# Patient Record
Sex: Female | Born: 1970
Health system: Southern US, Community
[De-identification: ages and names within clinical notes are randomized; demographics above are authoritative.]

## PROBLEM LIST (undated history)

## (undated) DIAGNOSIS — T7840XA Allergy, unspecified, initial encounter: Secondary | ICD-10-CM

## (undated) DIAGNOSIS — C50919 Malignant neoplasm of unspecified site of unspecified female breast: Secondary | ICD-10-CM

## (undated) DIAGNOSIS — N809 Endometriosis, unspecified: Secondary | ICD-10-CM

## (undated) DIAGNOSIS — Z923 Personal history of irradiation: Secondary | ICD-10-CM

## (undated) DIAGNOSIS — I10 Essential (primary) hypertension: Secondary | ICD-10-CM

## (undated) DIAGNOSIS — Z803 Family history of malignant neoplasm of breast: Secondary | ICD-10-CM

## (undated) DIAGNOSIS — N649 Disorder of breast, unspecified: Secondary | ICD-10-CM

## (undated) HISTORY — DX: Endometriosis, unspecified: N80.9

## (undated) HISTORY — DX: Malignant neoplasm of unspecified site of unspecified female breast: C50.919

## (undated) HISTORY — PX: BREAST SURGERY: SHX581

## (undated) HISTORY — DX: Essential (primary) hypertension: I10

## (undated) HISTORY — DX: Family history of malignant neoplasm of breast: Z80.3

## (undated) HISTORY — DX: Disorder of breast, unspecified: N64.9

## (undated) HISTORY — DX: Allergy, unspecified, initial encounter: T78.40XA

---

## 1997-07-20 HISTORY — PX: TUBAL LIGATION: SHX77

## 2007-09-12 ENCOUNTER — Other Ambulatory Visit: Admission: RE | Admit: 2007-09-12 | Discharge: 2007-09-12 | Payer: Self-pay | Admitting: Obstetrics and Gynecology

## 2010-11-28 ENCOUNTER — Other Ambulatory Visit (HOSPITAL_COMMUNITY)
Admission: RE | Admit: 2010-11-28 | Discharge: 2010-11-28 | Disposition: A | Discharge status: Home / Self Care | Payer: Self-pay | Source: Ambulatory Visit | Attending: Obstetrics & Gynecology | Admitting: Obstetrics & Gynecology

## 2010-11-28 ENCOUNTER — Other Ambulatory Visit: Payer: Self-pay | Admitting: Obstetrics & Gynecology

## 2010-11-28 DIAGNOSIS — Z01419 Encounter for gynecological examination (general) (routine) without abnormal findings: Secondary | ICD-10-CM | POA: Insufficient documentation

## 2011-02-16 ENCOUNTER — Encounter (INDEPENDENT_AMBULATORY_CARE_PROVIDER_SITE_OTHER): Payer: Self-pay | Admitting: *Deleted

## 2011-02-20 ENCOUNTER — Encounter (INDEPENDENT_AMBULATORY_CARE_PROVIDER_SITE_OTHER): Payer: Self-pay | Admitting: *Deleted

## 2011-03-31 ENCOUNTER — Encounter (INDEPENDENT_AMBULATORY_CARE_PROVIDER_SITE_OTHER): Payer: Self-pay | Admitting: *Deleted

## 2011-04-01 ENCOUNTER — Encounter (INDEPENDENT_AMBULATORY_CARE_PROVIDER_SITE_OTHER): Payer: Self-pay | Admitting: *Deleted

## 2011-04-02 ENCOUNTER — Encounter (INDEPENDENT_AMBULATORY_CARE_PROVIDER_SITE_OTHER): Payer: Self-pay | Admitting: *Deleted

## 2011-04-09 ENCOUNTER — Encounter (INDEPENDENT_AMBULATORY_CARE_PROVIDER_SITE_OTHER): Payer: Self-pay | Admitting: *Deleted

## 2011-04-20 ENCOUNTER — Encounter (INDEPENDENT_AMBULATORY_CARE_PROVIDER_SITE_OTHER): Payer: Self-pay | Admitting: *Deleted

## 2011-04-20 NOTE — Telephone Encounter (Signed)
This encounter was created in error - please disregard.

## 2011-04-22 ENCOUNTER — Encounter (INDEPENDENT_AMBULATORY_CARE_PROVIDER_SITE_OTHER): Payer: Self-pay | Admitting: *Deleted

## 2011-04-22 NOTE — Telephone Encounter (Signed)
This encounter was created in error - please disregard.

## 2011-04-23 ENCOUNTER — Encounter (INDEPENDENT_AMBULATORY_CARE_PROVIDER_SITE_OTHER): Payer: Self-pay | Admitting: *Deleted

## 2011-05-05 ENCOUNTER — Encounter (INDEPENDENT_AMBULATORY_CARE_PROVIDER_SITE_OTHER): Payer: Self-pay | Admitting: *Deleted

## 2011-05-05 NOTE — Progress Notes (Signed)
This encounter was created in error - please disregard.

## 2011-05-18 ENCOUNTER — Encounter (INDEPENDENT_AMBULATORY_CARE_PROVIDER_SITE_OTHER): Payer: Self-pay | Admitting: *Deleted

## 2011-05-22 ENCOUNTER — Encounter (INDEPENDENT_AMBULATORY_CARE_PROVIDER_SITE_OTHER): Payer: Self-pay | Admitting: *Deleted

## 2011-05-22 NOTE — Progress Notes (Signed)
This encounter was created in error - please disregard.

## 2011-05-27 ENCOUNTER — Encounter (INDEPENDENT_AMBULATORY_CARE_PROVIDER_SITE_OTHER): Payer: Self-pay | Admitting: *Deleted

## 2011-06-02 ENCOUNTER — Encounter (INDEPENDENT_AMBULATORY_CARE_PROVIDER_SITE_OTHER): Payer: Self-pay | Admitting: *Deleted

## 2011-06-02 NOTE — Telephone Encounter (Signed)
error    This encounter was created in error - please disregard.

## 2011-06-08 ENCOUNTER — Encounter (INDEPENDENT_AMBULATORY_CARE_PROVIDER_SITE_OTHER): Payer: Self-pay | Admitting: *Deleted

## 2011-06-15 ENCOUNTER — Encounter (INDEPENDENT_AMBULATORY_CARE_PROVIDER_SITE_OTHER): Payer: Self-pay | Admitting: *Deleted

## 2011-08-03 ENCOUNTER — Encounter (INDEPENDENT_AMBULATORY_CARE_PROVIDER_SITE_OTHER): Payer: Self-pay | Admitting: *Deleted

## 2011-08-03 NOTE — Progress Notes (Signed)
This encounter was created in error - please disregard.

## 2011-08-05 ENCOUNTER — Encounter (INDEPENDENT_AMBULATORY_CARE_PROVIDER_SITE_OTHER): Payer: Self-pay | Admitting: *Deleted

## 2011-08-05 NOTE — Telephone Encounter (Signed)
This encounter was created in error - please disregard.

## 2011-09-08 ENCOUNTER — Encounter (INDEPENDENT_AMBULATORY_CARE_PROVIDER_SITE_OTHER): Payer: Self-pay | Admitting: *Deleted

## 2011-09-14 ENCOUNTER — Encounter (INDEPENDENT_AMBULATORY_CARE_PROVIDER_SITE_OTHER): Payer: Self-pay | Admitting: *Deleted

## 2011-09-17 ENCOUNTER — Encounter (INDEPENDENT_AMBULATORY_CARE_PROVIDER_SITE_OTHER): Payer: Self-pay | Admitting: *Deleted

## 2011-11-12 ENCOUNTER — Encounter (INDEPENDENT_AMBULATORY_CARE_PROVIDER_SITE_OTHER): Payer: Self-pay | Admitting: *Deleted

## 2011-11-26 ENCOUNTER — Encounter (INDEPENDENT_AMBULATORY_CARE_PROVIDER_SITE_OTHER): Payer: Self-pay | Admitting: *Deleted

## 2011-11-27 ENCOUNTER — Encounter (INDEPENDENT_AMBULATORY_CARE_PROVIDER_SITE_OTHER): Payer: Self-pay | Admitting: *Deleted

## 2011-12-09 ENCOUNTER — Encounter (INDEPENDENT_AMBULATORY_CARE_PROVIDER_SITE_OTHER): Payer: Self-pay | Admitting: *Deleted

## 2012-01-11 ENCOUNTER — Encounter (INDEPENDENT_AMBULATORY_CARE_PROVIDER_SITE_OTHER): Payer: Self-pay | Admitting: *Deleted

## 2012-01-11 DIAGNOSIS — R109 Unspecified abdominal pain: Secondary | ICD-10-CM

## 2012-01-11 NOTE — Progress Notes (Signed)
This encounter was created in error - please disregard.

## 2012-01-13 ENCOUNTER — Encounter (INDEPENDENT_AMBULATORY_CARE_PROVIDER_SITE_OTHER): Payer: Self-pay | Admitting: Internal Medicine

## 2012-01-13 DIAGNOSIS — R140 Abdominal distension (gaseous): Secondary | ICD-10-CM

## 2012-01-13 NOTE — Progress Notes (Signed)
This encounter was created in error - please disregard.

## 2012-06-02 ENCOUNTER — Encounter (INDEPENDENT_AMBULATORY_CARE_PROVIDER_SITE_OTHER): Payer: Self-pay | Admitting: *Deleted

## 2012-06-20 ENCOUNTER — Encounter (INDEPENDENT_AMBULATORY_CARE_PROVIDER_SITE_OTHER): Payer: Self-pay | Admitting: *Deleted

## 2012-07-05 ENCOUNTER — Encounter (INDEPENDENT_AMBULATORY_CARE_PROVIDER_SITE_OTHER): Payer: Self-pay | Admitting: *Deleted

## 2012-07-06 ENCOUNTER — Encounter (INDEPENDENT_AMBULATORY_CARE_PROVIDER_SITE_OTHER): Payer: Self-pay | Admitting: *Deleted

## 2012-07-21 ENCOUNTER — Encounter (INDEPENDENT_AMBULATORY_CARE_PROVIDER_SITE_OTHER): Payer: Self-pay | Admitting: *Deleted

## 2012-07-27 ENCOUNTER — Encounter (INDEPENDENT_AMBULATORY_CARE_PROVIDER_SITE_OTHER): Payer: Self-pay | Admitting: *Deleted

## 2012-07-28 ENCOUNTER — Encounter (INDEPENDENT_AMBULATORY_CARE_PROVIDER_SITE_OTHER): Payer: Self-pay | Admitting: *Deleted

## 2012-08-04 ENCOUNTER — Encounter (INDEPENDENT_AMBULATORY_CARE_PROVIDER_SITE_OTHER): Payer: Self-pay | Admitting: *Deleted

## 2012-08-24 ENCOUNTER — Encounter (INDEPENDENT_AMBULATORY_CARE_PROVIDER_SITE_OTHER): Payer: Self-pay | Admitting: *Deleted

## 2012-08-29 ENCOUNTER — Encounter (INDEPENDENT_AMBULATORY_CARE_PROVIDER_SITE_OTHER): Payer: Self-pay | Admitting: *Deleted

## 2012-09-28 ENCOUNTER — Encounter (INDEPENDENT_AMBULATORY_CARE_PROVIDER_SITE_OTHER): Payer: Self-pay | Admitting: *Deleted

## 2012-10-04 ENCOUNTER — Encounter (INDEPENDENT_AMBULATORY_CARE_PROVIDER_SITE_OTHER): Payer: Self-pay | Admitting: *Deleted

## 2012-10-04 NOTE — Telephone Encounter (Signed)
This encounter was created in error - please disregard.

## 2012-10-31 ENCOUNTER — Encounter (INDEPENDENT_AMBULATORY_CARE_PROVIDER_SITE_OTHER): Payer: Self-pay | Admitting: *Deleted

## 2012-11-18 ENCOUNTER — Encounter (INDEPENDENT_AMBULATORY_CARE_PROVIDER_SITE_OTHER): Payer: Self-pay | Admitting: *Deleted

## 2012-11-23 ENCOUNTER — Encounter: Payer: Self-pay | Admitting: *Deleted

## 2012-11-23 DIAGNOSIS — N809 Endometriosis, unspecified: Secondary | ICD-10-CM

## 2012-11-23 DIAGNOSIS — I10 Essential (primary) hypertension: Secondary | ICD-10-CM

## 2012-11-24 ENCOUNTER — Encounter: Payer: Self-pay | Admitting: Obstetrics & Gynecology

## 2012-11-24 ENCOUNTER — Ambulatory Visit (INDEPENDENT_AMBULATORY_CARE_PROVIDER_SITE_OTHER): Payer: 59 | Admitting: Obstetrics & Gynecology

## 2012-11-24 VITALS — BP 140/80 | Ht 62.0 in | Wt 168.0 lb

## 2012-11-24 DIAGNOSIS — Z01419 Encounter for gynecological examination (general) (routine) without abnormal findings: Secondary | ICD-10-CM

## 2012-11-24 NOTE — Progress Notes (Signed)
Patient ID: Sara Freeman, female   DOB: 1970/08/04, 42 y.o.   MRN: [id number] Subjective:     Sara Freeman is a 42 y.o. female here for a routine exam.  Patient's last menstrual period was 11/09/2012. G1P1 Current complaints: none.  Personal health questionnaire reviewed: yes.   Gynecologic History Patient's last menstrual period was 11/09/2012. Contraception: tubal ligation Last Pap: 2013. Results were: normal Last mammogram: never. Results were: na  Obstetric History OB History   Grav Para Term Preterm Abortions TAB SAB Ect Mult Living   1 1             # Outc Date GA Lbr Len/2nd Wgt Sex Del Anes PTL Lv   1 PAR                The following portions of the patient's history were reviewed and updated as appropriate: allergies, current medications, past family history, past medical history, past social history, past surgical history and problem list.  Review of Systems  Review of Systems  Constitutional: Negative for fever, chills, weight loss, malaise/fatigue and diaphoresis.  HENT: Negative for hearing loss, ear pain, nosebleeds, congestion, sore throat, neck pain, tinnitus and ear discharge.   Eyes: Negative for blurred vision, double vision, photophobia, pain, discharge and redness.  Respiratory: Negative for cough, hemoptysis, sputum production, shortness of breath, wheezing and stridor.   Cardiovascular: Negative for chest pain, palpitations, orthopnea, claudication, leg swelling and PND.  Gastrointestinal: negative for abdominal pain. Negative for heartburn, nausea, vomiting, diarrhea, constipation, blood in stool and melena.  Genitourinary: Negative for dysuria, urgency, frequency, hematuria and flank pain.  Musculoskeletal: Negative for myalgias, back pain, joint pain and falls.  Skin: Negative for itching and rash.  Neurological: Negative for dizziness, tingling, tremors, sensory change, speech change, focal weakness, seizures, loss of consciousness, weakness and  headaches.  Endo/Heme/Allergies: Negative for environmental allergies and polydipsia. Does not bruise/bleed easily.  Psychiatric/Behavioral: Negative for depression, suicidal ideas, hallucinations, memory loss and substance abuse. The patient is not nervous/anxious and does not have insomnia.        Objective:    Physical Exam  Vitals reviewed. Constitutional: She is oriented to person, place, and time. She appears well-developed and well-nourished.  HENT:  Head: Normocephalic and atraumatic.        Right Ear: External ear normal.  Left Ear: External ear normal.  Nose: Nose normal.  Mouth/Throat: Oropharynx is clear and moist.  Eyes: Conjunctivae and EOM are normal. Pupils are equal, round, and reactive to light. Right eye exhibits no discharge. Left eye exhibits no discharge. No scleral icterus.  Neck: Normal range of motion. Neck supple. No tracheal deviation present. No thyromegaly present.  Cardiovascular: Normal rate, regular rhythm, normal heart sounds and intact distal pulses.  Exam reveals no gallop and no friction rub.   No murmur heard. Respiratory: Effort normal and breath sounds normal. No respiratory distress. She has no wheezes. She has no rales. She exhibits no tenderness.  GI: Soft. Bowel sounds are normal. She exhibits no distension and no mass. There is no tenderness. There is no rebound and no guarding.  Genitourinary:       Vulva is normal without lesions Vagina is pink moist without discharge Cervix normal in appearance and pap is done Uterus is normal size shape and contour Adnexa is negative with normal sized ovaries   Musculoskeletal: Normal range of motion. She exhibits no edema and no tenderness.  Neurological: She is alert and oriented to person,  place, and time. She has normal reflexes. She displays normal reflexes. No cranial nerve deficit. She exhibits normal muscle tone. Coordination normal.  Skin: Skin is warm and dry. No rash noted. No erythema. No  pallor.  Psychiatric: She has a normal mood and affect. Her behavior is normal. Judgment and thought content normal.       Assessment:    Healthy female exam.    Plan:    Mammogram ordered. Follow up in: 1 year.

## 2012-11-24 NOTE — Patient Instructions (Signed)
Mammography Mammography is an X-ray of the breasts to look for changes that are not normal. The X-ray image is called a mammogram. This procedure can screen for breast cancer, can detect cancer early, and can diagnose cancer.  LET YOUR CAREGIVER KNOW ABOUT:  Breast implants.  Previous breast disease, biopsy, or surgery.  If you are breastfeeding.  Medicines taken, including vitamins, herbs, eyedrops, over-the-counter medicines, and creams.  Use of steroids (by mouth or creams).  Possibility of pregnancy, if this applies. RISKS AND COMPLICATIONS  Exposure to radiation, but at very low levels.  The results may be misinterpreted.  The results may not be accurate.  Mammography may lead to further tests.  Mammography may not catch certain cancers. BEFORE THE PROCEDURE  Schedule your test about 7 days after your menstrual period. This is when your breasts are the least tender and have signs of hormone changes.  If you have had a mammography done at a different facility in the past, get the mammogram X-rays or have them sent to your current exam facility in order to compare them.  Wash your breasts and under your arms the day of the test.  Do not wear deodorants, perfumes, or powders anywhere on your body.  Wear clothes that you can change in and out of easily. PROCEDURE Relax as much as possible during the test. Any discomfort during the test will be very brief. The test should take less than 30 minutes. The following will happen:  You will undress from the waist up and put on a gown.  You will stand in front of the X-ray machine.  Each breast will be placed between 2 plastic or glass plates. The plates will compress your breast for a few seconds.  X-rays will be taken from different angles of the breast. AFTER THE PROCEDURE  The mammogram will be examined.  Depending on the quality of the images, you may need to repeat certain parts of the test.  Ask when your test  results will be ready. Make sure you get your test results.  You may resume normal activities. Document Released: 07/03/2000 Document Revised: 09/28/2011 Document Reviewed: 04/26/2011 [employer] Patient Information 2013 [employer]  

## 2013-05-25 ENCOUNTER — Other Ambulatory Visit: Payer: Self-pay

## 2013-06-20 ENCOUNTER — Telehealth (INDEPENDENT_AMBULATORY_CARE_PROVIDER_SITE_OTHER): Payer: Self-pay | Admitting: Internal Medicine

## 2013-06-20 DIAGNOSIS — J322 Chronic ethmoidal sinusitis: Secondary | ICD-10-CM

## 2013-06-20 MED ORDER — LEVOFLOXACIN 250 MG PO TABS: 250.0000 mg | ORAL_TABLET | Freq: Two times a day (BID) | ORAL | Status: DC

## 2013-06-20 NOTE — Telephone Encounter (Signed)
rx eprescribed  

## 2013-12-01 ENCOUNTER — Encounter: Payer: Self-pay | Admitting: Obstetrics & Gynecology

## 2013-12-01 ENCOUNTER — Ambulatory Visit (INDEPENDENT_AMBULATORY_CARE_PROVIDER_SITE_OTHER): Payer: 59 | Admitting: Obstetrics & Gynecology

## 2013-12-01 ENCOUNTER — Other Ambulatory Visit (HOSPITAL_COMMUNITY)
Admission: RE | Admit: 2013-12-01 | Discharge: 2013-12-01 | Disposition: A | Discharge status: Home / Self Care | Payer: 59 | Source: Ambulatory Visit | Attending: Obstetrics & Gynecology | Admitting: Obstetrics & Gynecology

## 2013-12-01 VITALS — BP 140/90 | Ht 62.0 in | Wt 170.0 lb

## 2013-12-01 DIAGNOSIS — Z01419 Encounter for gynecological examination (general) (routine) without abnormal findings: Secondary | ICD-10-CM | POA: Insufficient documentation

## 2013-12-01 DIAGNOSIS — Z1151 Encounter for screening for human papillomavirus (HPV): Secondary | ICD-10-CM | POA: Insufficient documentation

## 2013-12-01 NOTE — Progress Notes (Signed)
Patient ID: Sara Freeman, female   DOB: [birth year], 43 y.o.   MRN: [id number] Subjective:     Sara Freeman is a 43 y.o. female here for a routine exam.  Patient's last menstrual period was 11/08/2013. G1P1 Birth Control Method:  BTL Menstrual Calendar(currently): regular, no problems  Current complaints: none.   Current acute medical issues:  none   Recent Gynecologic History Patient's last menstrual period was 11/08/2013. Last Pap: 2014,  normal Last mammogram: never,    Past Medical History  Diagnosis Date  . Endometriosis   . Hypertension     Past Surgical History  Procedure Laterality Date  . Tubal ligation  1999    [facility]    OB History   Grav Para Term Preterm Abortions TAB SAB Ect Mult Living   1 1              History   Social History  . Marital Status: Married    Spouse Name: N/A    Number of Children: N/A  . Years of Education: N/A   Social History Main Topics  . Smoking status: Former Research scientist (life sciences)  . Smokeless tobacco: None  . Alcohol Use: None  . Drug Use: None  . Sexual Activity: Yes   Other Topics Concern  . None   Social History Narrative  . None    Family History  Problem Relation Age of Onset  . CAD Other   . Hypertension Other   . Stroke Other   . Thyroid disease Other   . Heart disease Other      Review of Systems  Review of Systems  Constitutional: Negative for fever, chills, weight loss, malaise/fatigue and diaphoresis.  HENT: Negative for hearing loss, ear pain, nosebleeds, congestion, sore throat, neck pain, tinnitus and ear discharge.   Eyes: Negative for blurred vision, double vision, photophobia, pain, discharge and redness.  Respiratory: Negative for cough, hemoptysis, sputum production, shortness of breath, wheezing and stridor.   Cardiovascular: Negative for chest pain, palpitations, orthopnea, claudication, leg swelling and PND.  Gastrointestinal: negative for abdominal pain. Negative for heartburn, nausea, vomiting,  diarrhea, constipation, blood in stool and melena.  Genitourinary: Negative for dysuria, urgency, frequency, hematuria and flank pain.  Musculoskeletal: Negative for myalgias, back pain, joint pain and falls.  Skin: Negative for itching and rash.  Neurological: Negative for dizziness, tingling, tremors, sensory change, speech change, focal weakness, seizures, loss of consciousness, weakness and headaches.  Endo/Heme/Allergies: Negative for environmental allergies and polydipsia. Does not bruise/bleed easily.  Psychiatric/Behavioral: Negative for depression, suicidal ideas, hallucinations, memory loss and substance abuse. The patient is not nervous/anxious and does not have insomnia.        Objective:    Physical Exam  Vitals reviewed. Constitutional: She is oriented to person, place, and time. She appears well-developed and well-nourished.  HENT:  Head: Normocephalic and atraumatic.        Right Ear: External ear normal.  Left Ear: External ear normal.  Nose: Nose normal.  Mouth/Throat: Oropharynx is clear and moist.  Eyes: Conjunctivae and EOM are normal. Pupils are equal, round, and reactive to light. Right eye exhibits no discharge. Left eye exhibits no discharge. No scleral icterus.  Neck: Normal range of motion. Neck supple. No tracheal deviation present. No thyromegaly present.  Cardiovascular: Normal rate, regular rhythm, normal heart sounds and intact distal pulses.  Exam reveals no gallop and no friction rub.   No murmur heard. Respiratory: Effort normal and breath sounds normal. No respiratory distress.  She has no wheezes. She has no rales. She exhibits no tenderness.  GI: Soft. Bowel sounds are normal. She exhibits no distension and no mass. There is no tenderness. There is no rebound and no guarding.  Genitourinary:  Breasts no masses skin changes or nipple changes bilaterally      Vulva is normal without lesions Vagina is pink moist without discharge Cervix normal in  appearance and pap is done Uterus is normal size shape and contour Adnexa is negative with normal sized ovaries   Musculoskeletal: Normal range of motion. She exhibits no edema and no tenderness.  Neurological: She is alert and oriented to person, place, and time. She has normal reflexes. She displays normal reflexes. No cranial nerve deficit. She exhibits normal muscle tone. Coordination normal.  Skin: Skin is warm and dry. No rash noted. No erythema. No pallor.  Psychiatric: She has a normal mood and affect. Her behavior is normal. Judgment and thought content normal.       Assessment:    Healthy female exam.    Plan:    Mammogram ordered. Follow up in: 1 year.

## 2014-05-04 ENCOUNTER — Other Ambulatory Visit: Payer: Self-pay

## 2014-05-21 ENCOUNTER — Encounter: Payer: Self-pay | Admitting: Obstetrics & Gynecology

## 2014-08-13 ENCOUNTER — Telehealth (INDEPENDENT_AMBULATORY_CARE_PROVIDER_SITE_OTHER): Payer: Self-pay | Admitting: Internal Medicine

## 2014-08-13 DIAGNOSIS — J322 Chronic ethmoidal sinusitis: Secondary | ICD-10-CM

## 2014-08-13 MED ORDER — LEVOFLOXACIN 250 MG PO TABS: 250.0000 mg | ORAL_TABLET | Freq: Two times a day (BID) | ORAL | Status: DC

## 2014-08-13 NOTE — Telephone Encounter (Signed)
Rx for Levaquin sent to pharmacy

## 2014-12-21 ENCOUNTER — Encounter: Payer: Self-pay | Admitting: Obstetrics & Gynecology

## 2014-12-21 ENCOUNTER — Ambulatory Visit (INDEPENDENT_AMBULATORY_CARE_PROVIDER_SITE_OTHER): Payer: 59 | Admitting: Obstetrics & Gynecology

## 2014-12-21 ENCOUNTER — Other Ambulatory Visit (HOSPITAL_COMMUNITY)
Admission: RE | Admit: 2014-12-21 | Discharge: 2014-12-21 | Disposition: A | Discharge status: Home / Self Care | Payer: 59 | Source: Ambulatory Visit | Attending: Obstetrics & Gynecology | Admitting: Obstetrics & Gynecology

## 2014-12-21 VITALS — BP 140/90 | HR 76 | Ht 62.0 in | Wt 172.0 lb

## 2014-12-21 DIAGNOSIS — Z01419 Encounter for gynecological examination (general) (routine) without abnormal findings: Secondary | ICD-10-CM | POA: Insufficient documentation

## 2014-12-21 NOTE — Progress Notes (Signed)
Patient ID: [name], female   DOB: [birth year], 44 y.o.   MRN: [id number] Subjective:     Sara Freeman is a 44 y.o. female here for a routine exam.  Patient's last menstrual period was 12/01/2014. G1P1 Birth Control Method:  BTL Menstrual Calendar(currently): regular  Current complaints: none.   Current acute medical issues:  none   Recent Gynecologic History Patient's last menstrual period was 12/01/2014. Last Pap: 2015,  normal Last mammogram: never,    Past Medical History  Diagnosis Date  . Endometriosis   . Hypertension     Past Surgical History  Procedure Laterality Date  . Tubal ligation  1999    [facility]    OB History    Gravida Para Term Preterm AB TAB SAB Ectopic Multiple Living   1 1              History   Social History  . Marital Status: Married    Spouse Name: N/A  . Number of Children: N/A  . Years of Education: N/A   Social History Main Topics  . Smoking status: Former Research scientist (life sciences)  . Smokeless tobacco: Not on file  . Alcohol Use: Not on file  . Drug Use: Not on file  . Sexual Activity: Yes   Other Topics Concern  . None   Social History Narrative    Family History  Problem Relation Age of Onset  . CAD Other   . Hypertension Other   . Stroke Other   . Thyroid disease Other   . Heart disease Other      Current outpatient prescriptions:  .  levofloxacin (LEVAQUIN) 250 MG tablet, Take 1 tablet (250 mg total) by mouth 2 (two) times daily before a meal. (Patient not taking: Reported on 12/21/2014), Disp: 20 tablet, Rfl: 0  Review of Systems  Review of Systems  Constitutional: Negative for fever, chills, weight loss, malaise/fatigue and diaphoresis.  HENT: Negative for hearing loss, ear pain, nosebleeds, congestion, sore throat, neck pain, tinnitus and ear discharge.   Eyes: Negative for blurred vision, double vision, photophobia, pain, discharge and redness.  Respiratory: Negative for cough, hemoptysis, sputum production, shortness of  breath, wheezing and stridor.   Cardiovascular: Negative for chest pain, palpitations, orthopnea, claudication, leg swelling and PND.  Gastrointestinal: negative for abdominal pain. Negative for heartburn, nausea, vomiting, diarrhea, constipation, blood in stool and melena.  Genitourinary: Negative for dysuria, urgency, frequency, hematuria and flank pain.  Musculoskeletal: Negative for myalgias, back pain, joint pain and falls.  Skin: Negative for itching and rash.  Neurological: Negative for dizziness, tingling, tremors, sensory change, speech change, focal weakness, seizures, loss of consciousness, weakness and headaches.  Endo/Heme/Allergies: Negative for environmental allergies and polydipsia. Does not bruise/bleed easily.  Psychiatric/Behavioral: Negative for depression, suicidal ideas, hallucinations, memory loss and substance abuse. The patient is not nervous/anxious and does not have insomnia.        Objective:  Blood pressure 140/90, pulse 76, height 5\' 2"  (1.575 m), weight 172 lb (78.019 kg), last menstrual period 12/01/2014.   Physical Exam  Vitals reviewed. Constitutional: She is oriented to person, place, and time. She appears well-developed and well-nourished.  HENT:  Head: Normocephalic and atraumatic.        Right Ear: External ear normal.  Left Ear: External ear normal.  Nose: Nose normal.  Mouth/Throat: Oropharynx is clear and moist.  Eyes: Conjunctivae and EOM are normal. Pupils are equal, round, and reactive to light. Right eye exhibits no discharge. Left eye  exhibits no discharge. No scleral icterus.  Neck: Normal range of motion. Neck supple. No tracheal deviation present. No thyromegaly present.  Cardiovascular: Normal rate, regular rhythm, normal heart sounds and intact distal pulses.  Exam reveals no gallop and no friction rub.   No murmur heard. Respiratory: Effort normal and breath sounds normal. No respiratory distress. She has no wheezes. She has no rales. She  exhibits no tenderness.  GI: Soft. Bowel sounds are normal. She exhibits no distension and no mass. There is no tenderness. There is no rebound and no guarding.  Genitourinary:  Breasts no masses skin changes or nipple changes bilaterally      Vulva is normal without lesions Vagina is pink moist without discharge Cervix normal in appearance and pap is done Uterus is normal size shape and contour Adnexa is negative with normal sized ovaries   Musculoskeletal: Normal range of motion. She exhibits no edema and no tenderness.  Neurological: She is alert and oriented to person, place, and time. She has normal reflexes. She displays normal reflexes. No cranial nerve deficit. She exhibits normal muscle tone. Coordination normal.  Skin: Skin is warm and dry. No rash noted. No erythema. No pallor.  Psychiatric: She has a normal mood and affect. Her behavior is normal. Judgment and thought content normal.       Assessment:    Healthy female exam.    Plan:    Contraception: tubal ligation. Mammogram ordered. Follow up in: 1 year.

## 2014-12-24 LAB — CYTOLOGY - PAP

## 2015-07-21 HISTORY — PX: BREAST BIOPSY: SHX20

## 2015-08-09 DIAGNOSIS — H5213 Myopia, bilateral: Secondary | ICD-10-CM | POA: Diagnosis not present

## 2015-11-20 ENCOUNTER — Encounter: Payer: Self-pay | Admitting: Obstetrics & Gynecology

## 2015-12-27 ENCOUNTER — Ambulatory Visit (INDEPENDENT_AMBULATORY_CARE_PROVIDER_SITE_OTHER): Payer: 59 | Admitting: Obstetrics & Gynecology

## 2015-12-27 ENCOUNTER — Other Ambulatory Visit (HOSPITAL_COMMUNITY)
Admission: RE | Admit: 2015-12-27 | Discharge: 2015-12-27 | Disposition: A | Discharge status: Home / Self Care | Payer: 59 | Source: Ambulatory Visit | Attending: Obstetrics & Gynecology | Admitting: Obstetrics & Gynecology

## 2015-12-27 ENCOUNTER — Other Ambulatory Visit: Payer: Self-pay | Admitting: Obstetrics & Gynecology

## 2015-12-27 ENCOUNTER — Encounter: Payer: Self-pay | Admitting: Obstetrics & Gynecology

## 2015-12-27 VITALS — BP 130/90 | HR 78 | Ht 62.0 in | Wt 166.0 lb

## 2015-12-27 DIAGNOSIS — Z01419 Encounter for gynecological examination (general) (routine) without abnormal findings: Secondary | ICD-10-CM | POA: Insufficient documentation

## 2015-12-27 DIAGNOSIS — Z1231 Encounter for screening mammogram for malignant neoplasm of breast: Secondary | ICD-10-CM

## 2015-12-27 NOTE — Progress Notes (Signed)
Patient ID: Sara Freeman, female   DOB: [birth year], 46 y.o.   MRN: [id number] Subjective:     Sara Freeman is a 45 y.o. female here for a routine exam.  Patient's last menstrual period was 12/02/2015. G1P1 Birth Control Method:  BTL Menstrual Calendar(currently): regular  Current complaints: none.   Current acute medical issues:  none   Recent Gynecologic History Patient's last menstrual period was 12/02/2015. Last Pap: 2016,  normal Last mammogram: never,    Past Medical History  Diagnosis Date  . Endometriosis   . Hypertension     Past Surgical History  Procedure Laterality Date  . Tubal ligation  1999    [facility]    OB History    Gravida Para Term Preterm AB TAB SAB Ectopic Multiple Living   1 1              Social History   Social History  . Marital Status: Married    Spouse Name: N/A  . Number of Children: N/A  . Years of Education: N/A   Social History Main Topics  . Smoking status: Former Research scientist (life sciences)  . Smokeless tobacco: None  . Alcohol Use: None  . Drug Use: None  . Sexual Activity: Yes   Other Topics Concern  . None   Social History Narrative    Family History  Problem Relation Age of Onset  . CAD Other   . Hypertension Other   . Stroke Other   . Thyroid disease Other   . Heart disease Other     No current outpatient prescriptions on file.  Review of Systems  Review of Systems  Constitutional: Negative for fever, chills, weight loss, malaise/fatigue and diaphoresis.  HENT: Negative for hearing loss, ear pain, nosebleeds, congestion, sore throat, neck pain, tinnitus and ear discharge.   Eyes: Negative for blurred vision, double vision, photophobia, pain, discharge and redness.  Respiratory: Negative for cough, hemoptysis, sputum production, shortness of breath, wheezing and stridor.   Cardiovascular: Negative for chest pain, palpitations, orthopnea, claudication, leg swelling and PND.  Gastrointestinal: negative for abdominal pain. Negative  for heartburn, nausea, vomiting, diarrhea, constipation, blood in stool and melena.  Genitourinary: Negative for dysuria, urgency, frequency, hematuria and flank pain.  Musculoskeletal: Negative for myalgias, back pain, joint pain and falls.  Skin: Negative for itching and rash.  Neurological: Negative for dizziness, tingling, tremors, sensory change, speech change, focal weakness, seizures, loss of consciousness, weakness and headaches.  Endo/Heme/Allergies: Negative for environmental allergies and polydipsia. Does not bruise/bleed easily.  Psychiatric/Behavioral: Negative for depression, suicidal ideas, hallucinations, memory loss and substance abuse. The patient is not nervous/anxious and does not have insomnia.        Objective:  Blood pressure 130/90, pulse 78, height 5\' 2"  (1.575 m), weight 166 lb (75.297 kg), last menstrual period 12/02/2015.   Physical Exam  Vitals reviewed. Constitutional: She is oriented to person, place, and time. She appears well-developed and well-nourished.  HENT:  Head: Normocephalic and atraumatic.        Right Ear: External ear normal.  Left Ear: External ear normal.  Nose: Nose normal.  Mouth/Throat: Oropharynx is clear and moist.  Eyes: Conjunctivae and EOM are normal. Pupils are equal, round, and reactive to light. Right eye exhibits no discharge. Left eye exhibits no discharge. No scleral icterus.  Neck: Normal range of motion. Neck supple. No tracheal deviation present. No thyromegaly present.  Cardiovascular: Normal rate, regular rhythm, normal heart sounds and intact distal pulses.  Exam reveals  no gallop and no friction rub.   No murmur heard. Respiratory: Effort normal and breath sounds normal. No respiratory distress. She has no wheezes. She has no rales. She exhibits no tenderness.  GI: Soft. Bowel sounds are normal. She exhibits no distension and no mass. There is no tenderness. There is no rebound and no guarding.  Genitourinary:  Breasts no  masses skin changes or nipple changes bilaterally      Vulva is normal without lesions Vagina is pink moist without discharge Cervix normal in appearance and pap is done Uterus is normal size shape and contour Adnexa is negative with normal sized ovaries   Musculoskeletal: Normal range of motion. She exhibits no edema and no tenderness.  Neurological: She is alert and oriented to person, place, and time. She has normal reflexes. She displays normal reflexes. No cranial nerve deficit. She exhibits normal muscle tone. Coordination normal.  Skin: Skin is warm and dry. No rash noted. No erythema. No pallor.  Psychiatric: She has a normal mood and affect. Her behavior is normal. Judgment and thought content normal.       Medications Ordered at today's visit: No orders of the defined types were placed in this encounter.    Other orders placed at today's visit: Orders Placed This Encounter  Procedures  . MM Digital Screening      Assessment:    Healthy female exam.    Plan:    Contraception: tubal ligation. Mammogram ordered. Follow up in: 1 years.     Return in about 1 year (around 12/26/2016) for yearly, with Dr Elonda Husky.

## 2015-12-31 LAB — CYTOLOGY - PAP

## 2016-01-02 ENCOUNTER — Telehealth (INDEPENDENT_AMBULATORY_CARE_PROVIDER_SITE_OTHER): Payer: Self-pay | Admitting: Internal Medicine

## 2016-01-02 NOTE — Telephone Encounter (Deleted)
From Subject Received Size Categories  Bensimhon, Sara Freeman: [facility] Riding 04/22/1955 Wed 11:43 AM 20 KB   She is very tenuous but can place capsule as long as no need for general anesthesia   Sent from my iPhone  On Jan 01, 2016, at 10:23 AM, Vaughan Basta, Lateefah Mallery [email or web address] wrote: Sara Freeman has hx of GI bleed.  Dr.Rehman needs for Gladys to have clearance from you before he proceeds with a Given Capsule.

## 2016-01-09 ENCOUNTER — Telehealth (INDEPENDENT_AMBULATORY_CARE_PROVIDER_SITE_OTHER): Payer: Self-pay | Admitting: Internal Medicine

## 2016-01-10 ENCOUNTER — Ambulatory Visit (HOSPITAL_COMMUNITY)
Admission: RE | Admit: 2016-01-10 | Discharge: 2016-01-10 | Disposition: A | Discharge status: Home / Self Care | Payer: 59 | Source: Ambulatory Visit | Attending: Obstetrics & Gynecology | Admitting: Obstetrics & Gynecology

## 2016-01-10 DIAGNOSIS — R921 Mammographic calcification found on diagnostic imaging of breast: Secondary | ICD-10-CM | POA: Diagnosis not present

## 2016-01-10 DIAGNOSIS — Z1231 Encounter for screening mammogram for malignant neoplasm of breast: Secondary | ICD-10-CM | POA: Diagnosis not present

## 2016-01-10 NOTE — Telephone Encounter (Signed)
error 

## 2016-01-17 ENCOUNTER — Other Ambulatory Visit: Payer: Self-pay | Admitting: Obstetrics & Gynecology

## 2016-01-17 DIAGNOSIS — R928 Other abnormal and inconclusive findings on diagnostic imaging of breast: Secondary | ICD-10-CM

## 2016-01-24 ENCOUNTER — Encounter: Payer: Self-pay | Admitting: Obstetrics & Gynecology

## 2016-01-27 ENCOUNTER — Other Ambulatory Visit (HOSPITAL_COMMUNITY): Payer: Self-pay | Admitting: Obstetrics & Gynecology

## 2016-01-27 DIAGNOSIS — R928 Other abnormal and inconclusive findings on diagnostic imaging of breast: Secondary | ICD-10-CM

## 2016-01-28 ENCOUNTER — Other Ambulatory Visit: Payer: Self-pay | Admitting: Obstetrics & Gynecology

## 2016-01-28 ENCOUNTER — Ambulatory Visit (HOSPITAL_COMMUNITY)
Admission: RE | Admit: 2016-01-28 | Discharge: 2016-01-28 | Disposition: A | Discharge status: Home / Self Care | Payer: 59 | Source: Ambulatory Visit | Attending: Obstetrics & Gynecology | Admitting: Obstetrics & Gynecology

## 2016-01-28 DIAGNOSIS — R921 Mammographic calcification found on diagnostic imaging of breast: Secondary | ICD-10-CM | POA: Diagnosis not present

## 2016-01-28 DIAGNOSIS — R928 Other abnormal and inconclusive findings on diagnostic imaging of breast: Secondary | ICD-10-CM | POA: Diagnosis present

## 2016-01-31 ENCOUNTER — Ambulatory Visit
Admission: RE | Admit: 2016-01-31 | Discharge: 2016-01-31 | Disposition: A | Discharge status: Home / Self Care | Payer: 59 | Source: Ambulatory Visit | Attending: Obstetrics & Gynecology | Admitting: Obstetrics & Gynecology

## 2016-01-31 DIAGNOSIS — R921 Mammographic calcification found on diagnostic imaging of breast: Secondary | ICD-10-CM

## 2016-01-31 DIAGNOSIS — N6012 Diffuse cystic mastopathy of left breast: Secondary | ICD-10-CM | POA: Diagnosis not present

## 2016-02-04 ENCOUNTER — Encounter (HOSPITAL_COMMUNITY): Payer: 59

## 2016-06-14 ENCOUNTER — Telehealth: Payer: 59 | Admitting: Family

## 2016-06-14 DIAGNOSIS — N3000 Acute cystitis without hematuria: Secondary | ICD-10-CM

## 2016-06-14 MED ORDER — NITROFURANTOIN MONOHYD MACRO 100 MG PO CAPS: 100.0000 mg | ORAL_CAPSULE | Freq: Two times a day (BID) | ORAL | 0 refills | Status: DC

## 2016-06-14 NOTE — Progress Notes (Signed)

## 2016-09-18 DIAGNOSIS — H5213 Myopia, bilateral: Secondary | ICD-10-CM | POA: Diagnosis not present

## 2016-12-31 ENCOUNTER — Other Ambulatory Visit: Payer: Self-pay | Admitting: Obstetrics & Gynecology

## 2016-12-31 DIAGNOSIS — Z1231 Encounter for screening mammogram for malignant neoplasm of breast: Secondary | ICD-10-CM

## 2017-01-15 ENCOUNTER — Other Ambulatory Visit: Payer: 59 | Admitting: Obstetrics & Gynecology

## 2017-02-05 ENCOUNTER — Ambulatory Visit (INDEPENDENT_AMBULATORY_CARE_PROVIDER_SITE_OTHER): Payer: 59 | Admitting: Obstetrics & Gynecology

## 2017-02-05 ENCOUNTER — Ambulatory Visit
Admission: RE | Admit: 2017-02-05 | Discharge: 2017-02-05 | Disposition: A | Discharge status: Home / Self Care | Payer: 59 | Source: Ambulatory Visit | Attending: Obstetrics & Gynecology | Admitting: Obstetrics & Gynecology

## 2017-02-05 ENCOUNTER — Encounter: Payer: Self-pay | Admitting: Obstetrics & Gynecology

## 2017-02-05 ENCOUNTER — Other Ambulatory Visit (HOSPITAL_COMMUNITY)
Admission: RE | Admit: 2017-02-05 | Discharge: 2017-02-05 | Disposition: A | Discharge status: Home / Self Care | Payer: 59 | Source: Ambulatory Visit | Attending: Obstetrics & Gynecology | Admitting: Obstetrics & Gynecology

## 2017-02-05 VITALS — BP 152/88 | HR 84 | Ht 61.5 in | Wt 167.0 lb

## 2017-02-05 DIAGNOSIS — Z01419 Encounter for gynecological examination (general) (routine) without abnormal findings: Secondary | ICD-10-CM | POA: Insufficient documentation

## 2017-02-05 DIAGNOSIS — Z1231 Encounter for screening mammogram for malignant neoplasm of breast: Secondary | ICD-10-CM

## 2017-02-05 NOTE — Progress Notes (Signed)
Subjective:     Sara Freeman is a 46 y.o. female here for a routine exam.  Patient's last menstrual period was 01/14/2017. G1P1001 Birth Control Method:  Tubal ligation Menstrual Calendar(currently): regular monthly lasts 1 week  Current complaints: none.   Current acute medical issues:  none   Recent Gynecologic History Patient's last menstrual period was 01/14/2017. Last Pap: 2017,  normal Last mammogram: today,  penign  Past Medical History:  Diagnosis Date  . Endometriosis   . Hypertension     Past Surgical History:  Procedure Laterality Date  . TUBAL LIGATION  1999   [facility]    OB History    Gravida Para Term Preterm AB Living   1 1 1     1    SAB TAB Ectopic Multiple Live Births           1      Social History   Social History  . Marital status: Married    Spouse name: N/A  . Number of children: N/A  . Years of education: N/A   Social History Main Topics  . Smoking status: Former Smoker    Types: Cigarettes  . Smokeless tobacco: Never Used  . Alcohol use No  . Drug use: No  . Sexual activity: Yes    Birth control/ protection: Surgical     Comment: tubal   Other Topics Concern  . None   Social History Narrative  . None    Family History  Problem Relation Age of Onset  . CAD Other   . Heart disease Other   . Stroke Paternal Grandfather   . Stroke Paternal Grandmother   . Aneurysm Maternal Grandfather   . Heart attack Father   . Hypertension Father   . Thyroid disease Mother   . Hypertension Mother   . Hypertension Brother   . Stroke Paternal Uncle     No current outpatient prescriptions on file.  Review of Systems  Review of Systems  Constitutional: Negative for fever, chills, weight loss, malaise/fatigue and diaphoresis.  HENT: Negative for hearing loss, ear pain, nosebleeds, congestion, sore throat, neck pain, tinnitus and ear discharge.   Eyes: Negative for blurred vision, double vision, photophobia, pain, discharge and redness.   Respiratory: Negative for cough, hemoptysis, sputum production, shortness of breath, wheezing and stridor.   Cardiovascular: Negative for chest pain, palpitations, orthopnea, claudication, leg swelling and PND.  Gastrointestinal: negative for abdominal pain. Negative for heartburn, nausea, vomiting, diarrhea, constipation, blood in stool and melena.  Genitourinary: Negative for dysuria, urgency, frequency, hematuria and flank pain.  Musculoskeletal: Negative for myalgias, back pain, joint pain and falls.  Skin: Negative for itching and rash.  Neurological: Negative for dizziness, tingling, tremors, sensory change, speech change, focal weakness, seizures, loss of consciousness, weakness and headaches.  Endo/Heme/Allergies: Negative for environmental allergies and polydipsia. Does not bruise/bleed easily.  Psychiatric/Behavioral: Negative for depression, suicidal ideas, hallucinations, memory loss and substance abuse. The patient is not nervous/anxious and does not have insomnia.        Objective:  Blood pressure (!) 162/100, pulse 84, height 5' 1.5" (1.562 m), weight 167 lb (75.8 kg), last menstrual period 01/14/2017.   Physical Exam  Vitals reviewed. Constitutional: She is oriented to person, place, and time. She appears well-developed and well-nourished.  HENT:  Head: Normocephalic and atraumatic.        Right Ear: External ear normal.  Left Ear: External ear normal.  Nose: Nose normal.  Mouth/Throat: Oropharynx is clear and moist.  Eyes: Conjunctivae and EOM are normal. Pupils are equal, round, and reactive to light. Right eye exhibits no discharge. Left eye exhibits no discharge. No scleral icterus.  Neck: Normal range of motion. Neck supple. No tracheal deviation present. No thyromegaly present.  Cardiovascular: Normal rate, regular rhythm, normal heart sounds and intact distal pulses.  Exam reveals no gallop and no friction rub.   No murmur heard. Respiratory: Effort normal and  breath sounds normal. No respiratory distress. She has no wheezes. She has no rales. She exhibits no tenderness.  GI: Soft. Bowel sounds are normal. She exhibits no distension and no mass. There is no tenderness. There is no rebound and no guarding.  Genitourinary:  Breasts no masses skin changes or nipple changes bilaterally      Vulva is normal without lesions Vagina is pink moist without discharge Cervix normal in appearance and pap is done Uterus is normal size shape and contour Adnexa is negative with normal sized ovaries   Musculoskeletal: Normal range of motion. She exhibits no edema and no tenderness.  Neurological: She is alert and oriented to person, place, and time. She has normal reflexes. She displays normal reflexes. No cranial nerve deficit. She exhibits normal muscle tone. Coordination normal.  Skin: Skin is warm and dry. No rash noted. No erythema. No pallor.  Psychiatric: She has a normal mood and affect. Her behavior is normal. Judgment and thought content normal.       Medications Ordered at today's visit: No orders of the defined types were placed in this encounter.   Other orders placed at today's visit: No orders of the defined types were placed in this encounter.     Assessment:    Healthy female exam.    Plan:    Contraception: tubal ligation.     No Follow-up on file.

## 2017-02-09 ENCOUNTER — Other Ambulatory Visit: Payer: Self-pay | Admitting: Obstetrics & Gynecology

## 2017-02-09 DIAGNOSIS — R928 Other abnormal and inconclusive findings on diagnostic imaging of breast: Secondary | ICD-10-CM

## 2017-02-10 LAB — CYTOLOGY - PAP
Diagnosis: NEGATIVE
HPV: NOT DETECTED

## 2017-02-12 ENCOUNTER — Ambulatory Visit
Admission: RE | Admit: 2017-02-12 | Discharge: 2017-02-12 | Disposition: A | Discharge status: Home / Self Care | Payer: 59 | Source: Ambulatory Visit | Attending: Obstetrics & Gynecology | Admitting: Obstetrics & Gynecology

## 2017-02-12 ENCOUNTER — Other Ambulatory Visit: Payer: Self-pay | Admitting: Obstetrics & Gynecology

## 2017-02-12 DIAGNOSIS — R922 Inconclusive mammogram: Secondary | ICD-10-CM | POA: Diagnosis not present

## 2017-02-12 DIAGNOSIS — R921 Mammographic calcification found on diagnostic imaging of breast: Secondary | ICD-10-CM

## 2017-02-12 DIAGNOSIS — R928 Other abnormal and inconclusive findings on diagnostic imaging of breast: Secondary | ICD-10-CM

## 2017-05-28 ENCOUNTER — Other Ambulatory Visit: Payer: Self-pay

## 2017-05-28 ENCOUNTER — Ambulatory Visit (INDEPENDENT_AMBULATORY_CARE_PROVIDER_SITE_OTHER): Payer: 59 | Admitting: Family Medicine

## 2017-05-28 ENCOUNTER — Encounter: Payer: Self-pay | Admitting: Family Medicine

## 2017-05-28 VITALS — BP 168/94 | HR 88 | Temp 97.8°F | Resp 16 | Ht 62.0 in | Wt 174.0 lb

## 2017-05-28 DIAGNOSIS — I10 Essential (primary) hypertension: Secondary | ICD-10-CM | POA: Diagnosis not present

## 2017-05-28 DIAGNOSIS — Z8249 Family history of ischemic heart disease and other diseases of the circulatory system: Secondary | ICD-10-CM

## 2017-05-28 HISTORY — DX: Family history of ischemic heart disease and other diseases of the circulatory system: Z82.49

## 2017-05-28 MED ORDER — CHLORTHALIDONE 25 MG PO TABS: 25.0000 mg | ORAL_TABLET | Freq: Every day | ORAL | 3 refills | Status: DC

## 2017-05-28 NOTE — Progress Notes (Signed)
Chief Complaint  Patient presents with  . Hypertension   This is the first medical visit for this patient.  She has not seen a primary care doctor for many years.  She is good about getting a yearly GYN visit.  She is up-to-date with Pap smears and mammograms.  She is up-to-date with her immunizations.  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 She is here for new onset of hypertension.  This was identified by her GYN.  Review of her chart indicates that she has had elevated blood pressure for several years.  She has hypertension in her family.  She also has a family history of heart disease at a young age.  She does not think she is ever had her cholesterol or sugar checked.  Her blood pressure has been in the 140/90 to 160/100 range the last few times checked.  She denies any headaches, visual symptoms, or sequelae from the elevated blood pressure. She has no other ongoing medical problems.  She takes no prescription medications.   Patient Active Problem List   Diagnosis Date Noted  . Family history of heart disease in female family member before age 92 05/28/2017  . Endometriosis 11/23/2012  . HTN (hypertension) 11/23/2012    Outpatient Encounter Medications as of 05/28/2017  Medication Sig  . chlorthalidone (HYGROTON) 25 MG tablet Take 1 tablet (25 mg total) daily by mouth.   No facility-administered encounter medications on file as of 05/28/2017.     Past Medical History:  Diagnosis Date  . Allergy   . Endometriosis   . Hypertension     Past Surgical History:  Procedure Laterality Date  . BREAST BIOPSY Left 2017   benign  . TUBAL LIGATION  1999   [facility]    Social History   Socioeconomic History  . Marital status: Married    Spouse name: [name]  . Number of children: 1  . Years of education: 53  . Highest education level: Associate degree: academic program  Social Needs  . Financial resource strain: Not hard at all  .  Food insecurity - worry: Never true  . Food insecurity - inability: Never true  . Transportation needs - medical: No  . Transportation needs - non-medical: No  Occupational History  . 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 Smoking status: Former Smoker    Packs/day: 1.00    Types: Cigarettes    Start date: 07/20/1986    Last attempt to quit: 07/20/2006    Years since quitting: 10.8  . Smokeless tobacco: Never Used  Substance and Sexual Activity  . Alcohol use: No  . Drug use: No  . Sexual activity: Yes    Birth control/protection: Surgical    Comment: tubal  Other Topics Concern  . Not on file  Social History Narrative   Lives with husband Kandra Nicolas is at home but spends  Time with his GF   Has 2 dachshund   Likes to read   Goes camping once a month    Family History  Problem Relation Age of Onset  . CAD Other   . Heart disease Other   . Stroke Paternal Grandfather   . Stroke Paternal Grandmother   . Aneurysm Maternal Grandfather   . Heart attack Father   . Hypertension Father   . COPD Father   . Heart disease Father  40 died  . Thyroid disease Mother   . Hypertension Mother   . Hypertension Brother   . Stroke Paternal Uncle     Review of Systems  Constitutional: Negative for chills, fever and weight loss.  HENT: Negative for congestion and hearing loss.   Eyes: Negative for blurred vision and pain.  Respiratory: Negative for cough and shortness of breath.   Cardiovascular: Negative for chest pain and leg swelling.  Gastrointestinal: Negative for abdominal pain, constipation, diarrhea and heartburn.  Genitourinary: Negative for dysuria and frequency.  Musculoskeletal: Negative for falls, joint pain and myalgias.  Neurological: Negative for dizziness, seizures and headaches.  Psychiatric/Behavioral: Negative for depression. The patient is not nervous/anxious and does not have insomnia.     BP (!) 168/94 (BP Location: Right  Arm, Patient Position: Sitting, Cuff Size: Normal)   Pulse 88   Temp 97.8 F (36.6 C) (Temporal)   Resp 16   Ht 5\' 2"  (1.575 m)   Wt 174 lb (78.9 kg)   SpO2 97%   BMI 31.83 kg/m   Physical Exam  Constitutional: She is oriented to person, place, and time. She appears well-developed and well-nourished.  HENT:  Head: Normocephalic and atraumatic.  Mouth/Throat: Oropharynx is clear and moist.  Eyes: Conjunctivae are normal. Pupils are equal, round, and reactive to light.  Fundi benign  Neck: Normal range of motion. Neck supple. No thyromegaly present.  Cardiovascular: Normal rate, regular rhythm and normal heart sounds.  Pulmonary/Chest: Effort normal and breath sounds normal. No respiratory distress.  Abdominal: Soft. Bowel sounds are normal.  Musculoskeletal: Normal range of motion. She exhibits no edema.  Lymphadenopathy:    She has no cervical adenopathy.  Neurological: She is alert and oriented to person, place, and time.  Gait normal  Skin: Skin is warm and dry.  Psychiatric: She has a normal mood and affect. Her behavior is normal. Thought content normal.  Nursing note and vitals reviewed. EKG is normal Chest x-ray is ordered Lab work is ordered We discussed the diagnosis of hypertension.  Diagram of the heart is shown to the patient and untreated hypertension diseases are discussed.  She agrees to go on medication.  She understands the first medicine will be a diuretic.  We will change his medicine or add additional medicines as indicated. ASSESSMENT/PLAN:   1. Family history of heart disease in female family member before age 73 Father had heart disease at a young age, died of an MI at 36  2. Essential hypertension New diagnosis.  Likely present since 2015 - CBC - COMPLETE METABOLIC PANEL WITH GFR - Lipid panel - VITAMIN D 25 Hydroxy (Vit-D Deficiency, Fractures) - Urinalysis, Routine w reflex microscopic - TSH - DG Chest 2 View; Future - EKG 12-Lead   Patient  Instructions  Take the chlorthalidone daily  Need lab tests Need chest x ray Check blood pressure daily Keep log of BP Bring me BP log in 2 weeks I will send you a letter with your test results.  If there is anything of concern, we will call right away.  See me yearly Call sooner for problems    Raylene Everts, MD

## 2017-05-28 NOTE — Patient Instructions (Signed)
Take the chlorthalidone daily  Need lab tests Need chest x ray Check blood pressure daily Keep log of BP Bring me BP log in 2 weeks I will send you a letter with your test results.  If there is anything of concern, we will call right away.  See me yearly Call sooner for problems

## 2017-05-29 LAB — CBC
HEMATOCRIT: 40.2 % (ref 35.0–45.0)
HEMOGLOBIN: 13.9 g/dL (ref 11.7–15.5)
MCH: 29.5 pg (ref 27.0–33.0)
MCHC: 34.6 g/dL (ref 32.0–36.0)
MCV: 85.4 fL (ref 80.0–100.0)
MPV: 11.5 fL (ref 7.5–12.5)
Platelets: 247 10*3/uL (ref 140–400)
RBC: 4.71 10*6/uL (ref 3.80–5.10)
RDW: 11.8 % (ref 11.0–15.0)
WBC: 9.8 10*3/uL (ref 3.8–10.8)

## 2017-05-29 LAB — COMPLETE METABOLIC PANEL WITH GFR
AG Ratio: 1.4 (calc) (ref 1.0–2.5)
ALBUMIN MSPROF: 4.3 g/dL (ref 3.6–5.1)
ALKALINE PHOSPHATASE (APISO): 53 U/L (ref 33–115)
ALT: 7 U/L (ref 6–29)
AST: 14 U/L (ref 10–35)
BUN: 11 mg/dL (ref 7–25)
CO2: 29 mmol/L (ref 20–32)
CREATININE: 0.87 mg/dL (ref 0.50–1.10)
Calcium: 9.6 mg/dL (ref 8.6–10.2)
Chloride: 104 mmol/L (ref 98–110)
GFR, EST NON AFRICAN AMERICAN: 80 mL/min/{1.73_m2} (ref >=60)
GFR, Est African American: 93 mL/min/{1.73_m2} (ref >=60)
GLUCOSE: 91 mg/dL (ref 65–99)
Globulin: 3 g/dL (calc) (ref 1.9–3.7)
Potassium: 4.2 mmol/L (ref 3.5–5.3)
Sodium: 142 mmol/L (ref 135–146)
Total Bilirubin: 0.6 mg/dL (ref 0.2–1.2)
Total Protein: 7.3 g/dL (ref 6.1–8.1)

## 2017-05-29 LAB — URINALYSIS, ROUTINE W REFLEX MICROSCOPIC
BILIRUBIN URINE: NEGATIVE
Glucose, UA: NEGATIVE
Hgb urine dipstick: NEGATIVE
Leukocytes, UA: NEGATIVE
Nitrite: NEGATIVE
PH: 5.5 (ref 5.0–8.0)
Protein, ur: NEGATIVE
SPECIFIC GRAVITY, URINE: 1.01 (ref 1.001–1.03)

## 2017-05-29 LAB — VITAMIN D 25 HYDROXY (VIT D DEFICIENCY, FRACTURES): Vit D, 25-Hydroxy: 30 ng/mL (ref 30–100)

## 2017-05-29 LAB — LIPID PANEL
CHOL/HDL RATIO: 2.8 (calc) (ref <5.0)
CHOLESTEROL: 183 mg/dL (ref <200)
HDL: 65 mg/dL (ref >50)
LDL CHOLESTEROL (CALC): 101 mg/dL — AB
NON-HDL CHOLESTEROL (CALC): 118 mg/dL (ref <130)
Triglycerides: 83 mg/dL (ref <150)

## 2017-05-29 LAB — TSH: TSH: 1.09 mIU/L

## 2017-05-31 MED FILL — CHLORTHALIDONE 25 MG TABS: 25 | 30 days supply | Qty: 30 | Fill #0

## 2017-06-09 ENCOUNTER — Encounter: Payer: Self-pay | Admitting: Family Medicine

## 2017-06-16 ENCOUNTER — Encounter: Payer: Self-pay | Admitting: Family Medicine

## 2017-06-17 ENCOUNTER — Telehealth: Payer: Self-pay

## 2017-06-17 MED ORDER — LISINOPRIL 5 MG PO TABS: 5.0000 mg | ORAL_TABLET | Freq: Every day | ORAL | 1 refills | Status: DC

## 2017-06-17 MED FILL — LISINOPRIL 5 MG TABLET: 5 | 30 days supply | Qty: 30 | Fill #0

## 2017-06-17 NOTE — Telephone Encounter (Signed)
New rx for lisinopril sent to pharmacy, Sara Freeman aware.

## 2017-06-23 ENCOUNTER — Encounter: Payer: Self-pay | Admitting: Family Medicine

## 2017-07-01 MED FILL — CHLORTHALIDONE 25 MG TABS: 25 | 60 days supply | Qty: 60 | Fill #1

## 2017-07-15 MED FILL — LISINOPRIL 5 MG TABLET: 5 | 60 days supply | Qty: 60 | Fill #1

## 2017-08-20 ENCOUNTER — Ambulatory Visit
Admission: RE | Admit: 2017-08-20 | Discharge: 2017-08-20 | Disposition: A | Discharge status: Home / Self Care | Payer: No Typology Code available for payment source | Source: Ambulatory Visit | Attending: Obstetrics & Gynecology | Admitting: Obstetrics & Gynecology

## 2017-08-20 ENCOUNTER — Other Ambulatory Visit: Payer: Self-pay | Admitting: Obstetrics & Gynecology

## 2017-08-20 DIAGNOSIS — R921 Mammographic calcification found on diagnostic imaging of breast: Secondary | ICD-10-CM

## 2017-08-27 ENCOUNTER — Ambulatory Visit
Admission: RE | Admit: 2017-08-27 | Discharge: 2017-08-27 | Disposition: A | Discharge status: Home / Self Care | Payer: No Typology Code available for payment source | Source: Ambulatory Visit | Attending: Obstetrics & Gynecology | Admitting: Obstetrics & Gynecology

## 2017-08-27 DIAGNOSIS — R921 Mammographic calcification found on diagnostic imaging of breast: Secondary | ICD-10-CM

## 2017-08-27 HISTORY — PX: BREAST BIOPSY: SHX20

## 2017-08-30 MED FILL — CHLORTHALIDONE 25 MG TAB: 25 | 90 days supply | Qty: 90 | Fill #2

## 2017-08-31 ENCOUNTER — Encounter: Payer: Self-pay | Admitting: Family Medicine

## 2017-09-02 ENCOUNTER — Ambulatory Visit: Payer: Self-pay | Admitting: Surgery

## 2017-09-02 DIAGNOSIS — D0512 Intraductal carcinoma in situ of left breast: Secondary | ICD-10-CM

## 2017-09-02 NOTE — H&P (View-Only) (Signed)
Sara Freeman Documented: 09/02/2017 9:28 AM Location: [facility] Patient #: [id number] DOB: 08/02/1970 Married / Language: Sara Freeman / Race: White Female  History of Present Illness [name] A. Zacharias Ridling MD; 09/02/2017 10:47 AM) Patient words: Patient sent at the request of Dr. Miquel Dunn for abnormal screening mammogram. Patient went for 6 month follow-up mammogram after a screening mammogram 6 months ago showed multiple foci of left breast microcalcifications central and lower quadrant. 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 Total area calcifications appears to be 5.7 cm. She denies any history of rest pain nipple discharge or breast mass. She did have a complete aunt with breast cancer in her 4s and her mom's side. She had a previous biopsy past back in 2017 is benign.                            CLINICAL DATA: 47 year old female presenting for first six-month follow-up of left breast calcifications. EXAM: 2D DIGITAL DIAGNOSTIC UNILATERAL LEFT MAMMOGRAM WITH CAD AND ADJUNCT TOMO COMPARISON: Previous exam(s). ACR Breast Density Category c: The breast tissue is heterogeneously dense, which may obscure small masses. FINDINGS: The additional calcifications are now demonstrated in the central lateral left breast spanning from posterior to middle depth, the previously described group appears similar to prior examination. However, the new additional groups demonstrate an amorphous morphology and the calcifications are in a linear distribution. Mammographic images were processed with CAD. IMPRESSION: Interval increase in indeterminate left breast calcifications. Recommendation is to attempt stereotactic biopsy of the most anterior and posterior groups. Please note, the posterior groups may be difficult to sample at the time of biopsy, the patient was made aware that this may be technically difficult but should be attempted. RECOMMENDATION:  Two area stereotactic biopsy of indeterminate left breast calcifications. I have discussed the findings and recommendations with the patient. Results were also provided in writing at the conclusion of the visit. If applicable, a reminder letter will be sent to the patient regarding the next appointment. BI-RADS CATEGORY 4: Suspicious. Electronically Signed By: Kristopher Oppenheim M.D. On: 08/20/2017 15:18                  Diagnosis 1. 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 Breast, left, needle core biopsy, lower outer quadrant anterior - DUCTAL CARCINOMA IN SITU WITH CALCIFICATIONS - SEE COMMENT Microscopic Comment 1. and 2. Immunohistochemistry was performed on the biopsy to assess for an invasive component (SMM, calponin, and p63). Myoepithelial cells are present supporting the diagnosis of ductal carcinoma in situ. The morphology is similar in the parts 1 and 2. The largest focus of ductal carcinoma in situ measure 0.6 cm. Given the similar morphology, prognostic markers (ER/PR) will be performed on block 2A and will be reported in an addendum. There is a fragment of detached epithelium in part 1B that suggest a papillary lesion. Dr. Lyndon Code has reviewed the case and agrees with the diagnosis. The results were called to [facility] on August 30, 2017. [name] MD Pathologist, Electronic Signature (Case signed 08/30/2017) Specimen Gross and Clinical Information Specimen Comment 1. Formalin time: 1:30 PM; calcifications 2. Formalin time: 1:50 PM Specimen(s) Obtained: 1. Breast, left, needle core biopsy, lower outer quadrant posterior 2. Breast, left, needle core biopsy, lower outer quadrant anterior 1 of 2 FINAL for  Sara Freeman, Sara Freeman [phone]) Specimen Clinical Information 1. R/O FCC vs  DCIS Gross 1. Received in formalin (time in formalin 1:30 p.m. on 08/27/17, cold ischemic time unknown), are multiple fragments and cores of soft tan yellow tissue which measure 2.0 x 2.0 x 0.5 cm in aggregate. The specimen is entirely submitted in two cassettes with tissue containing calcifications submitted in cassette A. 2. Received in formalin (time in formalin 1:50 p.m. on 08/27/17, cold ischemic time unknown), are fragments and cores of soft tan yellow tissue which measure 1.7 x 1.5 x 0.3 cm in aggregate. The specimen is submitted in toto. (GP:gt, 08/30/17) Stain(s) used in Diagnosis: The following stain(s) were used in diagnosing the case: P63, Smooth Muscle Myosin - 1 Heavy Chain, Calponin. The control(s) stained appropriately. Disclaimer Some of these immunohistochemical stains may have been developed and the performance characteristics determined by [facility]. Some may not have been cleared or approved by the U.S. Food and Drug Administration. The FDA has determined that such clearance or approval is not necessary. This test is used for clinical purposes. It should not be regarded as investigational or for research. This laboratory is certified under the [place] (CLIA-88) as qualified to perform high complexity clinical laboratory testing. Report signed out from the following location(s) Technical Component was performed at [facility]. [address],[place] [address].CVEL:[id number]., Interpretation was performed at [address], [place], [place] [address]. CLIA #: [id number], 2 of.  The patient is a 47 year old female.   Past Surgical History Sharyn Lull R. Brooks, CMA; 09/02/2017 10:15 AM) No pertinent past surgical history  Diagnostic Studies History Sharyn Lull R. [name], CMA; 09/02/2017 10:15 AM) Colonoscopy never Mammogram within last year Pap Smear  1-5 years ago  Allergies Alean Rinne, RMA; 09/02/2017 9:29 AM) No Known Drug Allergies [09/02/2017]: Allergies Reconciled  Medication History Alean Rinne, [place]; 09/02/2017 9:29 AM) Chlorthalidone (25MG  Tablet, Oral) Active. Lisinopril (5MG  Tablet, Oral) Active. Medications Reconciled  Social History Sharyn Lull R. Brooks, CMA; 09/02/2017 10:15 AM) Caffeine use Carbonated beverages, Coffee, Tea. No alcohol use No drug use Tobacco use Former smoker.  Family History Sharyn Lull R. Brooks, CMA; 09/02/2017 10:15 AM) Heart Disease Father. Hypertension Brother. Migraine Headache Mother. Respiratory Condition Father. Thyroid problems Mother.  Pregnancy / Birth History Sharyn Lull R. Brooks, CMA; 09/02/2017 10:15 AM) Age at menarche 78 years. Gravida 1 Maternal age 75-20 Para 1  Other Problems Sharyn Lull R. Brooks, CMA; 09/02/2017 10:15 AM) High blood pressure     Review of [facility] R. Brooks CMA; 09/02/2017 10:15 AM) General Not Present- Appetite Loss, Chills, Fatigue, Fever, Night Sweats, Weight Gain and Weight Loss. Skin Not Present- Change in Wart/Mole, Dryness, Hives, Jaundice, New Lesions, Non-Healing Wounds, Rash and Ulcer. HEENT Present- Wears glasses/contact lenses. Not Present- Earache, Hearing Loss, Hoarseness, Nose Bleed, Oral Ulcers, Ringing in the Ears, Seasonal Allergies, Sinus Pain, Sore Throat, Visual Disturbances and Yellow Eyes. Respiratory Not Present- Bloody sputum, Chronic Cough, Difficulty Breathing, Snoring and Wheezing. Breast Not Present- Breast Mass, Breast Pain, Nipple Discharge and Skin Changes. Cardiovascular Not Present- Chest Pain, Difficulty Breathing Lying Down, Leg Cramps, Palpitations, Rapid Heart Rate, Shortness of Breath and Swelling of Extremities. Musculoskeletal Not Present- Back Pain, Joint Pain, Joint Stiffness, Muscle Pain, Muscle Weakness and Swelling of Extremities. Neurological Not Present- Decreased Memory,  Fainting, Headaches, Numbness, Seizures, Tingling, Tremor, Trouble walking and Weakness.  Vitals Alean Rinne RMA; 09/02/2017 9:29 AM) 09/02/2017 9:28 AM Weight: 170.6 lb Height:  63in Body Surface Area: 1.81 m Body Mass Index: 30.22 kg/m  Temp.: 98.37F  Pulse: 108 (Regular)  BP: 135/78 (Sitting, Left Arm, Standard)      Physical Exam (Providencia Hottenstein A. Viggo Perko MD; 09/02/2017 10:47 AM)  General Mental Status-Alert. General Appearance-Consistent with stated age. Hydration-Well hydrated. Voice-Normal.  Head and Neck Head-normocephalic, atraumatic with no lesions or palpable masses. Trachea-midline. Thyroid Gland Characteristics - normal size and consistency.  Chest and Lung Exam Chest and lung exam reveals -quiet, even and easy respiratory effort with no use of accessory muscles and on auscultation, normal breath sounds, no adventitious sounds and normal vocal resonance. Inspection Chest Wall - Normal. Back - normal.  Breast Breast - Left-Symmetric, Non Tender, No Biopsy scars, no Dimpling, No Inflammation, No Lumpectomy scars, No Mastectomy scars, No Peau d' Orange. Breast - Right-Symmetric, Non Tender, No Biopsy scars, no Dimpling, No Inflammation, No Lumpectomy scars, No Mastectomy scars, No Peau d' Orange. Breast Lump-No Palpable Breast Mass.  Cardiovascular Cardiovascular examination reveals -normal heart sounds, regular rate and rhythm with no murmurs and normal pedal pulses bilaterally.  Neurologic Neurologic evaluation reveals -alert and oriented x 3 with no impairment of recent or remote memory. Mental Status-Normal.  Musculoskeletal Normal Exam - Left-Upper Extremity Strength Normal and Lower Extremity Strength Normal. Normal Exam - Right-Upper Extremity Strength Normal and Lower Extremity Strength Normal.  Lymphatic Head & Neck  General Head & Neck Lymphatics: Bilateral - Description - Normal. Axillary  General Axillary  Region: Bilateral - Description - Normal. Tenderness - Non Tender.    Assessment & Plan (Syona Wroblewski A. Makaelah Cranfield MD; 09/02/2017 10:48 AM)  BREAST NEOPLASM, TIS (DCIS), LEFT (D05.12) Impression: Discussed options of breast conservation versus mastectomy reconstruction. Patient's opted for left breast lumpectomy 2. She will require 2 seeds. Risks, benefits and long-term expectations discussed. Cosmetic outcome and change in breast size discussed. The need for other treatments and/or radiation therapy discussed. The patient will be referred to genetics. Risk of lumpectomy include bleeding, infection, seroma, more surgery, use of seed/wire, wound care, cosmetic deformity and the need for other treatments, death , blood clots, death. Pt agrees to proceed.  Current Plans You are being scheduled for surgery- Our schedulers will call you.  You should hear from our office's scheduling department within 5 working days about the location, date, and time of surgery. We try to make accommodations for patient's preferences in scheduling surgery, but sometimes the OR schedule or the surgeon's schedule prevents [place] from making those accommodations.  If you have not heard from our office [phone]) in 5 working days, call the office and ask for your surgeon's nurse.  If you have other questions about your diagnosis, plan, or surgery, call the office and ask for your surgeon's nurse.  Pt Education - [facility] Breast Cancer Information Given - [facility] "Breast Journey" Package We discussed the staging and pathophysiology of breast cancer. We discussed all of the different options for treatment for breast cancer including surgery, chemotherapy, radiation therapy, Herceptin, and antiestrogen therapy. We discussed a sentinel lymph node biopsy as she does not appear to having lymph node involvement right now. We discussed the performance of that with injection of radioactive tracer and blue dye. We discussed that she would  have an incision underneath her axillary hairline. We discussed that there is a bout a 10-20% chance of having a positive node with a sentinel lymph node biopsy and we will await the permanent pathology to make any other first further decisions in terms of her treatment.  One of these options might be to return to the operating room to perform an axillary lymph node dissection. We discussed about a 1-2% risk lifetime of chronic shoulder pain as well as lymphedema associated with a sentinel lymph node biopsy. We discussed the options for treatment of the breast cancer which included lumpectomy versus a mastectomy. We discussed the performance of the lumpectomy with a wire placement. We discussed a 10-20% chance of a positive margin requiring reexcision in the operating room. We also discussed that she may need radiation therapy or antiestrogen therapy or both if she undergoes lumpectomy. We discussed the mastectomy and the postoperative care for that as well. We discussed that there is no difference in her survival whether she undergoes lumpectomy with radiation therapy or antiestrogen therapy versus a mastectomy. There is a slight difference in the local recurrence rate being 3-5% with lumpectomy and about 1% with a mastectomy. We discussed the risks of operation including bleeding, infection, possible reoperation. She understands her further therapy will be based on what her stages at the time of her operation.  Pt Education - [facility] breast cancer surgery: discussed with patient and provided information. Referred to Oncology, for evaluation and follow up (Oncology). Routine. Referred to Radiation Oncology, for evaluation and follow up (Radiation Oncology). Routine.

## 2017-09-02 NOTE — H&P (Signed)
[name] Documented: 09/02/2017 9:28 AM Location: [facility] Patient #: [id number] DOB: August 22, 1970 Married / Language: Sara Freeman / Race: White Female  History of Present Illness Sara Moores A. Kashtyn Jankowski MD; 09/02/2017 10:47 AM) Patient words: Patient sent at the request of Dr. Miquel Freeman for abnormal screening mammogram. Patient went for 6 month follow-up mammogram after a screening mammogram 6 months ago showed multiple foci of left breast microcalcifications central and lower quadrant. 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 Total area calcifications appears to be 5.7 cm. She denies any history of rest pain nipple discharge or breast mass. She did have a complete aunt with breast cancer in her 62s and her mom's side. She had a previous biopsy past back in 2017 is benign.                            CLINICAL DATA: 47 year old female presenting for first six-month follow-up of left breast calcifications. EXAM: 2D DIGITAL DIAGNOSTIC UNILATERAL LEFT MAMMOGRAM WITH CAD AND ADJUNCT TOMO COMPARISON: Previous exam(s). ACR Breast Density Category c: The breast tissue is heterogeneously dense, which may obscure small masses. FINDINGS: The additional calcifications are now demonstrated in the central lateral left breast spanning from posterior to middle depth, the previously described group appears similar to prior examination. However, the new additional groups demonstrate an amorphous morphology and the calcifications are in a linear distribution. Mammographic images were processed with CAD. IMPRESSION: Interval increase in indeterminate left breast calcifications. Recommendation is to attempt stereotactic biopsy of the most anterior and posterior groups. Please note, the posterior groups may be difficult to sample at the time of biopsy, the patient was made aware that this may be technically difficult but should be attempted. RECOMMENDATION:  Two area stereotactic biopsy of indeterminate left breast calcifications. I have discussed the findings and recommendations with the patient. Results were also provided in writing at the conclusion of the visit. If applicable, a reminder letter will be sent to the patient regarding the next appointment. BI-RADS CATEGORY 4: Suspicious. Electronically Signed By: Sara Freeman M.D. On: 08/20/2017 15:18                  Diagnosis 1. 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 Breast, left, needle core biopsy, lower outer quadrant anterior - DUCTAL CARCINOMA IN SITU WITH CALCIFICATIONS - SEE COMMENT Microscopic Comment 1. and 2. Immunohistochemistry was performed on the biopsy to assess for an invasive component (SMM, calponin, and p63). Myoepithelial cells are present supporting the diagnosis of ductal carcinoma in situ. The morphology is similar in the parts 1 and 2. The largest focus of ductal carcinoma in situ measure 0.6 cm. Given the similar morphology, prognostic markers (ER/PR) will be performed on block 2A and will be reported in an addendum. There is a fragment of detached epithelium in part 1B that suggest a papillary lesion. Dr. Lyndon Freeman has reviewed the case and agrees with the diagnosis. The results were called to [facility] on August 30, 2017. Sara Sheller MD Pathologist, Electronic Signature (Case signed 08/30/2017) Specimen Gross and Clinical Information Specimen Comment 1. Formalin time: 1:30 PM; calcifications 2. Formalin time: 1:50 PM Specimen(s) Obtained: 1. Breast, left, needle core biopsy, lower outer quadrant posterior 2. Breast, left, needle core biopsy, lower outer quadrant anterior 1 of 2 FINAL for  Sara Freeman, Sara Freeman [phone]) Specimen Clinical Information 1. R/O FCC vs  DCIS Gross 1. Received in formalin (time in formalin 1:30 p.m. on 08/27/17, cold ischemic time unknown), are multiple fragments and cores of soft tan yellow tissue which measure 2.0 x 2.0 x 0.5 cm in aggregate. The specimen is entirely submitted in two cassettes with tissue containing calcifications submitted in cassette A. 2. Received in formalin (time in formalin 1:50 p.m. on 08/27/17, cold ischemic time unknown), are fragments and cores of soft tan yellow tissue which measure 1.7 x 1.5 x 0.3 cm in aggregate. The specimen is submitted in toto. (GP:gt, 08/30/17) Stain(s) used in Diagnosis: The following stain(s) were used in diagnosing the case: P63, Smooth Muscle Myosin - 1 Heavy Chain, Calponin. The control(s) stained appropriately. Disclaimer Some of these immunohistochemical stains may have been developed and the performance characteristics determined by [facility]. Some may not have been cleared or approved by the U.S. Food and Drug Administration. The FDA has determined that such clearance or approval is not necessary. This test is used for clinical purposes. It should not be regarded as investigational or for research. This laboratory is certified under the [place] (CLIA-88) as qualified to perform high complexity clinical laboratory testing. Report signed out from the following location(s) Technical Component was performed at [facility]. [address],[place] [address].[id number]., Interpretation was performed at [facility] [place], [place],  [address]. CLIA #: [id number], 2 of.  The patient is a 47 year old female.   Past Surgical History Sara Freeman; 09/02/2017 10:15 AM) No pertinent past surgical history  Diagnostic Studies History Sara Freeman; 09/02/2017 10:15 AM) Colonoscopy never Mammogram within last year Pap Smear  1-5 years ago  Allergies Sara Freeman; 09/02/2017 9:29 AM) No Known Drug Allergies [09/02/2017]: Allergies Reconciled  Medication History Sara Freeman; 09/02/2017 9:29 AM) Chlorthalidone (25MG  Tablet, Oral) Active. Lisinopril (5MG  Tablet, Oral) Active. Medications Reconciled  Social History Sara Freeman; 09/02/2017 10:15 AM) Caffeine use Carbonated beverages, Coffee, Tea. No alcohol use No drug use Tobacco use Former smoker.  Family History Sara Freeman; 09/02/2017 10:15 AM) Heart Disease Father. Hypertension Brother. Migraine Headache Mother. Respiratory Condition Father. Thyroid problems Mother.  Pregnancy / Birth History Sara Freeman; 09/02/2017 10:15 AM) Age at menarche 22 years. Gravida 1 Maternal age 44-20 Para 1  Other Problems Sara Freeman; 09/02/2017 10:15 AM) High blood pressure     Review of Systems [facility]. Freeman Sara Freeman; 09/02/2017 10:15 AM) General Not Present- Appetite Loss, Chills, Fatigue, Fever, Night Sweats, Weight Gain and Weight Loss. Skin Not Present- Change in Wart/Mole, Dryness, Hives, Jaundice, New Lesions, Non-Healing Wounds, Rash and Ulcer. HEENT Present- Wears glasses/contact lenses. Not Present- Earache, Hearing Loss, Hoarseness, Nose Bleed, Oral Ulcers, Ringing in the Ears, Seasonal Allergies, Sinus Pain, Sore Throat, Visual Disturbances and Yellow Eyes. Respiratory Not Present- Bloody sputum, Chronic Cough, Difficulty Breathing, Snoring and Wheezing. Breast Not Present- Breast Mass, Breast Pain, Nipple Discharge and Skin Changes. Cardiovascular Not Present- Chest Pain, Difficulty Breathing Lying Down, Leg Cramps, Palpitations, Rapid Heart Rate, Shortness of Breath and Swelling of Extremities. Musculoskeletal Not Present- Back Pain, Joint Pain, Joint Stiffness, Muscle Pain, Muscle Weakness and Swelling of Extremities. Neurological Not Present- Decreased Memory,  Fainting, Headaches, Numbness, Seizures, Tingling, Tremor, Trouble walking and Weakness.  Vitals Sara Freeman Sara Freeman; 09/02/2017 9:29 AM) 09/02/2017 9:28 AM Weight: 170.6 lb Height:  63in Body Surface Area: 1.81 m Body Mass Index: 30.22 kg/m  Temp.: 98.23F  Pulse: 108 (Regular)  BP: 135/78 (Sitting, Left Arm, Standard)      Physical Exam (Sara Freeman A. Aadhav Uhlig MD; 09/02/2017 10:47 AM)  General Mental Status-Alert. General Appearance-Consistent with stated age. Hydration-Well hydrated. Voice-Normal.  Head and Neck Head-normocephalic, atraumatic with no lesions or palpable masses. Trachea-midline. Thyroid Gland Characteristics - normal size and consistency.  Chest and Lung Exam Chest and lung exam reveals -quiet, even and easy respiratory effort with no use of accessory muscles and on auscultation, normal breath sounds, no adventitious sounds and normal vocal resonance. Inspection Chest Wall - Normal. Back - normal.  Breast Breast - Left-Symmetric, Non Tender, No Biopsy scars, no Dimpling, No Inflammation, No Lumpectomy scars, No Mastectomy scars, No Peau d' Orange. Breast - Right-Symmetric, Non Tender, No Biopsy scars, no Dimpling, No Inflammation, No Lumpectomy scars, No Mastectomy scars, No Peau d' Orange. Breast Lump-No Palpable Breast Mass.  Cardiovascular Cardiovascular examination reveals -normal heart sounds, regular rate and rhythm with no murmurs and normal pedal pulses bilaterally.  Neurologic Neurologic evaluation reveals -alert and oriented x 3 with no impairment of recent or remote memory. Mental Status-Normal.  Musculoskeletal Normal Exam - Left-Upper Extremity Strength Normal and Lower Extremity Strength Normal. Normal Exam - Right-Upper Extremity Strength Normal and Lower Extremity Strength Normal.  Lymphatic Head & Neck  General Head & Neck Lymphatics: Bilateral - Description - Normal. Axillary  General Axillary  Region: Bilateral - Description - Normal. Tenderness - Non Tender.    Assessment & Plan (Parris Signer A. Kynadi Dragos MD; 09/02/2017 10:48 AM)  BREAST NEOPLASM, TIS (DCIS), LEFT (D05.12) Impression: Discussed options of breast conservation versus mastectomy reconstruction. Patient's opted for left breast lumpectomy 2. She will require 2 seeds. Risks, benefits and long-term expectations discussed. Cosmetic outcome and change in breast size discussed. The need for other treatments and/or radiation therapy discussed. The patient will be referred to genetics. Risk of lumpectomy include bleeding, infection, seroma, more surgery, use of seed/wire, wound care, cosmetic deformity and the need for other treatments, death , blood clots, death. Pt agrees to proceed.  Current Plans You are being scheduled for surgery- Our schedulers will call you.  You should hear from our office's scheduling department within 5 working days about the location, date, and time of surgery. We try to make accommodations for patient's preferences in scheduling surgery, but sometimes the OR schedule or the surgeon's schedule prevents [place] from making those accommodations.  If you have not heard from our office [phone]) in 5 working days, call the office and ask for your surgeon's nurse.  If you have other questions about your diagnosis, plan, or surgery, call the office and ask for your surgeon's nurse.  Pt Education - [facility] Breast Cancer Information Given - [facility] "Breast Journey" Package We discussed the staging and pathophysiology of breast cancer. We discussed all of the different options for treatment for breast cancer including surgery, chemotherapy, radiation therapy, Herceptin, and antiestrogen therapy. We discussed a sentinel lymph node biopsy as she does not appear to having lymph node involvement right now. We discussed the performance of that with injection of radioactive tracer and blue dye. We discussed that she would  have an incision underneath her axillary hairline. We discussed that there is a bout a 10-20% chance of having a positive node with a sentinel lymph node biopsy and we will await the permanent pathology to make any other first further decisions in terms of her treatment.  One of these options might be to return to the operating room to perform an axillary lymph node dissection. We discussed about a 1-2% risk lifetime of chronic shoulder pain as well as lymphedema associated with a sentinel lymph node biopsy. We discussed the options for treatment of the breast cancer which included lumpectomy versus a mastectomy. We discussed the performance of the lumpectomy with a wire placement. We discussed a 10-20% chance of a positive margin requiring reexcision in the operating room. We also discussed that she may need radiation therapy or antiestrogen therapy or both if she undergoes lumpectomy. We discussed the mastectomy and the postoperative care for that as well. We discussed that there is no difference in her survival whether she undergoes lumpectomy with radiation therapy or antiestrogen therapy versus a mastectomy. There is a slight difference in the local recurrence rate being 3-5% with lumpectomy and about 1% with a mastectomy. We discussed the risks of operation including bleeding, infection, possible reoperation. She understands her further therapy will be based on what her stages at the time of her operation.  Pt Education - [facility] breast cancer surgery: discussed with patient and provided information. Referred to Oncology, for evaluation and follow up (Oncology). Routine. Referred to Radiation Oncology, for evaluation and follow up (Radiation Oncology). Routine.

## 2017-09-07 ENCOUNTER — Encounter: Payer: Self-pay | Admitting: Radiation Oncology

## 2017-09-08 ENCOUNTER — Telehealth: Payer: Self-pay | Admitting: Genetics

## 2017-09-08 NOTE — Telephone Encounter (Signed)
Left message for patient regarding Genetics appointment CPT Code [phone] and she can call her insurance and verify coverage for this code.

## 2017-09-09 ENCOUNTER — Encounter: Payer: Self-pay | Admitting: Family Medicine

## 2017-09-09 ENCOUNTER — Encounter: Payer: Self-pay | Admitting: Genetics

## 2017-09-09 ENCOUNTER — Other Ambulatory Visit: Payer: No Typology Code available for payment source

## 2017-09-09 ENCOUNTER — Inpatient Hospital Stay: Payer: No Typology Code available for payment source | Attending: Genetic Counselor | Admitting: Genetics

## 2017-09-09 DIAGNOSIS — Z170 Estrogen receptor positive status [ER+]: Secondary | ICD-10-CM | POA: Diagnosis not present

## 2017-09-09 DIAGNOSIS — Z315 Encounter for genetic counseling: Secondary | ICD-10-CM

## 2017-09-09 DIAGNOSIS — D0512 Intraductal carcinoma in situ of left breast: Secondary | ICD-10-CM

## 2017-09-09 DIAGNOSIS — C50919 Malignant neoplasm of unspecified site of unspecified female breast: Secondary | ICD-10-CM

## 2017-09-09 DIAGNOSIS — C50912 Malignant neoplasm of unspecified site of left female breast: Secondary | ICD-10-CM

## 2017-09-09 DIAGNOSIS — Z803 Family history of malignant neoplasm of breast: Secondary | ICD-10-CM | POA: Diagnosis not present

## 2017-09-09 HISTORY — DX: Malignant neoplasm of unspecified site of unspecified female breast: C50.919

## 2017-09-09 NOTE — Progress Notes (Signed)
REFERRING PROVIDER: Erroll Luna, MD [address] [place], [place] [address]  PRIMARY PROVIDER:  Raylene Everts, MD  PRIMARY REASON FOR VISIT:  1. Family history of breast cancer   2. Malignant neoplasm of left breast in female, estrogen receptor positive, unspecified site of breast ([place])      HISTORY OF PRESENT ILLNESS:   Sara Freeman, a 47 y.o. female, was seen for a [facility] cancer genetics consultation at the request of Dr. Brantley Stage due to a personal and family history of breast cancer.  Sara Freeman presents to clinic today to discuss the possibility of a hereditary predisposition to cancer, genetic testing, and to further clarify her future cancer risks, as well as potential cancer risks for family members.   On 08/27/2017, at the age of 30, Ms. Top was diagnosed with DUCTAL CARCINOMA IN SITU WITH CALCIFICATIONS of the left breast. She is planning to have breast conservation surgery followed by adjuvant radiation.  HORMONAL RISK FACTORS:  Menarche was at age 2/13.  First live birth at age 2.  OCP use for approximately 6 years.  Ovaries intact: yes.  Hysterectomy: no.  Menopausal status: premenopausal.  HRT use: 0 years. Colonoscopy: no; not examined. Mammogram within the last year: yes.   Past Medical History:  Diagnosis Date  . Allergy   . Breast cancer ([place])   . Breast cancer ([place]) 09/09/2017  . Endometriosis   . Family history of breast cancer   . Hypertension     Past Surgical History:  Procedure Laterality Date  . BREAST BIOPSY Left 2017   benign  . TUBAL LIGATION  1999   [facility]    Social History   Socioeconomic History  . Marital status: Married    Spouse name: [name]  . Number of children: 1  . Years of education: 44  . Highest education level: Associate degree: academic program  Social Needs  . Financial resource strain: Not hard at all  . Food insecurity - worry: Never true  . Food insecurity - inability: Never true  .  Transportation needs - medical: No  . Transportation needs - non-medical: No  Occupational History  . 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 Smoking status: Former Smoker    Packs/day: 1.00    Types: Cigarettes    Start date: 07/20/1986    Last attempt to quit: 07/20/2006    Years since quitting: 11.1  . Smokeless tobacco: Never Used  Substance and Sexual Activity  . Alcohol use: No  . Drug use: No  . Sexual activity: Yes    Birth control/protection: Surgical    Comment: tubal  Other Topics Concern  . None  Social History Narrative   Lives with husband Kandra Nicolas is at home but spends  Time with his GF   Has 2 dachshund   Likes to read   Goes camping once a month     FAMILY HISTORY:  We obtained a detailed, 4-generation family history.  Significant diagnoses are listed below: Family History  Problem Relation Age of Onset  . CAD Other   . Heart disease Other   . Stroke Paternal Grandfather   . Stroke Paternal Grandmother   . Aneurysm Maternal Grandfather   . Heart attack Father   . Hypertension Father   . COPD Father   . Heart disease Father        62 died  . Thyroid disease Mother   .  Hypertension Mother   . Hypertension Brother   . Stroke Paternal Uncle   . Breast cancer Other 60       metastatic   Sara Freeman has a 26 year-old son who has no history of cancer and no children.  Sara Freeman also has a brother who is 50 with no history of cancer.  Sara Freeman's father died at 62 due to a heart attack.  Sara Freeman has 8 paternal uncles and 4 paternal aunts with no history of cancer- several of these relatives have had strokes.  Sara Freeman is not aware of any diagnosis of cancer in her paternal cousins.  Sara Freeman's paternal grandparents died at ages >50 due to strokes.    Sara Freeman's mother is 70 with no history of cancer.  She has thyroid disease.  Sara Freeman has 2 maternal aunts and 2 maternal uncles with no history of cancer.  Sara Freeman is  not aware of any cancer diagnosis in her maternal cousins.  Sara Freeman's maternal grandfather died of an aortic annerysm and her maternal grandmother is 90 with no history of cancer.  This grandmother had a sister who died in her 60's due to metastatic breast cancer.    Sara Freeman is unaware of previous family history of genetic testing for hereditary cancer risks. Patient's maternal ancestors are of Northern European descent, and paternal ancestors are of Northern European descent. There is no reported Ashkenazi Jewish ancestry. There is no known consanguinity.  GENETIC COUNSELING ASSESSMENT: Sara Freeman is a 46 y.o. female with a personal and family history which is somewhat suggestive of a Hereditary Cancer Predisposition Syndrome. We, therefore, discussed and recommended the following at today's visit.   DISCUSSION: We reviewed the characteristics, features and inheritance patterns of hereditary cancer syndromes. We also discussed genetic testing, including the appropriate family members to test, the process of testing, insurance coverage and turn-around-time for results. We discussed the implications of a negative, positive and/or variant of uncertain significant result. We recommended Sara Freeman pursue genetic testing for the Common Hereditary Cancer gene panel.  Sara Freeman expressed she did not want to do a larger panel looking at that many cancer risk genes and therefore elected to have genetic testing for the Breast and Gynecological Cancer Panel which analyzes the following 23 genes: ATM, BARD1, BRCA1, BRCA2, BRIP1, CDH1, CHEK2, DICER1, EPCAM, MLH1, MSH2, MSH6, NBN, NF1, PALB2, PMS2, PTEN, RAD50, RAD51C, RAD51D, SMARCA4, STK11, TP53. Sara Freeman expressed that the results of her genetic testing would not impact her breast surgical decision.   We discussed that only 5-10% of cancers are associated with a Hereditary cancer predisposition syndrome.  One of the most common hereditary cancer syndromes  that increases breast cancer risk is called Hereditary Breast and Ovarian Cancer (HBOC) syndrome.  This syndrome is caused by mutations in the BRCA1 and BRCA2 genes.  This syndrome increases an individual's lifetime risk to develop breast, ovarian, pancreatic, and other types of cancer.  There are also many other cancer predisposition syndromes caused by mutations in several other genes.  We discussed that if she is found to have a mutation in one of these genes, it may impact surgical decisions, and alter future medical management recommendations such as increased cancer screenings and consideration of risk reducing surgeries.  A positive result could also have implications for the patient's family members.  A Negative result would mean we were unable to identify a hereditary component to her cancer, but does not rule out the   possibility of a hereditary basis for her cancer.  There could be mutations that are undetectable by current technology, or in genes not yet tested or identified to increase cancer risk.    We discussed the potential to find a Variant of Uncertain Significance or VUS.  These are variants that have not yet been identified as pathogenic or benign, and it is unknown if this variant is associated with increased cancer risk or if this is a normal finding.  Most VUS's are reclassified to benign or likely benign.   It should not be used to make medical management decisions. With time, we suspect the lab will determine the significance of any VUS's identified if any.   Based on Ms. Rybacki's personal and family history of cancer, she meets medical criteria for genetic testing. Despite that she meets criteria, she may still have an out of pocket cost. We discussed that if her out of pocket cost for testing is over $100, the laboratory will call and confirm whether she wants to proceed with testing.  If the out of pocket cost of testing is less than $100 she will be billed by the genetic testing  laboratory.  She requested a benefit investigation and prior-authorization be completed prior to submitting a sample for genetic testing.   PLAN: After considering the risks, benefits, and limitations, Ms. Najera  provided informed consent to pursue genetic testing. Blood will be drawn on 09/15/2017, and her sample was sent to [facility] for analysis of the [place]. Results should be available within approximately 2-3 weeks' time, at which point they will be disclosed by telephone to Ms. Herzberg, as will any additional recommendations warranted by these results. Ms. Powell will receive a summary of her genetic counseling visit and a copy of her results once available. This information will also be available in Epic. We encouraged Ms. Bulls to remain in contact with cancer genetics annually so that we can continuously update the family history and inform her of any changes in cancer genetics and testing that may be of benefit for her family. Ms. Lanum questions were answered to her satisfaction today. Our contact information was provided should additional questions or concerns arise.  Lastly, we encouraged Ms. Rayman to remain in contact with cancer genetics annually so that we can continuously update the family history and inform her of any changes in cancer genetics and testing that may be of benefit for this family.   Ms.  Dillenbeck questions were answered to her satisfaction today. Our contact information was provided should additional questions or concerns arise. Thank you for the referral and allowing [place] to share in the care of your patient.   Tana Felts, MS Genetic Counselor [email or web address] phone: [phone]  The patient was seen for a total of 35 minutes in face-to-face genetic counseling.  The patient was accompanied today by her husband.  This patient was discussed with Drs. Magrinat, Lindi Adie and/or Burr Medico who agrees with the above.

## 2017-09-10 ENCOUNTER — Encounter: Payer: Self-pay | Admitting: Genetics

## 2017-09-10 ENCOUNTER — Telehealth: Payer: Self-pay | Admitting: Genetics

## 2017-09-10 ENCOUNTER — Other Ambulatory Visit: Payer: Self-pay | Admitting: Surgery

## 2017-09-10 DIAGNOSIS — Z1379 Encounter for other screening for genetic and chromosomal anomalies: Secondary | ICD-10-CM | POA: Insufficient documentation

## 2017-09-10 DIAGNOSIS — D0512 Intraductal carcinoma in situ of left breast: Secondary | ICD-10-CM

## 2017-09-10 HISTORY — DX: Encounter for other screening for genetic and chromosomal anomalies: Z13.79

## 2017-09-10 NOTE — Telephone Encounter (Signed)
Sara Freeman called this morning and informed me that after thinking about genetic testing and discussing with her husband, she has decided not to have genetic counseling performed.  She had declined testing, I cancelled her lab blood draw appointment. She has my contact information and knows we are available to help coordinate testing at any time in the future is she is ever interested in the future.

## 2017-09-13 MED FILL — LISINOPRIL 5 MG TABLET: 5 | 90 days supply | Qty: 90 | Fill #2

## 2017-09-14 NOTE — Progress Notes (Signed)
Location of Breast Cancer: Left Breast  Histology per Pathology Report: 08/27/2017 Left Breast   Receptor Status: ER(+), PR (+), Her2-neu (), Ki-()   Did patient present with symptoms (if so, please note symptoms) or was this found on screening mammography?: Abnormal Mammogram  Past/Anticipated interventions by surgeon, if any: Scheduled Breast lumpectomy x2 with radioactive seed localization x 2 with Dr. Brantley Stage on 09/22/2017.  Anticipated surgery for Left breast lumpectomy x 2, 2 seeds per note from Erroll Luna MD 09/02/2017.  Referral to Medical Oncology  Past/Anticipated interventions by medical oncology, if any: Chemotherapy   Lymphedema issues, if any:     Pain issues, if any: none BP 117/73 (BP Location: Right Arm, Patient Position: Sitting)   Pulse 85   Temp 98.8 F (37.1 C) (Oral)   Ht '5\' 2"'$  (1.575 m)   Wt 170 lb 12.8 oz (77.5 kg)   SpO2 100%   BMI 31.24 kg/m   SAFETY ISSUES:  Prior radiation? No  Pacemaker/ICD? No  Possible current pregnancy? No, had tubes tied  Is the patient on methotrexate? No  Current Complaints / other details:  Benign brease biopsy 2017    Cori Razor, RN 09/14/2017,2:23 PM

## 2017-09-15 ENCOUNTER — Other Ambulatory Visit: Payer: No Typology Code available for payment source

## 2017-09-15 ENCOUNTER — Ambulatory Visit
Admission: RE | Admit: 2017-09-15 | Discharge: 2017-09-15 | Disposition: A | Discharge status: Home / Self Care | Payer: No Typology Code available for payment source | Source: Ambulatory Visit | Attending: Radiation Oncology | Admitting: Radiation Oncology

## 2017-09-15 ENCOUNTER — Other Ambulatory Visit: Payer: Self-pay

## 2017-09-15 ENCOUNTER — Encounter: Payer: Self-pay | Admitting: Radiation Oncology

## 2017-09-15 DIAGNOSIS — Z170 Estrogen receptor positive status [ER+]: Secondary | ICD-10-CM | POA: Diagnosis not present

## 2017-09-15 DIAGNOSIS — Z79899 Other long term (current) drug therapy: Secondary | ICD-10-CM | POA: Insufficient documentation

## 2017-09-15 DIAGNOSIS — Z803 Family history of malignant neoplasm of breast: Secondary | ICD-10-CM | POA: Insufficient documentation

## 2017-09-15 DIAGNOSIS — I10 Essential (primary) hypertension: Secondary | ICD-10-CM | POA: Diagnosis not present

## 2017-09-15 DIAGNOSIS — D0512 Intraductal carcinoma in situ of left breast: Secondary | ICD-10-CM | POA: Insufficient documentation

## 2017-09-15 DIAGNOSIS — N809 Endometriosis, unspecified: Secondary | ICD-10-CM | POA: Insufficient documentation

## 2017-09-15 DIAGNOSIS — Z87891 Personal history of nicotine dependence: Secondary | ICD-10-CM | POA: Insufficient documentation

## 2017-09-15 DIAGNOSIS — C50512 Malignant neoplasm of lower-outer quadrant of left female breast: Secondary | ICD-10-CM

## 2017-09-15 NOTE — Pre-Procedure Instructions (Addendum)
Sara Freeman  09/15/2017      [place], [place] - [address] [place] [address] Phone: [phone] Fax: [phone]    Your procedure is scheduled on September 22, 2017.  Report to [facility] Admitting at 830 AM  Call this number if you have problems the morning of surgery:  [phone]   Remember:  Do not eat food or drink liquids after midnight.  Take these medicines the morning of surgery with A SIP OF WATER acetaminophen (tylenol).  Please finish you Ensure pre-surgery drink before leaving your house the morning of surgery.  7 days prior to surgery STOP taking any Aspirin (unless otherwise instructed by your surgeon), Aleve, Naproxen, Ibuprofen, Motrin, Advil, Goody's, BC's, all herbal medications, fish oil, and all vitamins  Continue all other medications as instructed by your physician except follow the above medication instructions before surgery   Do not wear jewelry, make-up or nail polish.  Do not wear lotions, powders, or perfumes, or deodorant.  Do not shave 48 hours prior to surgery.    Do not bring valuables to the hospital.  [facility] is not responsible for any belongings or valuables.  Contacts, dentures or bridgework may not be worn into surgery.  Leave your suitcase in the car.  After surgery it may be brought to your room.  For patients admitted to the hospital, discharge time will be determined by your treatment team.  Patients discharged the day of surgery will not be allowed to drive home.   Special instructions:   [facility]- Preparing For Surgery  Before surgery, you can play an important role. Because skin is not sterile, your skin needs to be as free of germs as possible. You can reduce the number of germs on your skin by washing with CHG (chlorahexidine gluconate) Soap before surgery.  CHG is an antiseptic cleaner which kills germs and bonds with the skin to continue  killing germs even after washing.  Please do not use if you have an allergy to CHG or antibacterial soaps. If your skin becomes reddened/irritated stop using the CHG.  Do not shave (including legs and underarms) for at least 48 hours prior to first CHG shower. It is OK to shave your face.  Please follow these instructions carefully.   1. Shower the NIGHT BEFORE SURGERY and the MORNING OF SURGERY with CHG.   2. If you chose to wash your hair, wash your hair first as usual with your normal shampoo.  3. After you shampoo, rinse your hair and body thoroughly to remove the shampoo.  4. Use CHG as you would any other liquid soap. You can apply CHG directly to the skin and wash gently with a scrungie or a clean washcloth.   5. Apply the CHG Soap to your body ONLY FROM THE NECK DOWN.  Do not use on open wounds or open sores. Avoid contact with your eyes, ears, mouth and genitals (private parts). Wash Face and genitals (private parts)  with your normal soap.  6. Wash thoroughly, paying special attention to the area where your surgery will be performed.  7. Thoroughly rinse your body with warm water from the neck down.  8. DO NOT shower/wash with your normal soap after using and rinsing off the CHG Soap.  9. Pat yourself dry with a CLEAN TOWEL.  10. Wear CLEAN PAJAMAS to bed the night before surgery, wear comfortable clothes the morning  of surgery  11. Place CLEAN SHEETS on your bed the night of your first shower and DO NOT SLEEP WITH PETS.    Day of Surgery: Do not apply any deodorants/lotions. Please wear clean clothes to the hospital/surgery center.     Please read over the following fact sheets that you were given.

## 2017-09-15 NOTE — Progress Notes (Addendum)
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   HISTORY OF PRESENT ILLNESS: Sara Freeman is a 47 y.o. female seen at the request of Dr. Brantley Stage for a new diagnosis of left breast cancer. The patient presented for screening mammogram which revealed an area of calcifications about 6 months ago and proceeded with repeat diagnostic mammogram which revealed calcifications measuring up to 5.7 cm. She underwent stereo guided biopsy of the lower outer quadrant at an anterior site as well as posterior site. She was found on both biopsies to have DCIS. No grade was given, but her markers were ER/PR positive. She is scheduled to undergo bracketed lumpectomy on 09/22/17.     PREVIOUS RADIATION THERAPY: No   PAST MEDICAL HISTORY:  Past Medical History:  Diagnosis Date  . Allergy   . Breast cancer ([place])   . Breast cancer ([place]) 09/09/2017  . Endometriosis   . Family history of breast cancer   . Hypertension        PAST SURGICAL HISTORY: Past Surgical History:  Procedure Laterality Date  . BREAST BIOPSY Left 2017   benign  . TUBAL LIGATION  1999   [facility]     FAMILY HISTORY:  Family History  Problem Relation Age of Onset  . CAD Other   . Heart disease Other   . Stroke Paternal Grandfather   . Stroke Paternal Grandmother   . Aneurysm Maternal Grandfather   . Heart attack Father   . Hypertension Father   . COPD Father   . Heart disease Father        1 died  . Thyroid disease Mother   . Hypertension Mother   . Hypertension Brother   . Stroke Paternal Uncle   . Breast cancer Other 60       metastatic     SOCIAL HISTORY:  reports that she quit smoking about 11 years ago. Her  smoking use included cigarettes. She started smoking about 31 years ago. She smoked 1.00 pack per day. she has never used smokeless tobacco. She reports that she does not drink alcohol or use drugs. The patient is married and lives in [place]. She works in [place], and works in a Teacher, music.    ALLERGIES: Azithromycin and Penicillins   MEDICATIONS:  Current Outpatient Medications  Medication Sig Dispense Refill  . acetaminophen (TYLENOL) 500 MG tablet Take 500-1,000 mg by mouth every 6 (six) hours as needed (for pain.).    Marland Kitchen chlorthalidone (HYGROTON) 25 MG tablet Take 1 tablet (25 mg total) daily by mouth. 90 tablet 3  . CINNAMON PO Take 1,000 mg by mouth daily.    Marland Kitchen ibuprofen (ADVIL,MOTRIN) 200 MG tablet Take 400 mg by mouth every 8 (eight) hours as needed (for pain.).    Marland Kitchen lisinopril (PRINIVIL,ZESTRIL) 5 MG tablet Take 1 tablet (5 mg total) by mouth daily. 90 tablet 1  . naproxen sodium (ALEVE) 220 MG tablet Take 440 mg by mouth 2 (two) times daily as needed (for pain.).     No current facility-administered medications for this encounter.      REVIEW OF SYSTEMS: On review of systems, the patient reports  that she is doing well overall. She denies any chest pain, shortness of breath, cough, fevers, chills, night sweats, unintended weight changes. She denies any bowel or bladder disturbances, and denies abdominal pain, nausea or vomiting. She denies any new musculoskeletal or joint aches or pains. A complete review of systems is obtained and is otherwise negative.     PHYSICAL EXAM:  Wt Readings from Last 3 Encounters:  09/15/17 170 lb 12.8 oz (77.5 kg)  05/28/17 174 lb (78.9 kg)  02/05/17 167 lb (75.8 kg)   Temp Readings from Last 3 Encounters:  09/15/17 98.8 F (37.1 C) (Oral)  05/28/17 97.8 F (36.6 C) (Temporal)   BP Readings from Last 3 Encounters:  09/15/17 117/73  05/28/17 (!) 168/94  02/05/17 (!) 152/88   Pulse Readings from Last 3 Encounters:  09/15/17 85    05/28/17 88  02/05/17 84   Pain Assessment Pain Score: 0-No pain/10  In general this is a well appearing caucasian female in no acute distress. She is alert and oriented x4 and appropriate throughout the examination. HEENT reveals that the patient is normocephalic, atraumatic. EOMs are intact. PERRLA. Skin is intact without any evidence of gross lesions.  Cardiopulmonary assessment is negative for acute distress and she exhibits normal effort. Breast exam is deferred to her postop evaluation    ECOG = 0  0 - Asymptomatic (Fully active, able to carry on all predisease activities without restriction)  1 - Symptomatic but completely ambulatory (Restricted in physically strenuous activity but ambulatory and able to carry out work of a light or sedentary nature. For example, light housework, office work)  2 - Symptomatic, <50% in bed during the day (Ambulatory and capable of all self care but unable to carry out any work activities. Up and about more than 50% of waking hours)  3 - Symptomatic, >50% in bed, but not bedbound (Capable of only limited self-care, confined to bed or chair 50% or more of waking hours)  4 - Bedbound (Completely disabled. Cannot carry on any self-care. Totally confined to bed or chair)  5 - Death   Eustace Pen MM, Creech RH, Tormey DC, et al. [id number]). "Toxicity and response criteria of the [facility] Group". [place] Oncol. 5 (6): 649-55    LABORATORY DATA:  Lab Results  Component Value Date   WBC 9.8 05/28/2017   HGB 13.9 05/28/2017   HCT 40.2 05/28/2017   MCV 85.4 05/28/2017   PLT 247 05/28/2017   Lab Results  Component Value Date   NA 142 05/28/2017   K 4.2 05/28/2017   CL 104 05/28/2017   CO2 29 05/28/2017   Lab Results  Component Value Date   ALT 7 05/28/2017   AST 14 05/28/2017   BILITOT 0.6 05/28/2017      RADIOGRAPHY: Mm Diag Breast Tomo Uni Left  Result Date: 08/20/2017 CLINICAL DATA:  47 year old female presenting for  first six-month follow-up of left breast calcifications. EXAM: 2D DIGITAL DIAGNOSTIC UNILATERAL LEFT MAMMOGRAM WITH CAD AND ADJUNCT TOMO COMPARISON:  Previous exam(s). ACR Breast Density Category c: The breast tissue is heterogeneously dense, which may obscure small masses. FINDINGS: The additional calcifications are now demonstrated in the central lateral left breast spanning from posterior to middle depth, the previously described group appears similar to prior examination. However, the new additional groups demonstrate an amorphous morphology and the calcifications are in a linear distribution. Mammographic images were processed with CAD. IMPRESSION: Interval increase in indeterminate left breast calcifications. Recommendation is to  attempt stereotactic biopsy of the most anterior and posterior groups. Please note, the posterior groups may be difficult to sample at the time of biopsy, the patient was made aware that this may be technically difficult but should be attempted. RECOMMENDATION: Two area stereotactic biopsy of indeterminate left breast calcifications. I have discussed the findings and recommendations with the patient. Results were also provided in writing at the conclusion of the visit. If applicable, a reminder letter will be sent to the patient regarding the next appointment. BI-RADS CATEGORY  4: Suspicious. Electronically Signed   By: Kristopher Oppenheim M.D.   On: 08/20/2017 15:18   Mm Clip Placement Left  Result Date: 08/27/2017 CLINICAL DATA:  Status post stereotactic biopsies left breast calcifications. EXAM: DIAGNOSTIC LEFT MAMMOGRAM POST STEREOTACTIC BIOPSIES COMPARISON:  Previous exam(s). FINDINGS: Mammographic images were obtained following stereotactic guided biopsies of the left breast. Mammographic images show there is a coil shaped clip far posteriorly in the lower outer quadrant of the left breast in appropriate position. There is a ribbon shaped clip in the slightly lower outer quadrant  of the right breast anteriorly. The clips are located 5.7 cm apart. IMPRESSION: Status post stereotactic biopsies of the left breast with pathology pending. Final Assessment: Post Procedure Mammograms for Marker Placement Electronically Signed   By: Lillia Mountain M.D.   On: 08/27/2017 14:20   Mm Lt Breast Bx W Loc Dev 1st Lesion Image Bx Spec Stereo Guide  Addendum Date: 09/01/2017   ADDENDUM REPORT: 08/31/2017 15:01 ADDENDUM: Pathology revealed DUCTAL CARCINOMA IN SITU WITH CALCIFICATIONS, DETACHED EPITHELIAL FRAGMENTED WITH PAPILLARY FEATURES of the Left breast, lower outer quadrant posterior. DUCTAL CARCINOMA IN SITU WITH CALCIFICATIONS of the Left breast, lower outer quadrant anterior. This was found to be concordant by Dr. Lillia Mountain. Pathology results were discussed with the patient by telephone. The patient reported doing well after the biopsies with tenderness at the sites. Post biopsy instructions and care were reviewed and questions were answered. The patient was encouraged to call [facility] for any additional concerns. Surgical consultation has been arranged with Dr. Erroll Luna at [facility] on September 02, 2017. Pathology results reported by Terie Purser, RN on 08/31/2017. Electronically Signed   By: Lillia Mountain M.D.   On: 08/31/2017 15:01   Result Date: 09/01/2017 CLINICAL DATA:  Left breast calcifications. EXAM: LEFT BREAST STEREOTACTIC CORE NEEDLE BIOPSIES COMPARISON:  Previous exams. FINDINGS: The patient and I discussed the procedure of stereotactic-guided biopsy including benefits and alternatives. We discussed the high likelihood of a successful procedure. We discussed the risks of the procedure including infection, bleeding, tissue injury, clip migration, and inadequate sampling. Informed written consent was given. The usual time out protocol was performed immediately prior to the procedure. Using sterile technique and 1% lidocaine and 1%  lidocaine with epinephrine as local anesthetic, under stereotactic guidance, a 9 gauge vacuum assisted device was used to perform core needle biopsy of calcifications in the lower outer quadrant of the left breast (posterior) using a lateral to medial approach. Specimen radiograph was performed showing calcifications are present in the tissue samples. Specimens with calcifications are identified for pathology. Lesion quadrant: Lower outer quadrant (posterior). At the conclusion of the procedure, a coil shaped tissue marker clip was deployed into the biopsy cavity. Follow-up 2-view mammogram was performed and dictated separately. The patient and I discussed the procedure of stereotactic-guided biopsy including benefits and alternatives. We discussed the high likelihood of a successful procedure. We discussed the  risks of the procedure including infection, bleeding, tissue injury, clip migration, and inadequate sampling. Informed written consent was given. The usual time out protocol was performed immediately prior to the procedure. Using sterile technique and 1% lidocaine and 1% lidocaine with epinephrine as local anesthetic, under stereotactic guidance, a 9 gauge vacuum assisted device was used to perform core needle biopsy of calcifications in the lower outer quadrant of the left breast (anterior) using a lateral to medial approach. Specimen radiograph was performed showing calcifications are present in the tissue samples. Specimens with calcifications are identified for pathology. Lesion quadrant: Lower outer quadrant (anterior). At the conclusion of the procedure, a ribbon shaped tissue marker clip was deployed into the biopsy cavity. Follow-up 2-view mammogram was performed and dictated separately. IMPRESSION: Stereotactic-guided biopsies of the left breast. No apparent complications. Electronically Signed: By: Lillia Mountain M.D. On: 08/27/2017 14:16   Mm Lt Breast Bx W Loc Dev Ea Ad Lesion Img Bx Spec Stereo  Guide  Addendum Date: 09/01/2017   ADDENDUM REPORT: 08/31/2017 15:01 ADDENDUM: Pathology revealed DUCTAL CARCINOMA IN SITU WITH CALCIFICATIONS, DETACHED EPITHELIAL FRAGMENTED WITH PAPILLARY FEATURES of the Left breast, lower outer quadrant posterior. DUCTAL CARCINOMA IN SITU WITH CALCIFICATIONS of the Left breast, lower outer quadrant anterior. This was found to be concordant by Dr. Lillia Mountain. Pathology results were discussed with the patient by telephone. The patient reported doing well after the biopsies with tenderness at the sites. Post biopsy instructions and care were reviewed and questions were answered. The patient was encouraged to call [facility] for any additional concerns. Surgical consultation has been arranged with Dr. Erroll Luna at [facility] on September 02, 2017. Pathology results reported by Terie Purser, RN on 08/31/2017. Electronically Signed   By: Lillia Mountain M.D.   On: 08/31/2017 15:01   Result Date: 09/01/2017 CLINICAL DATA:  Left breast calcifications. EXAM: LEFT BREAST STEREOTACTIC CORE NEEDLE BIOPSIES COMPARISON:  Previous exams. FINDINGS: The patient and I discussed the procedure of stereotactic-guided biopsy including benefits and alternatives. We discussed the high likelihood of a successful procedure. We discussed the risks of the procedure including infection, bleeding, tissue injury, clip migration, and inadequate sampling. Informed written consent was given. The usual time out protocol was performed immediately prior to the procedure. Using sterile technique and 1% lidocaine and 1% lidocaine with epinephrine as local anesthetic, under stereotactic guidance, a 9 gauge vacuum assisted device was used to perform core needle biopsy of calcifications in the lower outer quadrant of the left breast (posterior) using a lateral to medial approach. Specimen radiograph was performed showing calcifications are present in the tissue samples.  Specimens with calcifications are identified for pathology. Lesion quadrant: Lower outer quadrant (posterior). At the conclusion of the procedure, a coil shaped tissue marker clip was deployed into the biopsy cavity. Follow-up 2-view mammogram was performed and dictated separately. The patient and I discussed the procedure of stereotactic-guided biopsy including benefits and alternatives. We discussed the high likelihood of a successful procedure. We discussed the risks of the procedure including infection, bleeding, tissue injury, clip migration, and inadequate sampling. Informed written consent was given. The usual time out protocol was performed immediately prior to the procedure. Using sterile technique and 1% lidocaine and 1% lidocaine with epinephrine as local anesthetic, under stereotactic guidance, a 9 gauge vacuum assisted device was used to perform core needle biopsy of calcifications in the lower outer quadrant of the left breast (anterior) using a lateral to medial approach. Specimen  radiograph was performed showing calcifications are present in the tissue samples. Specimens with calcifications are identified for pathology. Lesion quadrant: Lower outer quadrant (anterior). At the conclusion of the procedure, a ribbon shaped tissue marker clip was deployed into the biopsy cavity. Follow-up 2-view mammogram was performed and dictated separately. IMPRESSION: Stereotactic-guided biopsies of the left breast. No apparent complications. Electronically Signed: By: Lillia Mountain M.D. On: 08/27/2017 14:16       IMPRESSION/PLAN: 1. ER/PR positive DCIS of the left breast. Dr. Lisbeth Renshaw discusses the pathology findings and reviews the nature of non invasive breast disease. The consensus from the breast conference include breast conservation with lumpectomy which is planned for 09/22/17. Her course would then be followed by external radiotherapy to the breast followed by antiestrogen therapy. We discussed the risks,  benefits, short, and long term effects of radiotherapy, and the patient is interested in proceeding. Dr. Lisbeth Renshaw discusses the delivery and logistics of radiotherapy and anticipates a course of 6 1/2 weeks with deep inspiration breath hold technique. We will see her back about 2 weeks after surgery to move forward with the simulation and planning process and anticipate starting radiotherapy about 4 weeks after surgery.  2. Possible genetic predisposition to malignancy. The patient has been offered genetic counseling but has declined at this time.     The above documentation reflects my direct findings during this shared patient visit. Please see the separate note by Dr. Lisbeth Renshaw on this date for the remainder of the patient's plan of care.    Carola Rhine, PAC

## 2017-09-16 ENCOUNTER — Encounter (HOSPITAL_COMMUNITY)
Admission: RE | Admit: 2017-09-16 | Discharge: 2017-09-16 | Disposition: A | Discharge status: Home / Self Care | Payer: No Typology Code available for payment source | Source: Ambulatory Visit | Attending: Surgery | Admitting: Surgery

## 2017-09-16 ENCOUNTER — Other Ambulatory Visit: Payer: Self-pay

## 2017-09-16 ENCOUNTER — Encounter (HOSPITAL_COMMUNITY): Payer: Self-pay

## 2017-09-16 DIAGNOSIS — D0512 Intraductal carcinoma in situ of left breast: Secondary | ICD-10-CM | POA: Insufficient documentation

## 2017-09-16 DIAGNOSIS — Z01812 Encounter for preprocedural laboratory examination: Secondary | ICD-10-CM | POA: Insufficient documentation

## 2017-09-16 LAB — CBC
HEMATOCRIT: 39.6 % (ref 36.0–46.0)
HEMOGLOBIN: 13.5 g/dL (ref 12.0–15.0)
MCH: 29.4 pg (ref 26.0–34.0)
MCHC: 34.1 g/dL (ref 30.0–36.0)
MCV: 86.3 fL (ref 78.0–100.0)
Platelets: 264 10*3/uL (ref 150–400)
RBC: 4.59 MIL/uL (ref 3.87–5.11)
RDW: 13 % (ref 11.5–15.5)
WBC: 11.4 10*3/uL — ABNORMAL HIGH (ref 4.0–10.5)

## 2017-09-16 LAB — BASIC METABOLIC PANEL
ANION GAP: 11 (ref 5–15)
BUN: 15 mg/dL (ref 6–20)
CHLORIDE: 101 mmol/L (ref 101–111)
CO2: 28 mmol/L (ref 22–32)
Calcium: 9.4 mg/dL (ref 8.9–10.3)
Creatinine, Ser: 0.81 mg/dL (ref 0.44–1.00)
GFR calc Af Amer: >60 mL/min (ref >60)
Glucose, Bld: 102 mg/dL — ABNORMAL HIGH (ref 65–99)
POTASSIUM: 3.6 mmol/L (ref 3.5–5.1)
SODIUM: 140 mmol/L (ref 135–145)

## 2017-09-16 MED ORDER — CHLORHEXIDINE GLUCONATE CLOTH 2 % EX PADS: 6.0000 | MEDICATED_PAD | Freq: Once | CUTANEOUS | Status: DC

## 2017-09-16 NOTE — Progress Notes (Addendum)
PCP: Raylene Everts, MD  Cardiologist: pt denies  EKG: 06/07/17 in [facility]  Stress test: pt denies  ECHO: pt denies  Cardiac Cath: pt denies  Chest x-ray: pt denies past year, no recent respiratory complications/infections

## 2017-09-21 ENCOUNTER — Ambulatory Visit
Admission: RE | Admit: 2017-09-21 | Discharge: 2017-09-21 | Disposition: A | Discharge status: Home / Self Care | Payer: No Typology Code available for payment source | Source: Ambulatory Visit | Attending: Surgery | Admitting: Surgery

## 2017-09-21 DIAGNOSIS — D0512 Intraductal carcinoma in situ of left breast: Secondary | ICD-10-CM

## 2017-09-21 MED ORDER — CLINDAMYCIN PHOSPHATE 900 MG/50ML IV SOLN
900.0000 mg | INTRAVENOUS | Status: AC
  Administered 2017-09-22: 900 mg via INTRAVENOUS
  Filled 2017-09-21: qty 50

## 2017-09-21 MED ORDER — ACETAMINOPHEN 500 MG PO TABS
1000.0000 mg | ORAL_TABLET | ORAL | Status: AC
  Administered 2017-09-22: 1000 mg via ORAL
  Filled 2017-09-21: qty 2

## 2017-09-21 MED ORDER — GABAPENTIN 300 MG PO CAPS
300.0000 mg | ORAL_CAPSULE | ORAL | Status: AC
  Administered 2017-09-22: 300 mg via ORAL
  Filled 2017-09-21: qty 1

## 2017-09-21 NOTE — Anesthesia Preprocedure Evaluation (Addendum)
Anesthesia Evaluation  Patient identified by MRN, date of birth, ID band Patient awake    Reviewed: Allergy & Precautions, H&P , NPO status , Patient's Chart, lab work & pertinent test results  Airway Mallampati: II  TM Distance: >3 FB Neck ROM: Full    Dental no notable dental hx. (+) Teeth Intact, Dental Advisory Given   Pulmonary neg pulmonary ROS, former smoker,    Pulmonary exam normal breath sounds clear to auscultation       Cardiovascular Exercise Tolerance: Good hypertension, Pt. on medications  Rhythm:Regular Rate:Normal     Neuro/Psych negative neurological ROS  negative psych ROS   GI/Hepatic negative GI ROS, Neg liver ROS,   Endo/Other  negative endocrine ROS  Renal/GU negative Renal ROS  negative genitourinary   Musculoskeletal   Abdominal   Peds  Hematology negative hematology ROS (+)   Anesthesia Other Findings   Reproductive/Obstetrics negative OB ROS                            Anesthesia Physical Anesthesia Plan  ASA: II  Anesthesia Plan: General   Post-op Pain Management:    Induction: Intravenous  PONV Risk Score and Plan: 4 or greater and Ondansetron, Dexamethasone and Midazolam  Airway Management Planned: LMA  Additional Equipment:   Intra-op Plan:   Post-operative Plan: Extubation in OR  Informed Consent: I have reviewed the patients History and Physical, chart, labs and discussed the procedure including the risks, benefits and alternatives for the proposed anesthesia with the patient or authorized representative who has indicated his/her understanding and acceptance.   Dental advisory given  Plan Discussed with: CRNA  Anesthesia Plan Comments:         Anesthesia Quick Evaluation

## 2017-09-22 ENCOUNTER — Encounter (HOSPITAL_COMMUNITY): Payer: Self-pay | Admitting: Critical Care Medicine

## 2017-09-22 ENCOUNTER — Ambulatory Visit
Admission: RE | Admit: 2017-09-22 | Discharge: 2017-09-22 | Disposition: A | Discharge status: Home / Self Care | Payer: No Typology Code available for payment source | Source: Ambulatory Visit | Attending: Surgery | Admitting: Surgery

## 2017-09-22 ENCOUNTER — Ambulatory Visit (HOSPITAL_COMMUNITY): Payer: No Typology Code available for payment source | Admitting: Anesthesiology

## 2017-09-22 ENCOUNTER — Ambulatory Visit (HOSPITAL_COMMUNITY)
Admission: RE | Admit: 2017-09-22 | Discharge: 2017-09-22 | Disposition: A | Discharge status: Home / Self Care | Payer: No Typology Code available for payment source | Source: Ambulatory Visit | Attending: Surgery | Admitting: Surgery

## 2017-09-22 ENCOUNTER — Encounter (HOSPITAL_COMMUNITY)
Admission: RE | Disposition: A | Discharge status: Home / Self Care | Payer: Self-pay | Source: Ambulatory Visit | Attending: Surgery

## 2017-09-22 ENCOUNTER — Ambulatory Visit (HOSPITAL_COMMUNITY): Payer: No Typology Code available for payment source | Admitting: Emergency Medicine

## 2017-09-22 DIAGNOSIS — Z803 Family history of malignant neoplasm of breast: Secondary | ICD-10-CM | POA: Diagnosis not present

## 2017-09-22 DIAGNOSIS — Z87891 Personal history of nicotine dependence: Secondary | ICD-10-CM | POA: Diagnosis not present

## 2017-09-22 DIAGNOSIS — D0512 Intraductal carcinoma in situ of left breast: Secondary | ICD-10-CM

## 2017-09-22 DIAGNOSIS — I10 Essential (primary) hypertension: Secondary | ICD-10-CM | POA: Insufficient documentation

## 2017-09-22 HISTORY — PX: BREAST LUMPECTOMY WITH RADIOACTIVE SEED LOCALIZATION: SHX6424

## 2017-09-22 LAB — POCT PREGNANCY, URINE: Preg Test, Ur: NEGATIVE

## 2017-09-22 SURGERY — BREAST LUMPECTOMY WITH RADIOACTIVE SEED LOCALIZATION
Anesthesia: General | Site: Breast | Laterality: Left

## 2017-09-22 MED ORDER — DIPHENHYDRAMINE HCL 50 MG/ML IJ SOLN
INTRAMUSCULAR | Status: DC | PRN
  Administered 2017-09-22: 12.5 mg via INTRAVENOUS

## 2017-09-22 MED ORDER — BUPIVACAINE-EPINEPHRINE 0.25% -1:200000 IJ SOLN
INTRAMUSCULAR | Status: AC
  Filled 2017-09-22: qty 1

## 2017-09-22 MED ORDER — MIDAZOLAM HCL 2 MG/2ML IJ SOLN
INTRAMUSCULAR | Status: AC
  Filled 2017-09-22: qty 2

## 2017-09-22 MED ORDER — 0.9 % SODIUM CHLORIDE (POUR BTL) OPTIME
TOPICAL | Status: DC | PRN
  Administered 2017-09-22: 1000 mL

## 2017-09-22 MED ORDER — LACTATED RINGERS IV SOLN
INTRAVENOUS | Status: DC | PRN
  Administered 2017-09-22: 08:00:00 via INTRAVENOUS

## 2017-09-22 MED ORDER — OXYCODONE HCL 5 MG PO TABS
5.0000 mg | ORAL_TABLET | Freq: Four times a day (QID) | ORAL | 0 refills | Status: DC | PRN
Start: 2017-09-22 — End: 2017-10-01

## 2017-09-22 MED ORDER — BUPIVACAINE-EPINEPHRINE 0.25% -1:200000 IJ SOLN
INTRAMUSCULAR | Status: DC | PRN
  Administered 2017-09-22: 50 mL

## 2017-09-22 MED ORDER — EPHEDRINE SULFATE-NACL 50-0.9 MG/10ML-% IV SOSY
PREFILLED_SYRINGE | INTRAVENOUS | Status: DC | PRN
  Administered 2017-09-22 (×3): 5 mg via INTRAVENOUS

## 2017-09-22 MED ORDER — LIDOCAINE 2% (20 MG/ML) 5 ML SYRINGE
INTRAMUSCULAR | Status: DC | PRN
  Administered 2017-09-22: 60 mg via INTRAVENOUS

## 2017-09-22 MED ORDER — PROPOFOL 10 MG/ML IV BOLUS
INTRAVENOUS | Status: AC
  Filled 2017-09-22: qty 20

## 2017-09-22 MED ORDER — DEXAMETHASONE SODIUM PHOSPHATE 10 MG/ML IJ SOLN
INTRAMUSCULAR | Status: DC | PRN
  Administered 2017-09-22: 10 mg via INTRAVENOUS

## 2017-09-22 MED ORDER — FENTANYL CITRATE (PF) 250 MCG/5ML IJ SOLN
INTRAMUSCULAR | Status: AC
  Filled 2017-09-22: qty 5

## 2017-09-22 MED ORDER — ONDANSETRON HCL 4 MG/2ML IJ SOLN
INTRAMUSCULAR | Status: DC | PRN
  Administered 2017-09-22: 4 mg via INTRAVENOUS

## 2017-09-22 MED ORDER — MIDAZOLAM HCL 5 MG/5ML IJ SOLN
INTRAMUSCULAR | Status: DC | PRN
  Administered 2017-09-22 (×2): 1 mg via INTRAVENOUS

## 2017-09-22 MED ORDER — FENTANYL CITRATE (PF) 250 MCG/5ML IJ SOLN
INTRAMUSCULAR | Status: DC | PRN
  Administered 2017-09-22 (×2): 25 ug via INTRAVENOUS

## 2017-09-22 MED ORDER — HYDROMORPHONE HCL 1 MG/ML IJ SOLN: 0.2500 mg | INTRAMUSCULAR | Status: DC | PRN

## 2017-09-22 MED ORDER — PROPOFOL 10 MG/ML IV BOLUS
INTRAVENOUS | Status: DC | PRN
  Administered 2017-09-22: 50 mg via INTRAVENOUS
  Administered 2017-09-22: 150 mg via INTRAVENOUS

## 2017-09-22 MED FILL — oxyCODONE HCL 5 MG TABS: 5 | 4 days supply | Qty: 12 | Fill #0

## 2017-09-22 SURGICAL SUPPLY — 52 items
ADH SKN CLS APL DERMABOND .7 (GAUZE/BANDAGES/DRESSINGS) ×1
APPLIER CLIP 9.375 MED OPEN (MISCELLANEOUS) ×3
APR CLP MED 9.3 20 MLT OPN (MISCELLANEOUS) ×1
BINDER BREAST LRG (GAUZE/BANDAGES/DRESSINGS) ×2 IMPLANT
BINDER BREAST XLRG (GAUZE/BANDAGES/DRESSINGS) IMPLANT
BLADE SURG 15 STRL LF DISP TIS (BLADE) ×1 IMPLANT
BLADE SURG 15 STRL SS (BLADE) ×3
CANISTER SUCT 3000ML PPV (MISCELLANEOUS) IMPLANT
CHLORAPREP W/TINT 26ML (MISCELLANEOUS) ×3 IMPLANT
CLIP APPLIE 9.375 MED OPEN (MISCELLANEOUS) IMPLANT
COVER PROBE W GEL 5X96 (DRAPES) ×3 IMPLANT
COVER SURGICAL LIGHT HANDLE (MISCELLANEOUS) ×2 IMPLANT
DERMABOND ADVANCED (GAUZE/BANDAGES/DRESSINGS) ×2
DERMABOND ADVANCED .7 DNX12 (GAUZE/BANDAGES/DRESSINGS) ×1 IMPLANT
DEVICE DUBIN SPECIMEN MAMMOGRA (MISCELLANEOUS) ×3 IMPLANT
DRAPE CHEST BREAST 15X10 FENES (DRAPES) ×3 IMPLANT
DRAPE UTILITY XL STRL (DRAPES) ×3 IMPLANT
ELECT CAUTERY BLADE 6.4 (BLADE) ×3 IMPLANT
ELECT REM PT RETURN 9FT ADLT (ELECTROSURGICAL) ×3
ELECTRODE REM PT RTRN 9FT ADLT (ELECTROSURGICAL) ×1 IMPLANT
GLOVE BIO SURGEON STRL SZ8 (GLOVE) ×3 IMPLANT
GLOVE BIOGEL PI IND STRL 6.5 (GLOVE) IMPLANT
GLOVE BIOGEL PI IND STRL 8 (GLOVE) ×1 IMPLANT
GLOVE BIOGEL PI INDICATOR 6.5 (GLOVE) ×4
GLOVE BIOGEL PI INDICATOR 8 (GLOVE) ×4
GLOVE SURG SS PI 6.5 STRL IVOR (GLOVE) ×4 IMPLANT
GOWN STRL REUS W/ TWL LRG LVL3 (GOWN DISPOSABLE) ×1 IMPLANT
GOWN STRL REUS W/ TWL XL LVL3 (GOWN DISPOSABLE) ×1 IMPLANT
GOWN STRL REUS W/TWL LRG LVL3 (GOWN DISPOSABLE) ×6
GOWN STRL REUS W/TWL XL LVL3 (GOWN DISPOSABLE) ×3
KIT BASIN OR (CUSTOM PROCEDURE TRAY) ×3 IMPLANT
KIT MARKER MARGIN INK (KITS) ×3 IMPLANT
LIGHT WAVEGUIDE WIDE FLAT (MISCELLANEOUS) ×1 IMPLANT
NDL HYPO 25GX1X1/2 BEV (NEEDLE) ×1 IMPLANT
NEEDLE HYPO 25GX1X1/2 BEV (NEEDLE) ×3 IMPLANT
NS IRRIG 1000ML POUR BTL (IV SOLUTION) IMPLANT
PACK SURGICAL SETUP 50X90 (CUSTOM PROCEDURE TRAY) ×3 IMPLANT
PENCIL BUTTON HOLSTER BLD 10FT (ELECTRODE) ×3 IMPLANT
SPONGE LAP 18X18 X RAY DECT (DISPOSABLE) ×3 IMPLANT
SUT MNCRL AB 4-0 PS2 18 (SUTURE) ×3 IMPLANT
SUT SILK 2 0 SH (SUTURE) IMPLANT
SUT VIC AB 2-0 SH 27 (SUTURE) ×3
SUT VIC AB 2-0 SH 27XBRD (SUTURE) ×1 IMPLANT
SUT VIC AB 3-0 SH 27 (SUTURE) ×3
SUT VIC AB 3-0 SH 27X BRD (SUTURE) ×1 IMPLANT
SYR BULB 3OZ (MISCELLANEOUS) ×3 IMPLANT
SYR CONTROL 10ML LL (SYRINGE) ×3 IMPLANT
TOWEL OR 17X24 6PK STRL BLUE (TOWEL DISPOSABLE) ×3 IMPLANT
TOWEL OR 17X26 10 PK STRL BLUE (TOWEL DISPOSABLE) ×1 IMPLANT
TUBE CONNECTING 12'X1/4 (SUCTIONS)
TUBE CONNECTING 12X1/4 (SUCTIONS) IMPLANT
YANKAUER SUCT BULB TIP NO VENT (SUCTIONS) IMPLANT

## 2017-09-22 NOTE — Anesthesia Postprocedure Evaluation (Signed)
Anesthesia Post Note  Patient: Sara Freeman  Procedure(s) Performed: BREAST LUMPECTOMY  WITH RADIOACTIVE SEED LOCALIZATION X 2 (Left Breast)     Patient location during evaluation: PACU Anesthesia Type: General Level of consciousness: awake and alert Pain management: pain level controlled Vital Signs Assessment: post-procedure vital signs reviewed and stable Respiratory status: spontaneous breathing, nonlabored ventilation and respiratory function stable Cardiovascular status: blood pressure returned to baseline and stable Postop Assessment: no apparent nausea or vomiting Anesthetic complications: no    Last Vitals:  Vitals:   09/22/17 1005 09/22/17 1019  BP: (!) 142/92 (!) 147/92  Pulse: 93 96  Resp: 18 14  Temp:  (!) 36.4 C  SpO2: 98% 100%    Last Pain:  Vitals:   09/22/17 1019  PainSc: 2                  Raysean Graumann,W. EDMOND

## 2017-09-22 NOTE — Interval H&P Note (Signed)
History and Physical Interval Note:  09/22/2017 8:21 AM  Sara Freeman  has presented today for surgery, with the diagnosis of LEFT BREAST DCIS  The various methods of treatment have been discussed with the patient and family. After consideration of risks, benefits and other options for treatment, the patient has consented to  Procedure(s): BREAST LUMPECTOMY X 2 WITH RADIOACTIVE SEED LOCALIZATION X 2 (Left) as a surgical intervention .  The patient's history has been reviewed, patient examined, no change in status, stable for surgery.  I have reviewed the patient's chart and labs.  Questions were answered to the patient's satisfaction.     [place]

## 2017-09-22 NOTE — Discharge Instructions (Signed)
[facility] °Office Phone Number [phone] ° °BREAST BIOPSY/ PARTIAL MASTECTOMY: POST OP INSTRUCTIONS ° °Always review your discharge instruction sheet given to you by the facility where your surgery was performed. ° °IF YOU HAVE DISABILITY OR FAMILY LEAVE FORMS, YOU MUST BRING THEM TO THE OFFICE FOR PROCESSING.  DO NOT GIVE THEM TO YOUR DOCTOR. ° °1. A prescription for pain medication may be given to you upon discharge.  Take your pain medication as prescribed, if needed.  If narcotic pain medicine is not needed, then you may take acetaminophen (Tylenol) or ibuprofen (Advil) as needed. °2. Take your usually prescribed medications unless otherwise directed °3. If you need a refill on your pain medication, please contact your pharmacy.  They will contact our office to request authorization.  Prescriptions will not be filled after 5pm or on week-ends. °4. You should eat very light the first 24 hours after surgery, such as soup, crackers, pudding, etc.  Resume your normal diet the day after surgery. °5. Most patients will experience some swelling and bruising in the breast.  Ice packs and a good support bra will help.  Swelling and bruising can take several days to resolve.  °6. It is common to experience some constipation if taking pain medication after surgery.  Increasing fluid intake and taking a stool softener will usually help or prevent this problem from occurring.  A mild laxative (Milk of Magnesia or Miralax) should be taken according to package directions if there are no bowel movements after 48 hours. °7. Unless discharge instructions indicate otherwise, you may remove your bandages 24-48 hours after surgery, and you may shower at that time.  You may have steri-strips (small skin tapes) in place directly over the incision.  These strips should be left on the skin for 7-10 days.  If your surgeon used skin glue on the incision, you may shower in 24 hours.  The glue will flake off over the  next 2-3 weeks.  Any sutures or staples will be removed at the office during your follow-up visit. °8. ACTIVITIES:  You may resume regular daily activities (gradually increasing) beginning the next day.  Wearing a good support bra or sports bra minimizes pain and swelling.  You may have sexual intercourse when it is comfortable. °a. You may drive when you no longer are taking prescription pain medication, you can comfortably wear a seatbelt, and you can safely maneuver your car and apply brakes. °b. RETURN TO WORK:  ______________________________________________________________________________________ °9. You should see your doctor in the office for a follow-up appointment approximately two weeks after your surgery.  Your doctor’s nurse will typically make your follow-up appointment when she calls you with your pathology report.  Expect your pathology report 2-3 business days after your surgery.  You may call to check if you do not hear from us after three days. °10. OTHER INSTRUCTIONS: _______________________________________________________________________________________________ _____________________________________________________________________________________________________________________________________ °_____________________________________________________________________________________________________________________________________ °_____________________________________________________________________________________________________________________________________ ° °WHEN TO CALL YOUR DOCTOR: °1. Fever over 101.0 °2. Nausea and/or vomiting. °3. Extreme swelling or bruising. °4. Continued bleeding from incision. °5. Increased pain, redness, or drainage from the incision. ° °The clinic staff is available to answer your questions during regular business hours.  Please don’t hesitate to call and ask to speak to one of the nurses for clinical concerns.  If you have a medical emergency, go to the nearest  emergency room or call 911.  A surgeon from [facility] is always on call at the hospital. ° °For further questions, please visit [email or web address]  °

## 2017-09-22 NOTE — Transfer of Care (Signed)
Immediate Anesthesia Transfer of Care Note  Patient: Sara Freeman  Procedure(s) Performed: BREAST LUMPECTOMY X 2 WITH RADIOACTIVE SEED LOCALIZATION X 2 (Left Breast)  Patient Location: PACU  Anesthesia Type:General  Level of Consciousness: awake, alert  and oriented  Airway & Oxygen Therapy: Patient Spontanous Breathing and Patient connected to nasal cannula oxygen  Post-op Assessment: Report given to RN and Post -op Vital signs reviewed and stable  Post vital signs: Reviewed and stable  Last Vitals:  Vitals:   09/22/17 0645  BP: 123/83  Pulse: 80  Resp: 18  Temp: 37 C  SpO2: 99%   HR 94, RR 15, sats 100%, bP 133/73  Last Pain: There were no vitals filed for this visit.    Patients Stated Pain Goal: 0 (89/16/94 5038)  Complications: No apparent anesthesia complications

## 2017-09-22 NOTE — Anesthesia Procedure Notes (Signed)
Procedure Name: LMA Insertion Date/Time: 09/22/2017 8:31 AM Performed by: Wilburn Cornelia, CRNA Pre-anesthesia Checklist: Patient identified, Emergency Drugs available, Suction available, Patient being monitored and Timeout performed Patient Re-evaluated:Patient Re-evaluated prior to induction Oxygen Delivery Method: Circle system utilized Preoxygenation: Pre-oxygenation with 100% oxygen Induction Type: IV induction Ventilation: Mask ventilation without difficulty LMA: LMA inserted LMA Size: 4.0 Placement Confirmation: positive ETCO2 and breath sounds checked- equal and bilateral Dental Injury: Teeth and Oropharynx as per pre-operative assessment

## 2017-09-22 NOTE — Op Note (Signed)
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  Margins were grossly clear.  IV fluids: Per anesthesia record  Indications for procedure: The patient presents for breast conserving surgery for left breast DCIS diagnosed by mammography and core biopsy.  Options were discussed of mastectomy versus breast conservation.  Adjuvant therapy was discussed with the patient.  Potential complications of each option and long-term expectations and outcomes of each discussed.  She opted for breast conservation after discussion.The procedure has been discussed with the patient. Alternatives to surgery have been discussed with the patient.  Risks of surgery include bleeding,  Infection,  Seroma formation, death,  and the need for further surgery.   The patient understands and wishes to proceed.    Description of procedure: The patient was met in the holding area.  She underwent seed localization as an outpatient.  The left breast was marked as the correct side.  She was taken back to the operating room.  She was placed supine on the operating room table.  After induction of LMA anesthesia, left breast was prepped and draped in sterile fashion.  Timeout was done and she received appropriate preoperative antibiotics.  Her films were available for review.  Neoprobe was used in both areas were marked with a pen.  A curvilinear incision was made along the inferior border of the nipple areolar complex.  Dissection was carried down toward the central left lower outer quadrant of the breast.  Neoprobe was used to guide dissection.  All tissue around both seeds and clip were excised with a grossly  negative margin.  Hemostasis was achieved with cautery.  Intraoperative assessment revealed both seeds in both clips to be in the specimen and this was sent to pathology.  The wound was made hemostatic with cautery.  I mobilized the central breast tissue from below the nipple for approximately 4 cm.  I was able to pull this down to fill the defect in the lower outer left breast.  This was closed with 3-0 Vicryl after clipping the cavity.  4-0 Monocryl was used to close the skin in a subcuticular fashion.  Dermabond applied.  All final counts found to be correct.  The patient was awoke extubated taken recovery in satisfactory condition.  Breast binder placed.

## 2017-09-23 ENCOUNTER — Encounter (HOSPITAL_COMMUNITY): Payer: Self-pay | Admitting: Surgery

## 2017-09-25 ENCOUNTER — Telehealth: Payer: No Typology Code available for payment source | Admitting: Family

## 2017-09-25 DIAGNOSIS — J309 Allergic rhinitis, unspecified: Secondary | ICD-10-CM | POA: Diagnosis not present

## 2017-09-25 MED ORDER — FLUTICASONE PROPIONATE 50 MCG/ACT NA SUSP: 2.0000 | Freq: Every day | NASAL | 6 refills | Status: DC

## 2017-09-25 NOTE — Progress Notes (Signed)
Thank you for the details you included in the comment boxes. Those details are very helpful in determining the best course of treatment for you and help [place] to provide the best care.  E visit for Allergic Rhinitis We are sorry that you are not feeling well.  Her is how we plan to help!  Based on what you have shared with me it looks like you have Allergic Rhinitis.  Rhinitis is when a reaction occurs that causes nasal congestion, runny nose, sneezing, and itching.  Most types of rhinitis are caused by an inflammation and are associated with symptoms in the eyes ears or throat. There are several types of rhinitis.  The most common are acute rhinitis, which is usually caused by a viral illness, allergic or seasonal rhinitis, and nonallergic or year-round rhinitis.  Nasal allergies occur certain times of the year.  Allergic rhinitis is caused when allergens in the air trigger the release of histamine in the body.  Histamine causes itching, swelling, and fluid to build up in the fragile linings of the nasal passages, sinuses and eyelids.  An itchy nose and clear discharge are common.  I recommend the following over the counter treatments: Xyzal 5 mg take 1 tablet daily  I also would recommend a nasal spray: (I sent the prescription now) Flonase 2 sprays into each nostril once daily  You may also benefit from eye drops such as: Visine 1-2 drops each eye twice daily as needed  HOME CARE:   You can use an over-the-counter saline nasal spray as needed  Avoid areas where there is heavy dust, mites, or molds  Stay indoors on windy days during the pollen season  Keep windows closed in home, at least in bedroom; use air conditioner.  Use high-efficiency house air filter  Keep windows closed in car, turn AC on re-circulate  Avoid playing out with dog during pollen season  GET HELP RIGHT AWAY IF:   If your symptoms do not improve within 10 days  You become short of breath  You develop  yellow or green discharge from your nose for over 3 days  You have coughing fits  MAKE SURE YOU:   Understand these instructions  Will watch your condition  Will get help right away if you are not doing well or get worse  Thank you for choosing an e-visit. Your e-visit answers were reviewed by a board certified advanced clinical practitioner to complete your personal care plan. Depending upon the condition, your plan could have included both over the counter or prescription medications. Please review your pharmacy choice. Be sure that the pharmacy you have chosen is open so that you can pick up your prescription now.  If there is a problem you may message your provider in [place] to have the prescription routed to another pharmacy. Your safety is important to [place]. If you have drug allergies check your prescription carefully.  For the next 24 hours, you can use MyChart to ask questions about today's visit, request a non-urgent call back, or ask for a work or school excuse from your e-visit provider. You will get an email in the next two days asking about your experience. I hope that your e-visit has been valuable and will speed your recovery.

## 2017-10-01 ENCOUNTER — Inpatient Hospital Stay (HOSPITAL_COMMUNITY): Payer: No Typology Code available for payment source | Attending: Hematology | Admitting: Hematology

## 2017-10-01 ENCOUNTER — Encounter (HOSPITAL_COMMUNITY): Payer: Self-pay | Admitting: Hematology

## 2017-10-01 VITALS — BP 126/74 | HR 81 | Temp 98.6°F | Resp 18 | Ht 63.0 in | Wt 168.6 lb

## 2017-10-01 DIAGNOSIS — Z803 Family history of malignant neoplasm of breast: Secondary | ICD-10-CM | POA: Insufficient documentation

## 2017-10-01 DIAGNOSIS — Z87891 Personal history of nicotine dependence: Secondary | ICD-10-CM | POA: Insufficient documentation

## 2017-10-01 DIAGNOSIS — Z170 Estrogen receptor positive status [ER+]: Secondary | ICD-10-CM | POA: Insufficient documentation

## 2017-10-01 DIAGNOSIS — D0512 Intraductal carcinoma in situ of left breast: Secondary | ICD-10-CM | POA: Diagnosis not present

## 2017-10-01 NOTE — Progress Notes (Signed)
[facility] CONSULT NOTE  Patient Care Team: Caren Macadam, MD as PCP - General (Family Medicine)  CHIEF COMPLAINTS/PURPOSE OF CONSULTATION:  Newly diagnosed left breast DCIS.  HISTORY OF PRESENTING ILLNESS:  Sara Freeman 47 y.o. female is here for newly diagnosed left breast DCIS.  She was found to have left breast calcifications on mammogram 6 months ago.  Follow-up mammogram showed slight increase in calcification in the central and lower quadrant.  She underwent biopsy on 08/27/2017 which showed DCIS in 2 different areas.  This was ER and PR positive.  She underwent lumpectomy on 09/22/2017.  She is here for recommendations regarding her new diagnosis. I reviewed her records extensively and collaborated the history with the patient.  She is fully active and works at [employer] office.  In terms of breast cancer risk profile:  She menarched at early age of 84 and is premenopausal. She had one pregnancy, her first child was born at age 50. She used birth control pills for approximately for 6 years.  She never used hormone replacement therapy. Her maternal great aunt had breast cancer in her 63s.  MEDICAL HISTORY:  Past Medical History:  Diagnosis Date  . Allergy   . Breast cancer ([place])   . Breast cancer ([place]) 09/09/2017  . Endometriosis   . Family history of breast cancer   . Hypertension     SURGICAL HISTORY: Past Surgical History:  Procedure Laterality Date  . BREAST BIOPSY Left 2017   benign  . BREAST BIOPSY Left 08/27/2017   Needle core biopsy  . BREAST LUMPECTOMY WITH RADIOACTIVE SEED LOCALIZATION Left 09/22/2017   Procedure: BREAST LUMPECTOMY  WITH RADIOACTIVE SEED LOCALIZATION X 2;  Surgeon: Erroll Luna, MD;  Location: [facility];  Service: General;  Laterality: Left;  . TUBAL LIGATION  1999   [place]    SOCIAL HISTORY: Social History   Socioeconomic History  . Marital status: Married    Spouse name: [name]  . Number of children: 1  . Years of  education: 46  . Highest education level: Associate degree: academic program  Social Needs  . Financial resource strain: Not hard at all  . Food insecurity - worry: Never true  . Food insecurity - inability: Never true  . Transportation needs - medical: No  . Transportation needs - non-medical: No  Occupational History  . 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 Smoking status: Former Smoker    Packs/day: 1.00    Types: Cigarettes    Start date: 07/20/1986    Last attempt to quit: 07/20/2006    Years since quitting: 11.2  . Smokeless tobacco: Never Used  Substance and Sexual Activity  . Alcohol use: No  . Drug use: No  . Sexual activity: Yes    Birth control/protection: Surgical    Comment: tubal  Other Topics Concern  . Not on file  Social History Narrative   Lives with husband Kandra Nicolas is at home but spends  Time with his GF   Has 2 dachshund   Likes to read   Goes camping once a month    FAMILY HISTORY: Family History  Problem Relation Age of Onset  . CAD Other   . Heart disease Other   . Stroke Paternal Grandfather   . Stroke Paternal Grandmother   . Aneurysm Maternal Grandfather   . Heart attack Father   . Hypertension Father   . COPD Father   .  Heart disease Father        29 died  . Thyroid disease Mother   . Hypertension Mother   . Hypertension Brother   . Stroke Paternal Uncle   . Breast cancer Other 60       metastatic    ALLERGIES:  is allergic to penicillins and azithromycin.  MEDICATIONS:  Current Outpatient Medications  Medication Sig Dispense Refill  . acetaminophen (TYLENOL) 500 MG tablet Take 500-1,000 mg by mouth every 6 (six) hours as needed (for pain.).    Marland Kitchen chlorthalidone (HYGROTON) 25 MG tablet Take 1 tablet (25 mg total) daily by mouth. 90 tablet 3  . fluticasone (FLONASE) 50 MCG/ACT nasal spray Place 2 sprays into both nostrils daily. 16 g 6  . ibuprofen (ADVIL,MOTRIN) 200 MG tablet Take 400 mg by mouth  every 8 (eight) hours as needed (for pain.).    Marland Kitchen lisinopril (PRINIVIL,ZESTRIL) 5 MG tablet Take 1 tablet (5 mg total) by mouth daily. 90 tablet 1  . naproxen sodium (ALEVE) 220 MG tablet Take 440 mg by mouth 2 (two) times daily as needed (for pain.).    Marland Kitchen CINNAMON PO Take 1,000 mg by mouth daily.     No current facility-administered medications for this visit.     REVIEW OF SYSTEMS:   Constitutional: Denies fevers, chills or abnormal night sweats Eyes: Denies blurriness of vision, double vision or watery eyes Ears, nose, mouth, throat, and face: Denies mucositis or sore throat Respiratory: Denies cough, dyspnea or wheezes Cardiovascular: Denies palpitation, chest discomfort or lower extremity swelling Gastrointestinal:  Denies nausea, heartburn or change in bowel habits Skin: Denies abnormal skin rashes Lymphatics: Denies new lymphadenopathy or easy bruising Neurological:Denies numbness, tingling or new weaknesses Behavioral/Psych: Mood is stable, no new changes  Breast:  Denies any palpable lumps or discharge All other systems were reviewed with the patient and are negative.  PHYSICAL EXAMINATION: ECOG PERFORMANCE STATUS: 0 - Asymptomatic  Vitals:   10/01/17 1304  BP: 126/74  Pulse: 81  Resp: 18  Temp: 98.6 F (37 C)  SpO2: 99%   Filed Weights   10/01/17 1304  Weight: 168 lb 9.6 oz (76.5 kg)    GENERAL:alert, no distress and comfortable SKIN: skin color, texture, turgor are normal, no rashes or significant lesions EYES: normal, conjunctiva are pink and non-injected, sclera clear OROPHARYNX:no exudate, no erythema and lips, buccal mucosa, and tongue normal  NECK: supple, thyroid normal size, non-tender, without nodularity LYMPH:  no palpable lymphadenopathy in the cervical, axillary or inguinal LUNGS: clear to auscultation and percussion with normal breathing effort HEART: regular rate & rhythm and no murmurs and no lower extremity edema ABDOMEN:abdomen soft,  non-tender and normal bowel sounds Musculoskeletal:no cyanosis of digits and no clubbing  PSYCH: alert & oriented x 3 with fluent speech NEURO: no focal motor/sensory deficits BREAST: Left breast lumpectomy scar around the areola is well-healed.  No palpable masses in the right breast.  No palpable axillary or supraclavicular adenopathy. LABORATORY DATA:  I have reviewed the data as listed Lab Results  Component Value Date   WBC 11.4 (H) 09/16/2017   HGB 13.5 09/16/2017   HCT 39.6 09/16/2017   MCV 86.3 09/16/2017   PLT 264 09/16/2017   Lab Results  Component Value Date   NA 140 09/16/2017   K 3.6 09/16/2017   CL 101 09/16/2017   CO2 28 09/16/2017    RADIOGRAPHIC STUDIES: I have personally reviewed the radiological reports and agreed with the findings in the report.  ASSESSMENT AND PLAN:  1.Left breast DCIS: I have reviewed the biopsy and pathology report with the patient and her husband in detail.  She has a 1.2 cm high-grade DCIS, LCIS with no evidence of infiltrating ductal carcinoma.  ER/PR were positive.  She has a positive inferior border.  The rest of the margins were close.  She has an appointment to see Dr. Brantley Stage next week.  I have asked her to follow-up with him, regarding his recommendations for reexcision of positive margin.  She has already seen Dr. Lisbeth Renshaw in [place] for radiation.  Given her high grade DCIS and young age, I have recommended endocrine therapy for 5 years with tamoxifen.  Available data suggests endocrine therapy provides risk reduction in the ipsilateral breast and in the contralateral breast in ER positive tumors.  I have also told her that there was no survival advantage.  We briefly talked about the side effects of tamoxifen.  I will ask her to come back in 4 weeks for follow-up.  All questions were answered. The patient knows to call the clinic with any problems, questions or concerns.    Derek Jack, MD 10/01/17

## 2017-10-08 ENCOUNTER — Ambulatory Visit: Payer: Self-pay | Admitting: Surgery

## 2017-10-08 NOTE — H&P (Signed)
Sara Freeman Documented: 10/08/2017 8:33 AM Location: [facility] Patient #: [id number] DOB: 1970-08-27 Married / Language: [name] / Race: White Female  History of Present Illness [name] A. 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 The area shows close margins one focally involved margin. There is no evidence of invasive disease. She complains of sensitivity around the nipple. There is no drainage or redness of the incision she states.                     Diagnosis Breast, lumpectomy, Left w/ seeds x2 - DUCTAL CARCINOMA IN SITU (DCIS), HIGH-GRADE, WITH COMEDO NECROSIS AND MICROCALCIFICATIONS - LOBULAR CARCINOMA IN-SITU (LCIS) - NEGATIVE FOR INVASIVE CARCINOMA - THE INFERIOR MARGIN IS FOCALLY INVOLVED BY DCIS AND IN ADDITION, THE INFERIOR, ANTERIOR, POSTERIOR AND LATERAL MARGINS ARE BROADLY LESS THAN 0.1 CM FROM DCIS - BIOPSY SITE CHANGES - SEE ONCOLOGY TABLE Microscopic Comment BREAST, IN SITU CARCINOMA Specimen, including laterality: Left Procedure (include lymph node sampling sentinel-non-sentinel): Lumpectomy without lymph node sampling Grade of carcinoma: High-grade Necrosis: Present, comedo type Estimated tumor size: 1.2 cm (largest contiguous focus - measured on glass slide) Distance to closest margin: Inferior margin is positive; Anterior, Posterior and Lateral margins are broadly less than 0.1 cm If margin positive, focally or broadly: Inferior margin is focally involved but broadly less than 0.1 cm from the DCIS Breast prognostic profile: Performed on the previous specimen from this patient ([id number]) Lymph nodes: Examined: 0 Sentinel 0 Non-sentinel 0 Total 1 of 2 FINAL for Embleton, Joelys H ([id number]).  The patient is a 47 year old female.   Allergies (Tanisha A. Owens Shark, [place]; 10/08/2017 8:33 AM) No Known Drug Allergies [09/02/2017]: Allergies  Reconciled  Medication History (Tanisha A. Owens Shark, RMA; 10/08/2017 8:34 AM) Chlorthalidone (25MG  Tablet, Oral) Active. Lisinopril (5MG  Tablet, Oral) Active. Medications Reconciled    Vitals (Tanisha A. Brown RMA; 10/08/2017 8:33 AM) 10/08/2017 8:33 AM Weight: 169.8 lb Height: 63in Body Surface Area: 1.8 m Body Mass Index: 30.08 kg/m  Temp.: 98.8F  Pulse: 84 (Regular)  BP: 126/88 (Sitting, Left Arm, Standard)      Physical Exam (Lawrence Mitch A. Mukesh Kornegay MD; 10/08/2017 10:18 AM)  Breast Note: Right breast incision on the inferior border of the nipple complex is clean dry and intact. No evidence of redness or stroma.    Assessment & Plan (Yogi Arther A. Rashaad Hallstrom MD; 10/08/2017 10:19 AM)  POST-OPERATIVE STATE [phone]) Impression: Rec recommended reexcision lumpectomy of all of the cavity given close in focally positive margin. No evidence of invasive disease therefore mapping of lymph node not necessary. Risks and benefits of this discussed. Rationale for doing this discussed with the patient. She agrees to proceed with reexcision right breast lumpectomy.  Current Plans You are being scheduled for surgery- Our schedulers will call you.  You should hear from our office's scheduling department within 5 working days about the location, date, and time of surgery. We try to make accommodations for patient's preferences in scheduling surgery, but sometimes the OR schedule or the surgeon's schedule prevents [place] from making those accommodations.  If you have not heard from our office [phone]) in 5 working days, call the office and ask for your surgeon's nurse.  If you have other questions about your diagnosis, plan, or surgery, call the office and ask for your surgeon's nurse.  Pt Education - [facility] Breast Biopsy HCI: discussed with patient and provided information. We discussed the staging and pathophysiology of  breast cancer. We discussed all of the different options for  treatment for breast cancer including surgery, chemotherapy, radiation therapy, Herceptin, and antiestrogen therapy. We discussed a sentinel lymph node biopsy as she does not appear to having lymph node involvement right now. We discussed the performance of that with injection of radioactive tracer and blue dye. We discussed that she would have an incision underneath her axillary hairline. We discussed that there is a bout a 10-20% chance of having a positive node with a sentinel lymph node biopsy and we will await the permanent pathology to make any other first further decisions in terms of her treatment. One of these options might be to return to the operating room to perform an axillary lymph node dissection. We discussed about a 1-2% risk lifetime of chronic shoulder pain as well as lymphedema associated with a sentinel lymph node biopsy. We discussed the options for treatment of the breast cancer which included lumpectomy versus a mastectomy. We discussed the performance of the lumpectomy with a wire placement. We discussed a 10-20% chance of a positive margin requiring reexcision in the operating room. We also discussed that she may need radiation therapy or antiestrogen therapy or both if she undergoes lumpectomy. We discussed the mastectomy and the postoperative care for that as well. We discussed that there is no difference in her survival whether she undergoes lumpectomy with radiation therapy or antiestrogen therapy versus a mastectomy. There is a slight difference in the local recurrence rate being 3-5% with lumpectomy and about 1% with a mastectomy. We discussed the risks of operation including bleeding, infection, possible reoperation. She understands her further therapy will be based on what her stages at the time of her operation.

## 2017-10-08 NOTE — H&P (View-Only) (Signed)
[name] Documented: 10/08/2017 8:33 AM Location: [facility] Patient #: [id number] DOB: March 28, 1971 Married / Language: Sara Freeman / Race: White Female  History of Present Illness Sara Moores A. 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 The area shows close margins one focally involved margin. There is no evidence of invasive disease. She complains of sensitivity around the nipple. There is no drainage or redness of the incision she states.                     Diagnosis Breast, lumpectomy, Left w/ seeds x2 - DUCTAL CARCINOMA IN SITU (DCIS), HIGH-GRADE, WITH COMEDO NECROSIS AND MICROCALCIFICATIONS - LOBULAR CARCINOMA IN-SITU (LCIS) - NEGATIVE FOR INVASIVE CARCINOMA - THE INFERIOR MARGIN IS FOCALLY INVOLVED BY DCIS AND IN ADDITION, THE INFERIOR, ANTERIOR, POSTERIOR AND LATERAL MARGINS ARE BROADLY LESS THAN 0.1 CM FROM DCIS - BIOPSY SITE CHANGES - SEE ONCOLOGY TABLE Microscopic Comment BREAST, IN SITU CARCINOMA Specimen, including laterality: Left Procedure (include lymph node sampling sentinel-non-sentinel): Lumpectomy without lymph node sampling Grade of carcinoma: High-grade Necrosis: Present, comedo type Estimated tumor size: 1.2 cm (largest contiguous focus - measured on glass slide) Distance to closest margin: Inferior margin is positive; Anterior, Posterior and Lateral margins are broadly less than 0.1 cm If margin positive, focally or broadly: Inferior margin is focally involved but broadly less than 0.1 cm from the DCIS Breast prognostic profile: Performed on the previous specimen from this patient ([id number]) Lymph nodes: Examined: 0 Sentinel 0 Non-sentinel 0 Total 1 of 2 FINAL for Sara Freeman ([id number]).  The patient is a 47 year old female.   Allergies (Sara Freeman, Sara Freeman; 10/08/2017 8:33 AM) No Known Drug Allergies [09/02/2017]: Allergies  Reconciled  Medication History (Sara Freeman, RMA; 10/08/2017 8:34 AM) Chlorthalidone (25MG  Tablet, Oral) Active. Lisinopril (5MG  Tablet, Oral) Active. Medications Reconciled    Vitals (Sara A. Brown RMA; 10/08/2017 8:33 AM) 10/08/2017 8:33 AM Weight: 169.8 lb Height: 63in Body Surface Area: 1.8 m Body Mass Index: 30.08 kg/m  Temp.: 98.39F  Pulse: 84 (Regular)  BP: 126/88 (Sitting, Left Arm, Standard)      Physical Exam (Sara Trostle A. Aniyah Nobis MD; 10/08/2017 10:18 AM)  Breast Note: Right breast incision on the inferior border of the nipple complex is clean dry and intact. No evidence of redness or stroma.    Assessment & Plan (Sara Sara A. Leman Martinek MD; 10/08/2017 10:19 AM)  POST-OPERATIVE STATE [phone]) Impression: Rec recommended reexcision lumpectomy of all of the cavity given close in focally positive margin. No evidence of invasive disease therefore mapping of lymph node not necessary. Risks and benefits of this discussed. Rationale for doing this discussed with the patient. She agrees to proceed with reexcision right breast lumpectomy.  Current Plans You are being scheduled for surgery- Our schedulers will call you.  You should hear from our office's scheduling department within 5 working days about the location, date, and time of surgery. We try to make accommodations for patient's preferences in scheduling surgery, but sometimes the OR schedule or the surgeon's schedule prevents [place] from making those accommodations.  If you have not heard from our office [phone]) in 5 working days, call the office and ask for your surgeon's nurse.  If you have other questions about your diagnosis, plan, or surgery, call the office and ask for your surgeon's nurse.  Pt Education - [facility] Breast Biopsy HCI: discussed with patient and provided information. We discussed the staging and pathophysiology of  breast cancer. We discussed all of the different options for  treatment for breast cancer including surgery, chemotherapy, radiation therapy, Herceptin, and antiestrogen therapy. We discussed a sentinel lymph node biopsy as she does not appear to having lymph node involvement right now. We discussed the performance of that with injection of radioactive tracer and blue dye. We discussed that she would have an incision underneath her axillary hairline. We discussed that there is a bout a 10-20% chance of having a positive node with a sentinel lymph node biopsy and we will await the permanent pathology to make any other first further decisions in terms of her treatment. One of these options might be to return to the operating room to perform an axillary lymph node dissection. We discussed about a 1-2% risk lifetime of chronic shoulder pain as well as lymphedema associated with a sentinel lymph node biopsy. We discussed the options for treatment of the breast cancer which included lumpectomy versus a mastectomy. We discussed the performance of the lumpectomy with a wire placement. We discussed a 10-20% chance of a positive margin requiring reexcision in the operating room. We also discussed that she may need radiation therapy or antiestrogen therapy or both if she undergoes lumpectomy. We discussed the mastectomy and the postoperative care for that as well. We discussed that there is no difference in her survival whether she undergoes lumpectomy with radiation therapy or antiestrogen therapy versus a mastectomy. There is a slight difference in the local recurrence rate being 3-5% with lumpectomy and about 1% with a mastectomy. We discussed the risks of operation including bleeding, infection, possible reoperation. She understands her further therapy will be based on what her stages at the time of her operation.

## 2017-10-12 ENCOUNTER — Other Ambulatory Visit: Payer: Self-pay

## 2017-10-12 ENCOUNTER — Encounter (HOSPITAL_BASED_OUTPATIENT_CLINIC_OR_DEPARTMENT_OTHER): Payer: Self-pay | Admitting: *Deleted

## 2017-10-15 ENCOUNTER — Encounter (HOSPITAL_BASED_OUTPATIENT_CLINIC_OR_DEPARTMENT_OTHER)
Admission: RE | Admit: 2017-10-15 | Discharge: 2017-10-15 | Disposition: A | Discharge status: Home / Self Care | Payer: No Typology Code available for payment source | Source: Ambulatory Visit | Attending: Surgery | Admitting: Surgery

## 2017-10-15 DIAGNOSIS — Z01818 Encounter for other preprocedural examination: Secondary | ICD-10-CM | POA: Diagnosis present

## 2017-10-15 LAB — BASIC METABOLIC PANEL
Anion gap: 9 (ref 5–15)
BUN: 10 mg/dL (ref 6–20)
CHLORIDE: 99 mmol/L — AB (ref 101–111)
CO2: 29 mmol/L (ref 22–32)
Calcium: 9.5 mg/dL (ref 8.9–10.3)
Creatinine, Ser: 0.74 mg/dL (ref 0.44–1.00)
GFR calc Af Amer: >60 mL/min (ref >60)
GFR calc non Af Amer: >60 mL/min (ref >60)
GLUCOSE: 86 mg/dL (ref 65–99)
POTASSIUM: 4.1 mmol/L (ref 3.5–5.1)
Sodium: 137 mmol/L (ref 135–145)

## 2017-10-15 NOTE — Progress Notes (Signed)
Ensure pre surgery drink given with instructions to complete by 0400 dos,surgical soap given with instructions,  pt verbalized understanding. 

## 2017-10-19 ENCOUNTER — Encounter (HOSPITAL_BASED_OUTPATIENT_CLINIC_OR_DEPARTMENT_OTHER)
Admission: RE | Disposition: A | Discharge status: Home / Self Care | Payer: Self-pay | Source: Ambulatory Visit | Attending: Surgery

## 2017-10-19 ENCOUNTER — Ambulatory Visit (HOSPITAL_BASED_OUTPATIENT_CLINIC_OR_DEPARTMENT_OTHER): Payer: No Typology Code available for payment source | Admitting: Certified Registered"

## 2017-10-19 ENCOUNTER — Other Ambulatory Visit: Payer: Self-pay

## 2017-10-19 ENCOUNTER — Encounter (HOSPITAL_BASED_OUTPATIENT_CLINIC_OR_DEPARTMENT_OTHER): Payer: Self-pay | Admitting: *Deleted

## 2017-10-19 ENCOUNTER — Ambulatory Visit (HOSPITAL_BASED_OUTPATIENT_CLINIC_OR_DEPARTMENT_OTHER)
Admission: RE | Admit: 2017-10-19 | Discharge: 2017-10-19 | Disposition: A | Discharge status: Home / Self Care | Payer: No Typology Code available for payment source | Source: Ambulatory Visit | Attending: Surgery | Admitting: Surgery

## 2017-10-19 DIAGNOSIS — D0592 Unspecified type of carcinoma in situ of left breast: Secondary | ICD-10-CM | POA: Diagnosis present

## 2017-10-19 DIAGNOSIS — D0512 Intraductal carcinoma in situ of left breast: Secondary | ICD-10-CM | POA: Diagnosis not present

## 2017-10-19 DIAGNOSIS — Z79899 Other long term (current) drug therapy: Secondary | ICD-10-CM | POA: Insufficient documentation

## 2017-10-19 DIAGNOSIS — Z87891 Personal history of nicotine dependence: Secondary | ICD-10-CM | POA: Insufficient documentation

## 2017-10-19 DIAGNOSIS — I10 Essential (primary) hypertension: Secondary | ICD-10-CM | POA: Diagnosis not present

## 2017-10-19 HISTORY — PX: BREAST LUMPECTOMY: SHX2

## 2017-10-19 SURGERY — BREAST LUMPECTOMY
Anesthesia: General | Site: Breast | Laterality: Left

## 2017-10-19 MED ORDER — CLINDAMYCIN PHOSPHATE 900 MG/50ML IV SOLN
INTRAVENOUS | Status: AC
  Filled 2017-10-19: qty 50

## 2017-10-19 MED ORDER — CLINDAMYCIN PHOSPHATE 900 MG/50ML IV SOLN
900.0000 mg | INTRAVENOUS | Status: AC
  Administered 2017-10-19: 900 mg via INTRAVENOUS

## 2017-10-19 MED ORDER — ONDANSETRON HCL 4 MG/2ML IJ SOLN
INTRAMUSCULAR | Status: DC | PRN
  Administered 2017-10-19: 4 mg via INTRAVENOUS

## 2017-10-19 MED ORDER — LIDOCAINE HCL (CARDIAC) 20 MG/ML IV SOLN
INTRAVENOUS | Status: DC | PRN
  Administered 2017-10-19: 80 mg via INTRAVENOUS

## 2017-10-19 MED ORDER — BUPIVACAINE-EPINEPHRINE (PF) 0.25% -1:200000 IJ SOLN
INTRAMUSCULAR | Status: AC
  Filled 2017-10-19: qty 30

## 2017-10-19 MED ORDER — DEXAMETHASONE SODIUM PHOSPHATE 10 MG/ML IJ SOLN
INTRAMUSCULAR | Status: DC | PRN
  Administered 2017-10-19: 10 mg via INTRAVENOUS

## 2017-10-19 MED ORDER — FENTANYL CITRATE (PF) 100 MCG/2ML IJ SOLN
50.0000 ug | INTRAMUSCULAR | Status: DC | PRN
  Administered 2017-10-19: 50 ug via INTRAVENOUS

## 2017-10-19 MED ORDER — ONDANSETRON HCL 4 MG/2ML IJ SOLN
INTRAMUSCULAR | Status: AC
  Filled 2017-10-19: qty 2

## 2017-10-19 MED ORDER — SCOPOLAMINE 1 MG/3DAYS TD PT72: 1.0000 | MEDICATED_PATCH | Freq: Once | TRANSDERMAL | Status: DC | PRN

## 2017-10-19 MED ORDER — OXYCODONE HCL 5 MG PO TABS
ORAL_TABLET | ORAL | Status: AC
  Filled 2017-10-19: qty 1

## 2017-10-19 MED ORDER — PROPOFOL 10 MG/ML IV BOLUS
INTRAVENOUS | Status: AC
  Filled 2017-10-19: qty 40

## 2017-10-19 MED ORDER — IBUPROFEN 800 MG PO TABS: 800.0000 mg | ORAL_TABLET | Freq: Three times a day (TID) | ORAL | 0 refills | Status: DC | PRN

## 2017-10-19 MED ORDER — CHLORHEXIDINE GLUCONATE CLOTH 2 % EX PADS: 6.0000 | MEDICATED_PAD | Freq: Once | CUTANEOUS | Status: DC

## 2017-10-19 MED ORDER — EPHEDRINE SULFATE 50 MG/ML IJ SOLN
INTRAMUSCULAR | Status: DC | PRN
  Administered 2017-10-19 (×2): 10 mg via INTRAVENOUS

## 2017-10-19 MED ORDER — METOCLOPRAMIDE HCL 5 MG/ML IJ SOLN: 10.0000 mg | Freq: Once | INTRAMUSCULAR | Status: DC | PRN

## 2017-10-19 MED ORDER — ACETAMINOPHEN 500 MG PO TABS
ORAL_TABLET | ORAL | Status: AC
  Filled 2017-10-19: qty 2

## 2017-10-19 MED ORDER — FENTANYL CITRATE (PF) 100 MCG/2ML IJ SOLN: 25.0000 ug | INTRAMUSCULAR | Status: DC | PRN

## 2017-10-19 MED ORDER — MEPERIDINE HCL 25 MG/ML IJ SOLN: 6.2500 mg | INTRAMUSCULAR | Status: DC | PRN

## 2017-10-19 MED ORDER — OXYCODONE HCL 5 MG PO TABS
5.0000 mg | ORAL_TABLET | Freq: Once | ORAL | Status: AC | PRN
  Administered 2017-10-19: 5 mg via ORAL

## 2017-10-19 MED ORDER — MIDAZOLAM HCL 2 MG/2ML IJ SOLN
1.0000 mg | INTRAMUSCULAR | Status: DC | PRN
  Administered 2017-10-19: 2 mg via INTRAVENOUS

## 2017-10-19 MED ORDER — DEXAMETHASONE SODIUM PHOSPHATE 10 MG/ML IJ SOLN
INTRAMUSCULAR | Status: AC
  Filled 2017-10-19: qty 1

## 2017-10-19 MED ORDER — LACTATED RINGERS IV SOLN
INTRAVENOUS | Status: DC
  Administered 2017-10-19: 08:00:00 via INTRAVENOUS

## 2017-10-19 MED ORDER — BUPIVACAINE-EPINEPHRINE 0.25% -1:200000 IJ SOLN
INTRAMUSCULAR | Status: DC | PRN
  Administered 2017-10-19: 10 mL

## 2017-10-19 MED ORDER — FENTANYL CITRATE (PF) 100 MCG/2ML IJ SOLN
INTRAMUSCULAR | Status: AC
  Filled 2017-10-19: qty 2

## 2017-10-19 MED ORDER — ACETAMINOPHEN 500 MG PO TABS
1000.0000 mg | ORAL_TABLET | ORAL | Status: AC
  Administered 2017-10-19: 1000 mg via ORAL

## 2017-10-19 MED ORDER — LACTATED RINGERS IV SOLN: INTRAVENOUS | Status: DC

## 2017-10-19 MED ORDER — PROPOFOL 10 MG/ML IV BOLUS
INTRAVENOUS | Status: DC | PRN
  Administered 2017-10-19: 200 mg via INTRAVENOUS

## 2017-10-19 MED ORDER — LIDOCAINE HCL (CARDIAC) 20 MG/ML IV SOLN
INTRAVENOUS | Status: AC
  Filled 2017-10-19: qty 5

## 2017-10-19 MED ORDER — MIDAZOLAM HCL 2 MG/2ML IJ SOLN
INTRAMUSCULAR | Status: AC
Start: 2017-10-19 — End: ?
  Filled 2017-10-19: qty 2

## 2017-10-19 MED FILL — IBUPROFEN 800 MG TAB: 800 | 10 days supply | Qty: 30 | Fill #0

## 2017-10-19 SURGICAL SUPPLY — 41 items
ADH SKN CLS APL DERMABOND .7 (GAUZE/BANDAGES/DRESSINGS) ×1
APPLIER CLIP 9.375 MED OPEN (MISCELLANEOUS) ×3
APR CLP MED 9.3 20 MLT OPN (MISCELLANEOUS) ×1
BLADE SURG 15 STRL LF DISP TIS (BLADE) ×1 IMPLANT
BLADE SURG 15 STRL SS (BLADE) ×3
CANISTER SUCT 1200ML W/VALVE (MISCELLANEOUS) ×3 IMPLANT
CHLORAPREP W/TINT 26ML (MISCELLANEOUS) ×3 IMPLANT
CLIP APPLIE 9.375 MED OPEN (MISCELLANEOUS) IMPLANT
COVER BACK TABLE 60X90IN (DRAPES) ×3 IMPLANT
COVER MAYO STAND STRL (DRAPES) ×3 IMPLANT
DECANTER SPIKE VIAL GLASS SM (MISCELLANEOUS) ×3 IMPLANT
DERMABOND ADVANCED (GAUZE/BANDAGES/DRESSINGS) ×2
DERMABOND ADVANCED .7 DNX12 (GAUZE/BANDAGES/DRESSINGS) ×1 IMPLANT
DRAPE LAPAROTOMY 100X72 PEDS (DRAPES) ×3 IMPLANT
DRAPE UTILITY XL STRL (DRAPES) ×3 IMPLANT
ELECT COATED BLADE 2.86 ST (ELECTRODE) ×3 IMPLANT
ELECT REM PT RETURN 9FT ADLT (ELECTROSURGICAL) ×3
ELECTRODE REM PT RTRN 9FT ADLT (ELECTROSURGICAL) ×1 IMPLANT
GLOVE BIO SURGEON STRL SZ 6.5 (GLOVE) ×1 IMPLANT
GLOVE BIO SURGEONS STRL SZ 6.5 (GLOVE) ×1
GLOVE BIOGEL PI IND STRL 8 (GLOVE) ×1 IMPLANT
GLOVE BIOGEL PI INDICATOR 8 (GLOVE) ×2
GLOVE ECLIPSE 8.0 STRL XLNG CF (GLOVE) ×3 IMPLANT
GOWN STRL REUS W/ TWL LRG LVL3 (GOWN DISPOSABLE) ×2 IMPLANT
GOWN STRL REUS W/TWL LRG LVL3 (GOWN DISPOSABLE) ×6
HEMOSTAT ARISTA ABSORB 3G PWDR (MISCELLANEOUS) IMPLANT
KIT MARKER MARGIN INK (KITS) ×2 IMPLANT
NDL HYPO 25X1 1.5 SAFETY (NEEDLE) ×1 IMPLANT
NEEDLE HYPO 25X1 1.5 SAFETY (NEEDLE) ×3 IMPLANT
NS IRRIG 1000ML POUR BTL (IV SOLUTION) ×3 IMPLANT
PACK BASIN DAY SURGERY FS (CUSTOM PROCEDURE TRAY) ×3 IMPLANT
PENCIL BUTTON HOLSTER BLD 10FT (ELECTRODE) ×3 IMPLANT
SLEEVE SCD COMPRESS KNEE MED (MISCELLANEOUS) ×3 IMPLANT
SPONGE LAP 4X18 X RAY DECT (DISPOSABLE) ×3 IMPLANT
SUT MON AB 4-0 PC3 18 (SUTURE) ×3 IMPLANT
SUT VICRYL 3-0 CR8 SH (SUTURE) ×3 IMPLANT
SYR CONTROL 10ML LL (SYRINGE) ×3 IMPLANT
TOWEL OR 17X24 6PK STRL BLUE (TOWEL DISPOSABLE) ×6 IMPLANT
TUBE CONNECTING 20'X1/4 (TUBING) ×1
TUBE CONNECTING 20X1/4 (TUBING) ×2 IMPLANT
YANKAUER SUCT BULB TIP NO VENT (SUCTIONS) ×3 IMPLANT

## 2017-10-19 NOTE — Anesthesia Preprocedure Evaluation (Signed)
Anesthesia Evaluation  Patient identified by MRN, date of birth, ID band Patient awake    Reviewed: Allergy & Precautions, H&P , NPO status , Patient's Chart, lab work & pertinent test results  Airway Mallampati: II  TM Distance: >3 FB Neck ROM: Full    Dental no notable dental hx. (+) Teeth Intact, Dental Advisory Given   Pulmonary neg pulmonary ROS, former smoker,    Pulmonary exam normal breath sounds clear to auscultation       Cardiovascular Exercise Tolerance: Good hypertension, Pt. on medications  Rhythm:Regular Rate:Normal     Neuro/Psych negative neurological ROS  negative psych ROS   GI/Hepatic negative GI ROS, Neg liver ROS,   Endo/Other  negative endocrine ROS  Renal/GU negative Renal ROS  negative genitourinary   Musculoskeletal   Abdominal   Peds  Hematology negative hematology ROS (+)   Anesthesia Other Findings   Reproductive/Obstetrics negative OB ROS                             Anesthesia Physical  Anesthesia Plan  ASA: II  Anesthesia Plan: General   Post-op Pain Management:    Induction: Intravenous  PONV Risk Score and Plan: 4 or greater and Ondansetron, Dexamethasone and Midazolam  Airway Management Planned: LMA  Additional Equipment:   Intra-op Plan:   Post-operative Plan:   Informed Consent: I have reviewed the patients History and Physical, chart, labs and discussed the procedure including the risks, benefits and alternatives for the proposed anesthesia with the patient or authorized representative who has indicated his/her understanding and acceptance.   Dental advisory given  Plan Discussed with: CRNA  Anesthesia Plan Comments:         Anesthesia Quick Evaluation

## 2017-10-19 NOTE — Anesthesia Procedure Notes (Signed)
Procedure Name: LMA Insertion Performed by: Arran Fessel M, CRNA Pre-anesthesia Checklist: Patient identified, Emergency Drugs available, Suction available, Patient being monitored and Timeout performed Patient Re-evaluated:Patient Re-evaluated prior to induction Oxygen Delivery Method: Circle system utilized Preoxygenation: Pre-oxygenation with 100% oxygen Induction Type: IV induction LMA: LMA inserted LMA Size: 4.0 Tube type: Oral Placement Confirmation: positive ETCO2,  CO2 detector and breath sounds checked- equal and bilateral Tube secured with: Tape Dental Injury: Teeth and Oropharynx as per pre-operative assessment        

## 2017-10-19 NOTE — Discharge Instructions (Signed)
Oxycodone given at 0905.  Sara Freeman Office Phone Number [phone]  BREAST BIOPSY/ LUMPECTOMY : POST OP INSTRUCTIONS  Always review your discharge instruction sheet given to you by the facility where your surgery was performed.  IF YOU HAVE DISABILITY OR FAMILY LEAVE FORMS, YOU MUST BRING THEM TO THE OFFICE FOR PROCESSING.  DO NOT GIVE THEM TO YOUR DOCTOR.  1. A prescription for pain medication may be given to you upon discharge.  Take your pain medication as prescribed, if needed.  If narcotic pain medicine is not needed, then you may take acetaminophen (Tylenol) or ibuprofen (Advil) as needed. **1,000mg  of tylenol given at 6:51am** 2. Take your usually prescribed medications unless otherwise directed 3. If you need a refill on your pain medication, please contact your pharmacy.  They will contact our office to request authorization.  Prescriptions will not be filled after 5pm or on week-ends. 4. You should eat very light the first 24 hours after surgery, such as soup, crackers, pudding, etc.  Resume your normal diet the day after surgery. 5. Most patients will experience some swelling and bruising in the breast.  Ice packs and a good support bra will help.  Swelling and bruising can take several days to resolve.  6. It is common to experience some constipation if taking pain medication after surgery.  Increasing fluid intake and taking a stool softener will usually help or prevent this problem from occurring.  A mild laxative (Milk of Magnesia or Miralax) should be taken according to package directions if there are no bowel movements after 48 hours. 7. Unless discharge instructions indicate otherwise, you may remove your bandages 24-48 hours after surgery, and you may shower at that time.  You may have steri-strips (small skin tapes) in place directly over the incision.  These strips should be left on the skin for 7-10 days.  If your surgeon used skin glue on the incision, you  may shower in 24 hours.  The glue will flake off over the next 2-3 weeks.  Any sutures or staples will be removed at the office during your follow-up visit. 8. ACTIVITIES:  You may resume regular daily activities (gradually increasing) beginning the next day.  Wearing a good support bra or sports bra minimizes pain and swelling.  You may have sexual intercourse when it is comfortable. a. You may drive when you no longer are taking prescription pain medication, you can comfortably wear a seatbelt, and you can safely maneuver your car and apply brakes. b. RETURN TO WORK:  ______________________________________________________________________________________ 9. You should see your doctor in the office for a follow-up appointment approximately two weeks after your surgery.  Your doctors nurse will typically make your follow-up appointment when she calls you with your pathology report.  Expect your pathology report 2-3 business days after your surgery.  You may call to check if you do not hear from [place] after three days. 10. OTHER INSTRUCTIONS: _______________________________________________________________________________________________ _____________________________________________________________________________________________________________________________________ _____________________________________________________________________________________________________________________________________ _____________________________________________________________________________________________________________________________________  WHEN TO CALL YOUR DOCTOR: 1. Fever over 101.0 2. Nausea and/or vomiting. 3. Extreme swelling or bruising. 4. Continued bleeding from incision. 5. Increased pain, redness, or drainage from the incision.  The clinic staff is available to answer your questions during regular business hours.  Please dont hesitate to call and ask to speak to one of the nurses for clinical  concerns.  If you have a medical emergency, go to the nearest emergency room or call 911.  A surgeon from [facility] is always on call  at the hospital.  For further questions, please visit [email or web address]    Post Anesthesia Home Care Instructions  Activity: Get plenty of rest for the remainder of the day. A responsible individual must stay with you for 24 hours following the procedure.  For the next 24 hours, DO NOT: -Drive a car -Paediatric nurse -Drink alcoholic beverages -Take any medication unless instructed by your physician -Make any legal decisions or sign important papers.  Meals: Start with liquid foods such as gelatin or soup. Progress to regular foods as tolerated. Avoid greasy, spicy, heavy foods. If nausea and/or vomiting occur, drink only clear liquids until the nausea and/or vomiting subsides. Call your physician if vomiting continues.  Special Instructions/Symptoms: Your throat may feel dry or sore from the anesthesia or the breathing tube placed in your throat during surgery. If this causes discomfort, gargle with warm salt water. The discomfort should disappear within 24 hours.  If you had a scopolamine patch placed behind your ear for the management of post- operative nausea and/or vomiting:  1. The medication in the patch is effective for 72 hours, after which it should be removed.  Wrap patch in a tissue and discard in the trash. Wash hands thoroughly with soap and water. 2. You may remove the patch earlier than 72 hours if you experience unpleasant side effects which may include dry mouth, dizziness or visual disturbances. 3. Avoid touching the patch. Wash your hands with soap and water after contact with the patch.

## 2017-10-19 NOTE — Interval H&P Note (Signed)
History and Physical Interval Note:  10/19/2017 7:12 AM  Sara Freeman  has presented today for surgery, with the diagnosis of left breast cancer  The various methods of treatment have been discussed with the patient and family. After consideration of risks, benefits and other options for treatment, the patient has consented to  Procedure(s): LEFT BREAST RE EXCISION LUMPECTOMY (Left) as a surgical intervention .  The patient's history has been reviewed, patient examined, no change in status, stable for surgery.  I have reviewed the patient's chart and labs.  Questions were answered to the patient's satisfaction.     [name]

## 2017-10-19 NOTE — Anesthesia Postprocedure Evaluation (Signed)
Anesthesia Post Note  Patient: Sara Freeman  Procedure(s) Performed: LEFT BREAST RE EXCISION LUMPECTOMY (Left Breast)     Patient location during evaluation: PACU Anesthesia Type: General Level of consciousness: awake and alert Pain management: pain level controlled Vital Signs Assessment: post-procedure vital signs reviewed and stable Respiratory status: spontaneous breathing, nonlabored ventilation, respiratory function stable and patient connected to nasal cannula oxygen Cardiovascular status: blood pressure returned to baseline and stable Postop Assessment: no apparent nausea or vomiting Anesthetic complications: no    Last Vitals:  Vitals:   10/19/17 0845 10/19/17 0919  BP: 116/66 114/62  Pulse: 99 99  Resp: 14 16  Temp:  36.6 C  SpO2: 99% 100%    Last Pain:  Vitals:   10/19/17 0919  TempSrc:   PainSc: 2                  Montez Hageman

## 2017-10-19 NOTE — Transfer of Care (Signed)
Immediate Anesthesia Transfer of Care Note  Patient: Sara Freeman  Procedure(s) Performed: LEFT BREAST RE EXCISION LUMPECTOMY (Left Breast)  Patient Location: PACU  Anesthesia Type:General  Level of Consciousness: awake, alert  and oriented  Airway & Oxygen Therapy: Patient Spontanous Breathing and Patient connected to face mask oxygen  Post-op Assessment: Report given to RN and Post -op Vital signs reviewed and stable  Post vital signs: Reviewed and stable  Last Vitals:  Vitals Value Taken Time  BP 112/68 10/19/2017  8:15 AM  Temp 36.5 C 10/19/2017  8:15 AM  Pulse 86 10/19/2017  8:19 AM  Resp 10 10/19/2017  8:19 AM  SpO2 100 % 10/19/2017  8:19 AM  Vitals shown include unvalidated device data.  Last Pain:  Vitals:   10/19/17 0641  TempSrc: Oral  PainSc: 0-No pain      Patients Stated Pain Goal: 0 (61/53/79 4327)  Complications: No apparent anesthesia complications

## 2017-10-19 NOTE — Op Note (Signed)
Left Breast Re-excison Lumpectomy Procedure Note  Indications:  This patient returns following an initial lumpectomy for DCIS.  Analysis of the pathology specimen revealed microscopic involvement of the medial, lateral, posterior, anterior, inferior and superior margins.  The patient now returns for re-excision. The procedure has been discussed with the patient. Alternatives to surgery have been discussed with the patient.  Risks of surgery include bleeding,  Infection,  Seroma formation, death,  and the need for further surgery.   The patient understands and wishes to proceed. Pre-operative Diagnosis: left DCIS   Post-operative Diagnosis: left DCIS  Surgeon: Joyice Faster Jinnie Onley   Assistants: none   Anesthesia: General LMA anesthesia and Local anesthesia 0.25.% bupivacaine, with epinephrine  ASA Class: 2  Procedure Details  The patient was seen in the Holding Room. The risks, benefits, complications, treatment options, and expected outcomes were discussed with the patient. The possibilities of reaction to medication, pulmonary aspiration, bleeding, infection, the need for additional procedures, failure to diagnose a condition, and creating a complication requiring transfusion or operation were discussed with the patient. The patient concurred with the proposed plan, giving informed consent.  The site of surgery properly noted/marked. The patient was taken to Operating Room # 8, identified as Sara Freeman and the procedure verified as Breast Re-excision Lumpectomy. A Time Out was held and the above information confirmed.  the patient was placed supine.  The breast was prepped and draped in standard fashion. Marcaine 0.25% with epinephrine was used to anesthetize the skin around the previous lumpectomy incision.  The incision was opened.  A  seroma was evacuated.  Additional local anesthesia was delivered medial, lateral, posterior, anterior, inferior and superiorly within the lumpectomy cavity.  A  full thickness re-excision was performed.   The new margins were inked and the specimen was submitted to pathology.  Hemostasis was achieved with cautery.  Closure was performed in 2 layers with a 3-0 Vicryl  And 4 O monocryl subcuticular closure.    Dermabond was applied.  At the end of the operation, all sponge, instrument and needle counts were correct. The patient was awoke extubated taken to recovery in satisfactory condition.   Findings: grossly clear surgical margins  Estimated Blood Loss:  less than 50 mL         Drains: none         Total IV Fluids: per record          Specimens: see above          Implants: none         Complications:  None; patient tolerated the procedure well.         Disposition: PACU - hemodynamically stable.         Condition: stable  Attending Attestation: I performed the procedure.

## 2017-10-20 ENCOUNTER — Encounter (HOSPITAL_BASED_OUTPATIENT_CLINIC_OR_DEPARTMENT_OTHER): Payer: Self-pay | Admitting: Surgery

## 2017-10-22 ENCOUNTER — Other Ambulatory Visit: Payer: Self-pay

## 2017-10-22 ENCOUNTER — Encounter: Payer: Self-pay | Admitting: Family Medicine

## 2017-10-22 ENCOUNTER — Ambulatory Visit: Payer: Self-pay | Admitting: Family Medicine

## 2017-10-22 ENCOUNTER — Ambulatory Visit (INDEPENDENT_AMBULATORY_CARE_PROVIDER_SITE_OTHER): Payer: No Typology Code available for payment source | Admitting: Family Medicine

## 2017-10-22 ENCOUNTER — Ambulatory Visit: Payer: No Typology Code available for payment source | Admitting: Family Medicine

## 2017-10-22 VITALS — BP 106/68 | HR 81 | Temp 98.7°F | Ht 63.0 in | Wt 173.1 lb

## 2017-10-22 DIAGNOSIS — D0512 Intraductal carcinoma in situ of left breast: Secondary | ICD-10-CM | POA: Diagnosis not present

## 2017-10-22 DIAGNOSIS — I10 Essential (primary) hypertension: Secondary | ICD-10-CM | POA: Diagnosis not present

## 2017-10-22 MED ORDER — LISINOPRIL 5 MG PO TABS: 5.0000 mg | ORAL_TABLET | Freq: Every day | ORAL | 3 refills | Status: DC

## 2017-10-22 MED ORDER — CHLORTHALIDONE 25 MG PO TABS: 25.0000 mg | ORAL_TABLET | Freq: Every day | ORAL | 3 refills | Status: DC

## 2017-10-22 NOTE — Progress Notes (Signed)
Chief Complaint  Patient presents with  . Hypertension    follow up   Feels great BP well controlled Recent Dx of Breast Ca with surgery and planned radiation, also estrogen treatment. Is confident and positive in her outlook towards recovery. Health maintenance up to date   Patient Active Problem List   Diagnosis Date Noted  . 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 Genetic testing 09/10/2017  . Breast cancer ([facility]) 09/09/2017  . Family history of breast cancer   . Family history of heart disease in female family member before age 47 05/28/2017  . Endometriosis 11/23/2012  . HTN (hypertension) 11/23/2012    Outpatient Encounter Medications as of 10/22/2017  Medication Sig  . acetaminophen (TYLENOL) 500 MG tablet Take 500-1,000 mg by mouth every 6 (six) hours as needed (for pain.).  Marland Kitchen chlorthalidone (HYGROTON) 25 MG tablet Take 1 tablet (25 mg total) by mouth daily.  . fluticasone (FLONASE) 50 MCG/ACT nasal spray Place 2 sprays into both nostrils daily.  Marland Kitchen ibuprofen (ADVIL,MOTRIN) 800 MG tablet Take 1 tablet (800 mg total) by mouth every 8 (eight) hours as needed.  Marland Kitchen lisinopril (PRINIVIL,ZESTRIL) 5 MG tablet Take 1 tablet (5 mg total) by mouth daily.  . ondansetron (ZOFRAN) 8 MG tablet Take 8 mg by mouth as directed.   No facility-administered encounter medications on file as of 10/22/2017.     Allergies  Allergen Reactions  . Penicillins Other (See Comments)    UNSPECIFIED REACTION OF CHILDHOOD Has patient had a PCN reaction causing immediate rash, facial/tongue/throat swelling, SOB or lightheadedness with hypotension: Unknown Has patient had a PCN reaction causing severe rash involving mucus membranes or skin necrosis: Unknown Has patient had a PCN reaction that required hospitalization: Unknown Has patient had a PCN reaction occurring within the last 10 years: Unknown If all of the above answers are "NO", then may proceed with Cephalosporin use.   .  Azithromycin Itching    Itching of skin without rash   Review of Systems  Constitutional: Negative for activity change, appetite change and unexpected weight change.  HENT: Negative for congestion, dental problem, postnasal drip and rhinorrhea.   Eyes: Negative for redness and visual disturbance.  Respiratory: Negative for cough and shortness of breath.   Cardiovascular: Negative for chest pain, palpitations and leg swelling.  Gastrointestinal: Negative for abdominal pain, constipation and diarrhea.  Genitourinary: Negative for difficulty urinating, frequency and menstrual problem.  Musculoskeletal: Negative for arthralgias and back pain.  Neurological: Negative for dizziness and headaches.  Psychiatric/Behavioral: Negative for dysphoric mood and sleep disturbance. The patient is not nervous/anxious.     BP 106/68   Pulse 81   Temp 98.7 F (37.1 C) (Oral)   Ht 5\' 3"  (1.6 m)   Wt 173 lb 1.3 oz (78.5 kg)   LMP 10/12/2017   SpO2 98%   BMI 30.66 kg/m   Physical Exam  Constitutional: She is oriented to person, place, and time. She appears well-developed and well-nourished.  HENT:  Head: Normocephalic and atraumatic.  Mouth/Throat: Oropharynx is clear and moist.  Eyes: Pupils are equal, round, and reactive to light. Conjunctivae are normal.  Neck: Normal range of motion. Neck supple. No thyromegaly present.  Cardiovascular: Normal rate, regular rhythm and normal heart sounds.  Pulmonary/Chest: Effort normal and breath sounds normal. No respiratory distress.  Musculoskeletal: Normal range of motion. She exhibits no edema.  Lymphadenopathy:    She has no cervical adenopathy.  Neurological: She is alert  and oriented to person, place, and time.  Gait normal  Skin: Skin is warm and dry.  Psychiatric: She has a normal mood and affect. Her behavior is normal. Thought content normal.  Nursing note and vitals reviewed.   ASSESSMENT/PLAN:  1. Ductal carcinoma in situ (DCIS) of left  breast Under care oncology  2. Hypertension, unspecified type controlled   Patient Instructions  You are doing well  No change in medicine or therapy  See Dr Mannie Stabile in follow up  Need visit in 6 months   Raylene Everts, MD

## 2017-10-22 NOTE — Patient Instructions (Signed)
You are doing well  No change in medicine or therapy  See Dr Mannie Stabile in follow up  Need visit in 6 months

## 2017-10-27 ENCOUNTER — Encounter: Payer: Self-pay | Admitting: Obstetrics & Gynecology

## 2017-10-29 ENCOUNTER — Encounter (HOSPITAL_COMMUNITY): Payer: Self-pay | Admitting: Hematology

## 2017-10-29 ENCOUNTER — Inpatient Hospital Stay (HOSPITAL_COMMUNITY): Payer: No Typology Code available for payment source | Attending: Hematology | Admitting: Hematology

## 2017-10-29 VITALS — BP 135/71 | HR 88 | Temp 98.1°F | Resp 18 | Wt 172.2 lb

## 2017-10-29 DIAGNOSIS — Z170 Estrogen receptor positive status [ER+]: Secondary | ICD-10-CM | POA: Diagnosis not present

## 2017-10-29 DIAGNOSIS — Z79899 Other long term (current) drug therapy: Secondary | ICD-10-CM | POA: Insufficient documentation

## 2017-10-29 DIAGNOSIS — Z79811 Long term (current) use of aromatase inhibitors: Secondary | ICD-10-CM | POA: Insufficient documentation

## 2017-10-29 DIAGNOSIS — D0512 Intraductal carcinoma in situ of left breast: Secondary | ICD-10-CM | POA: Diagnosis present

## 2017-10-29 MED ORDER — TAMOXIFEN CITRATE 20 MG PO TABS: 20.0000 mg | ORAL_TABLET | Freq: Every day | ORAL | 3 refills | Status: DC

## 2017-10-29 MED FILL — TAMOXIFEN CITRATE 20 MG TAB: 20 | 90 days supply | Qty: 90 | Fill #0

## 2017-10-29 NOTE — Assessment & Plan Note (Signed)
1.  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  We discussed the side effects of tamoxifen in detail.  Tamoxifen primarily provides risk reduction in ipsilateral breast as well as contralateral breast, although there was no survival advantage.  She will come back in 46-month intervals.  We will schedule her next mammogram after July 27.  Questions were encouraged and answered to her satisfaction.  She was also told to take calcium and vitamin D supplements twice daily.

## 2017-10-29 NOTE — Progress Notes (Signed)
Patient Care Team: Caren Macadam, MD as PCP - General (Family Medicine)  DIAGNOSIS:  Encounter Diagnosis  Name Primary?  . Ductal carcinoma in situ (DCIS) of left breast Yes     CHIEF COMPLIANT: Left breast DCIS.  INTERVAL HISTORY: Sara Freeman is a 47 year old premenopausal female seen for follow-up after reexcision of lumpectomy margins.  She had a done on 10/19/2017 and is recovering well from surgery.  She denies any pain in the breast area.  She is accompanied by her husband today.  Denies any history of blood clots in the past.  REVIEW OF SYSTEMS:   Constitutional: Denies fevers, chills or abnormal weight loss Eyes: Denies blurriness of vision Ears, nose, mouth, throat, and face: Denies mucositis or sore throat Respiratory: Denies cough, dyspnea or wheezes Cardiovascular: Denies palpitation, chest discomfort Gastrointestinal:  Denies nausea, heartburn or change in bowel habits Skin: Denies abnormal skin rashes Lymphatics: Denies new lymphadenopathy or easy bruising Neurological:Denies numbness, tingling or new weaknesses Behavioral/Psych: Mood is stable, no new changes  Extremities: No lower extremity edema Breast: denies any pain or lumps or nodules in either breasts All other systems were reviewed with the patient and are negative.  I have reviewed the past medical history, past surgical history, social history and family history with the patient and they are unchanged from previous note.  ALLERGIES:  is allergic to penicillins and azithromycin.  MEDICATIONS:  Current Outpatient Medications  Medication Sig Dispense Refill  . acetaminophen (TYLENOL) 500 MG tablet Take 500-1,000 mg by mouth every 6 (six) hours as needed (for pain.).    Marland Kitchen chlorthalidone (HYGROTON) 25 MG tablet Take 1 tablet (25 mg total) by mouth daily. 90 tablet 3  . fluticasone (FLONASE) 50 MCG/ACT nasal spray Place 2 sprays into both nostrils daily. 16 g 6  . ibuprofen (ADVIL,MOTRIN) 800 MG tablet  Take 1 tablet (800 mg total) by mouth every 8 (eight) hours as needed. 30 tablet 0  . lisinopril (PRINIVIL,ZESTRIL) 5 MG tablet Take 1 tablet (5 mg total) by mouth daily. 90 tablet 3  . tamoxifen (NOLVADEX) 20 MG tablet Take 1 tablet (20 mg total) by mouth daily. 90 tablet 3   No current facility-administered medications for this visit.     PHYSICAL EXAMINATION: ECOG PERFORMANCE STATUS: 0 - Asymptomatic  Vitals:   10/29/17 1456  BP: 135/71  Pulse: 88  Resp: 18  Temp: 98.1 F (36.7 C)  SpO2: 100%   Filed Weights   10/29/17 1456  Weight: 172 lb 3.2 oz (78.1 kg)    GENERAL:alert, no distress and comfortable   LABORATORY DATA:  I have reviewed the data as listed CMP Latest Ref Rng & Units 10/15/2017 09/16/2017 05/28/2017  Glucose 65 - 99 mg/dL 86 102(H) 91  BUN 6 - 20 mg/dL 10 15 11   Creatinine 0.44 - 1.00 mg/dL 0.74 0.81 0.87  Sodium 135 - 145 mmol/L 137 140 142  Potassium 3.5 - 5.1 mmol/L 4.1 3.6 4.2  Chloride 101 - 111 mmol/L 99(L) 101 104  CO2 22 - 32 mmol/L 29 28 29   Calcium 8.9 - 10.3 mg/dL 9.5 9.4 9.6  Total Protein 6.1 - 8.1 g/dL - - 7.3  Total Bilirubin 0.2 - 1.2 mg/dL - - 0.6  AST 10 - 35 U/L - - 14  ALT 6 - 29 U/L - - 7   No results found for: NAT557   Lab Results  Component Value Date   WBC 11.4 (H) 09/16/2017   HGB 13.5 09/16/2017  HCT 39.6 09/16/2017   MCV 86.3 09/16/2017   PLT 264 09/16/2017    ASSESSMENT & PLAN:  Ductal carcinoma in situ (DCIS) of left breast 1.  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  We discussed the side effects of tamoxifen in detail.   Tamoxifen primarily provides risk reduction in ipsilateral breast as well as contralateral breast, although there was no survival advantage.  She will come back in 89-month intervals.  We will schedule her next mammogram after July 27.  Questions were encouraged and answered to her satisfaction.  She was also told to take calcium and vitamin D supplements twice daily.   Breast Cancer therapy associated bone loss: I have recommended calcium, Vitamin D and weight bearing exercises.    Orders Placed This Encounter  Procedures  . MM DIAG BREAST TOMO BILATERAL    Standing Status:   Future    Standing Expiration Date:   10/30/2018    Order Specific Question:   Reason for Exam (SYMPTOM  OR DIAGNOSIS REQUIRED)    Answer:   screening    Order Specific Question:   Is the patient pregnant?    Answer:   No    Order Specific Question:   Preferred imaging location?    Answer:   [facility]  . CBC with Differential    Standing Status:   Future    Standing Expiration Date:   10/29/2018  . Comprehensive metabolic panel    Standing Status:   Future    Standing Expiration Date:   10/29/2018   The patient has a good understanding of the overall plan. she agrees with it. she will call with any problems that may develop before the next visit here.   Derek Jack, MD 10/29/17

## 2017-10-29 NOTE — Patient Instructions (Signed)
[facility]  Discharge Instructions:  You were seen by Dr. Katragadda today.   _______________________________________________________________  Thank you for choosing [facility] to provide your oncology and hematology care.  To afford each patient quality time with our providers, please arrive at least 15 minutes before your scheduled appointment.  You need to re-schedule your appointment if you arrive 10 or more minutes late.  We strive to give you quality time with our providers, and arriving late affects you and other patients whose appointments are after yours.  Also, if you no show three or more times for appointments you may be dismissed from the clinic.  Again, thank you for choosing [facility]. Our hope is that these requests will allow you access to exceptional care and in a timely manner. _______________________________________________________________  If you have questions after your visit, please contact our office at [phone] between the hours of 8:30 a.m. and 5:00 p.m. Voicemails left after 4:30 p.m. will not be returned until the following business day. _______________________________________________________________  For prescription refill requests, have your pharmacy contact our office. _______________________________________________________________  Recommendations made by the consultant and any test results will be sent to your referring physician. _______________________________________________________________ 

## 2017-11-29 MED FILL — CHLORTHALIDONE 25 MG TAB: 25 | 90 days supply | Qty: 90 | Fill #3

## 2017-11-29 NOTE — Progress Notes (Signed)
Location of Breast Cancer: Left Breast  Histology per Pathology Report:  08/27/17 Diagnosis 1. 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 Breast, left, needle core biopsy, lower outer quadrant anterior - DUCTAL CARCINOMA IN SITU WITH CALCIFICATIONS  Receptor Status: ER(70%), PR (60%)  09/22/17 Diagnosis Breast, lumpectomy, Left w/ seeds x2 - DUCTAL CARCINOMA IN SITU (DCIS), HIGH-GRADE, WITH COMEDO NECROSIS AND MICROCALCIFICATIONS - LOBULAR CARCINOMA IN-SITU (LCIS) - NEGATIVE FOR INVASIVE CARCINOMA - THE INFERIOR MARGIN IS FOCALLY INVOLVED BY DCIS AND IN ADDITION, THE INFERIOR, ANTERIOR, POSTERIOR AND LATERAL MARGINS ARE BROADLY LESS THAN 0.1 CM FROM DCIS - BIOPSY SITE CHANGES - SEE ONCOLOGY TABLE  10/19/17 Diagnosis 1. Breast, excision, Left superior margin - BENIGN BREAST PARENCHYMA. - THERE IS NO EVIDENCE OF MALIGNANCY. - SEE COMMENT. 2. Breast, excision, Left posterior margin - BENIGN BREAST PARENCHYMA - HEALING BIOPSY SITE. - THERE IS NO EVIDENCE OF MALIGNANCY. - SEE COMMENT. 3. Breast, excision, Left inferior margin - BENIGN BREAST PARENCHYMA - HEALING BIOPSY SITE. - THERE IS NO EVIDENCE OF MALIGNANCY. - SEE COMMENT. 4. Breast, excision, Left anterior margin - BENIGN BREAST PARENCHYMA - HEALING BIOPSY SITE. - THERE IS NO EVIDENCE OF MALIGNANCY. - SEE COMMENT. 5. Breast, excision, Left medial margin - BENIGN BREAST PARENCHYMA - HEALING BIOPSY SITE. - THERE IS NO EVIDENCE OF MALIGNANCY. - SEE COMMENT. 6. Breast, excision, Left lateral margin - BENIGN BREAST PARENCHYMA - HEALING BIOPSY SITE. - THERE IS NO EVIDENCE OF MALIGNANCY.  Did patient present with symptoms or was this found on screening mammography?: It was found on a screening mammogram.   Past/Anticipated interventions by surgeon, if any: 09/22/17 Procedure: Left  breast seed localized partial mastectomy with 2 localizing seeds Surgeon: Erroll Luna MD  10/19/17 Left Breast Re-excison Lumpectomy Procedure Note   Past/Anticipated interventions by medical oncology, if any:  10/29/17 Dr. Delton Coombes ASSESSMENT & PLAN:  Ductal carcinoma in situ (DCIS) of left breast 1.  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  We discussed the side effects of tamoxifen in detail.  Tamoxifen primarily provides risk reduction in ipsilateral breast as well as contralateral breast, although there was no survival advantage.  She will come back in 35-month intervals.  We will schedule her next mammogram after July 27.  Questions were encouraged and answered to her satisfaction.  She was also told to take calcium and vitamin D supplements twice daily.   Lymphedema issues, if any: She denies. She has good arm mobility  Pain issues, if any:  She denies  SAFETY ISSUES:  Prior radiation? No  Pacemaker/ICD? No  Possible current pregnancy? No, tubal ligation  Is the patient on methotrexate? No  Current Complaints / other details:    BP 115/72   Pulse 76   Temp 98.6 F (37 C)   Resp 20   Ht 5\' 2"  (1.575 m)   Wt 171 lb 12.8 oz (77.9 kg)   SpO2 100% Comment: room air  BMI 31.42 kg/m    Wt Readings from Last 3 Encounters:  12/02/17 171 lb 12.8 oz (77.9  kg)  12/02/17 171 lb 12.8 oz (77.9 kg)  10/29/17 172 lb 3.2 oz (78.1 kg)      Nawaal Alling, Stephani Police, RN 11/29/2017,9:17 AM

## 2017-12-02 ENCOUNTER — Other Ambulatory Visit: Payer: Self-pay

## 2017-12-02 ENCOUNTER — Ambulatory Visit
Admission: RE | Admit: 2017-12-02 | Discharge: 2017-12-02 | Disposition: A | Discharge status: Home / Self Care | Payer: No Typology Code available for payment source | Source: Ambulatory Visit | Attending: Radiation Oncology | Admitting: Radiation Oncology

## 2017-12-02 ENCOUNTER — Encounter: Payer: Self-pay | Admitting: Radiation Oncology

## 2017-12-02 VITALS — BP 115/72 | HR 76 | Temp 98.6°F | Resp 20 | Ht 62.0 in | Wt 171.8 lb

## 2017-12-02 DIAGNOSIS — Z87891 Personal history of nicotine dependence: Secondary | ICD-10-CM | POA: Insufficient documentation

## 2017-12-02 DIAGNOSIS — C50512 Malignant neoplasm of lower-outer quadrant of left female breast: Secondary | ICD-10-CM

## 2017-12-02 DIAGNOSIS — Z803 Family history of malignant neoplasm of breast: Secondary | ICD-10-CM | POA: Insufficient documentation

## 2017-12-02 DIAGNOSIS — D0512 Intraductal carcinoma in situ of left breast: Secondary | ICD-10-CM | POA: Insufficient documentation

## 2017-12-02 DIAGNOSIS — Z79810 Long term (current) use of selective estrogen receptor modulators (SERMs): Secondary | ICD-10-CM | POA: Diagnosis not present

## 2017-12-02 DIAGNOSIS — Z170 Estrogen receptor positive status [ER+]: Secondary | ICD-10-CM | POA: Diagnosis not present

## 2017-12-02 DIAGNOSIS — Z510 Encounter for antineoplastic radiation therapy: Secondary | ICD-10-CM | POA: Insufficient documentation

## 2017-12-02 NOTE — Progress Notes (Signed)
Radiation Oncology         [phone] ________________________________  Name: Sara Freeman        MRN: [id number]  Date of Service: 12/02/2017 DOB: 12-Jun-1971  JJ:HERDEY, Apolonio Schneiders, MD  Erroll Luna, MD     REFERRING PHYSICIAN: Erroll Luna, MD   DIAGNOSIS: The encounter diagnosis was Ductal carcinoma in situ (DCIS) of left breast.   HISTORY OF PRESENT ILLNESS: Sara Freeman is a 47 y.o. female who was originally seen at the request of Dr. Brantley Stage for a new diagnosis of left breast cancer. The patient presented for screening mammogram which revealed an area of calcifications about 6 months ago and proceeded with repeat diagnostic mammogram which revealed calcifications measuring up to 5.7 cm. She underwent stereo guided biopsy of the lower outer quadrant at an anterior site as well as posterior site. She was found on both biopsies to have DCIS. No grade was given, but her markers were ER/PR positive.  She proceeded with surgical resection on 09/22/2017 where she underwent lumpectomy of the left breast.  Final pathology revealed high-grade DCIS with microcalcifications and features of LCIS.  Inferior margin was positive, and her inferior, anterior, posterior and lateral margins were also less than 1 mm away from the inked edge.  She underwent reresection on 10/19/2017 and all of the additional margins were negative for disease.  She comes today for further discussion of adjuvant therapy.   PREVIOUS RADIATION THERAPY: No   PAST MEDICAL HISTORY:  Past Medical History:  Diagnosis Date  . Allergy   . Breast cancer ([place])   . Breast cancer ([place]) 09/09/2017  . Endometriosis   . Family history of breast cancer   . Hypertension        PAST SURGICAL HISTORY: Past Surgical History:  Procedure Laterality Date  . BREAST BIOPSY Left 2017   benign  . BREAST BIOPSY Left 08/27/2017   Needle core biopsy  . BREAST LUMPECTOMY Left 10/19/2017   Procedure: LEFT BREAST RE EXCISION LUMPECTOMY;   Surgeon: Erroll Luna, MD;  Location: [facility];  Service: General;  Laterality: Left;  . BREAST LUMPECTOMY WITH RADIOACTIVE SEED LOCALIZATION Left 09/22/2017   Procedure: BREAST LUMPECTOMY  WITH RADIOACTIVE SEED LOCALIZATION X 2;  Surgeon: Erroll Luna, MD;  Location: [facility];  Service: General;  Laterality: Left;  . TUBAL LIGATION  1999   [facility]     FAMILY HISTORY:  Family History  Problem Relation Age of Onset  . CAD Other   . Heart disease Other   . Stroke Paternal Grandfather   . Stroke Paternal Grandmother   . Aneurysm Maternal Grandfather   . Heart attack Father   . Hypertension Father   . COPD Father   . Heart disease Father        8 died  . Thyroid disease Mother   . Hypertension Mother   . Hypertension Brother   . Stroke Paternal Uncle   . Breast cancer Other 60       metastatic     SOCIAL HISTORY:  reports that she quit smoking about 11 years ago. Her smoking use included cigarettes. She started smoking about 31 years ago. She smoked 1.00 pack per day. She has never used smokeless tobacco. She reports that she does not drink alcohol or use drugs. The patient is married and lives in [place]. She works in [employer], and works in a Teacher, music.    ALLERGIES: Penicillins and Azithromycin   MEDICATIONS:  Current Outpatient Medications  Medication Sig Dispense Refill  . acetaminophen (TYLENOL) 500 MG tablet Take 500-1,000 mg by mouth every 6 (six) hours as needed (for pain.).    Marland Kitchen calcium-vitamin D 250-100 MG-UNIT tablet Take 1 tablet by mouth 2 (two) times daily. Taking calcium 600 mg plus vitamin D twice daily    . chlorthalidone (HYGROTON) 25 MG tablet Take 1 tablet (25 mg total) by mouth daily. 90 tablet 3  . fluticasone (FLONASE) 50 MCG/ACT nasal spray Place 2 sprays into both nostrils daily. 16 g 6  . ibuprofen (ADVIL,MOTRIN) 800 MG tablet Take 1 tablet (800 mg total) by mouth every 8 (eight) hours as needed. 30 tablet 0  . lisinopril  (PRINIVIL,ZESTRIL) 5 MG tablet Take 1 tablet (5 mg total) by mouth daily. 90 tablet 3  . tamoxifen (NOLVADEX) 20 MG tablet Take 1 tablet (20 mg total) by mouth daily. 90 tablet 3   No current facility-administered medications for this encounter.      REVIEW OF SYSTEMS: On review of systems, the patient reports that she is doing well overall. She denies any chest pain, shortness of breath, cough, fevers, chills, night sweats, unintended weight changes. She denies any bowel or bladder disturbances, and denies abdominal pain, nausea or vomiting. She denies any new musculoskeletal or joint aches or pains. A complete review of systems is obtained and is otherwise negative.     PHYSICAL EXAM:  Wt Readings from Last 3 Encounters:  12/02/17 171 lb 12.8 oz (77.9 kg)  12/02/17 171 lb 12.8 oz (77.9 kg)  10/29/17 172 lb 3.2 oz (78.1 kg)   Temp Readings from Last 3 Encounters:  12/02/17 98.6 F (37 C)  12/02/17 98.6 F (37 C) (Oral)  10/29/17 98.1 F (36.7 C) (Oral)   BP Readings from Last 3 Encounters:  12/02/17 115/72  12/02/17 115/72  10/29/17 135/71   Pulse Readings from Last 3 Encounters:  12/02/17 76  12/02/17 76  10/29/17 88     In general this is a well appearing caucasian female in no acute distress. She is alert and oriented x4 and appropriate throughout the examination. HEENT reveals that the patient is normocephalic, atraumatic. EOMs are intact. PERRLA. Skin is intact without any evidence of gross lesions.  Cardiopulmonary assessment is negative for acute distress and she exhibits normal effort. Breast exam reveals a  well healed lumpectomy site of the right breast without erythema or edema of the chest wall or LUE.    ECOG = 0  0 - Asymptomatic (Fully active, able to carry on all predisease activities without restriction)  1 - Symptomatic but completely ambulatory (Restricted in physically strenuous activity but ambulatory and able to carry out work of a light or  sedentary nature. For example, light housework, office work)  2 - Symptomatic, <50% in bed during the day (Ambulatory and capable of all self care but unable to carry out any work activities. Up and about more than 50% of waking hours)  3 - Symptomatic, >50% in bed, but not bedbound (Capable of only limited self-care, confined to bed or chair 50% or more of waking hours)  4 - Bedbound (Completely disabled. Cannot carry on any self-care. Totally confined to bed or chair)  5 - Death   Eustace Pen MM, Creech RH, Tormey DC, et al. [phone]). "Toxicity and response criteria of the [facility]". [place] Oncol. 5 (6): 649-55    LABORATORY DATA:  Lab Results  Component Value Date   WBC 11.4 (H) 09/16/2017  HGB 13.5 09/16/2017   HCT 39.6 09/16/2017   MCV 86.3 09/16/2017   PLT 264 09/16/2017   Lab Results  Component Value Date   NA 137 10/15/2017   K 4.1 10/15/2017   CL 99 (L) 10/15/2017   CO2 29 10/15/2017   Lab Results  Component Value Date   ALT 7 05/28/2017   AST 14 05/28/2017   BILITOT 0.6 05/28/2017      RADIOGRAPHY: No results found.     IMPRESSION/PLAN: 1. ER/PR positive DCIS of the left breast. Dr. Lisbeth Renshaw discusses the final pathology results and discuss is the rationale for adjuvant radiotherapy to reduce risk of local recurrence.  She is healing well, and is ready to proceed with simulation which is scheduled for today.  She will continue to follow-up with medical oncology as her treatment will be followed by antiestrogen therapy. We discussed the risks, benefits, short, and long term effects of radiotherapy, and the patient is interested in proceeding. Dr. Lisbeth Renshaw discusses the delivery and logistics of radiotherapy and anticipates a course of 6 1/2 weeks with deep inspiration breath hold technique. Written consent is obtained and placed in the chart, a copy was provided to the patient..  2. Possible genetic predisposition to malignancy. The patient has  been offered genetic counseling but has declined previously. She is not interested when discussing this today.  In a visit lasting 25 minutes, greater than 50% of the time was spent face to face discussing her case, and coordinating the patient's care.      The above documentation reflects my direct findings during this shared patient visit. Please see the separate note by Dr. Lisbeth Renshaw on this date for the remainder of the patient's plan of care.    Carola Rhine, PAC

## 2017-12-02 NOTE — Progress Notes (Signed)
  Radiation Oncology         [phone] ________________________________  Name: Sara Freeman MRN: [id number]  Date: 12/02/2017  DOB: 07/03/1971  DIAGNOSIS:     ICD-10-CM   1. Ductal carcinoma in situ (DCIS) of left breast D05.12      SIMULATION AND TREATMENT PLANNING NOTE  The patient presented for simulation prior to beginning her course of radiation treatment for her diagnosis of left-sided breast cancer. The patient was placed in a supine position on a breast board. A customized vac-lock bag was constructed and this complex treatment device will be used on a daily basis during her treatment. In this fashion, a CT scan was obtained through the chest area and an isocenter was placed near the chest wall within the breast.  The patient will be planned to receive a course of radiation initially to a dose of 50.4 Gy. This will consist of a whole breast radiotherapy technique. To accomplish this, 2 customized blocks have been designed which will correspond to medial and lateral whole breast tangent fields. This treatment will be accomplished at 1.8 Gy per fraction. A forward planning technique will also be evaluated to determine if this approach improves the plan. It is anticipated that the patient will then receive a 10 Gy boost to the seroma cavity which has been contoured. This will be accomplished at 2 Gy per fraction.   This initial treatment will consist of a 3-D conformal technique. The seroma has been contoured as the primary target structure. Additionally, dose volume histograms of both this target as well as the lungs and heart will also be evaluated. Such an approach is necessary to ensure that the target area is adequately covered while the nearby critical  normal structures are adequately spared.  Plan:  The final anticipated total dose therefore will correspond to 60.4 Gy.   Special treatment procedure was performed today due to the extra time and effort required by myself to plan  and prepare this patient for deep inspiration breath hold technique.  I have determined cardiac sparing to be of benefit to this patient to prevent long term cardiac damage due to radiation of the heart.  Bellows were placed on the patient's abdomen. To facilitate cardiac sparing, the patient was coached by the radiation therapists on breath hold techniques and breathing practice was performed. Practice waveforms were obtained. The patient was then scanned while maintaining breath hold in the treatment position.  This image was then transferred over to the imaging specialist. The imaging specialist then created a fusion of the free breathing and breath hold scans using the chest wall as the stable structure. I personally reviewed the fusion in axial, coronal and sagittal image planes.  Excellent cardiac sparing was obtained.  I felt the patient is an appropriate candidate for breath hold and the patient will be treated as such.  The image fusion was then reviewed with the patient to reinforce the necessity of reproducible breath hold.     _______________________________   Jodelle Gross, MD, PhD

## 2017-12-02 NOTE — Progress Notes (Signed)
  Radiation Oncology         [phone] ________________________________  Name: Sara Freeman MRN: [id number]  Date: 12/02/2017  DOB: 10/06/1970  Optical Surface Tracking Plan:  Since intensity modulated radiotherapy (IMRT) and 3D conformal radiation treatment methods are predicated on accurate and precise positioning for treatment, intrafraction motion monitoring is medically necessary to ensure accurate and safe treatment delivery.  The ability to quantify intrafraction motion without excessive ionizing radiation dose can only be performed with optical surface tracking. Accordingly, surface imaging offers the opportunity to obtain 3D measurements of patient position throughout IMRT and 3D treatments without excessive radiation exposure.  I am ordering optical surface tracking for this patient's upcoming course of radiotherapy. ________________________________  Kyung Rudd, MD 12/02/2017 3:11 PM    Reference:   Ursula Alert, J, et al. Surface imaging-based analysis of intrafraction motion for breast radiotherapy patients.Journal of [place], n. 6, nov. 2014. ISSN [id number].   Available at: <http://www.jacmp.org/index.php/jacmp/article/view/4957>.

## 2017-12-03 DIAGNOSIS — Z510 Encounter for antineoplastic radiation therapy: Secondary | ICD-10-CM | POA: Diagnosis not present

## 2017-12-08 ENCOUNTER — Ambulatory Visit: Payer: Self-pay | Admitting: Radiation Oncology

## 2017-12-08 NOTE — Progress Notes (Signed)
FMLA successfully faxed to [employer] at [phone]. Mailed copy to patient address on file. 

## 2017-12-09 ENCOUNTER — Ambulatory Visit: Payer: No Typology Code available for payment source | Admitting: Radiation Oncology

## 2017-12-10 ENCOUNTER — Ambulatory Visit
Admission: RE | Admit: 2017-12-10 | Discharge: 2017-12-10 | Disposition: A | Discharge status: Home / Self Care | Payer: No Typology Code available for payment source | Source: Ambulatory Visit | Attending: Radiation Oncology | Admitting: Radiation Oncology

## 2017-12-10 DIAGNOSIS — Z510 Encounter for antineoplastic radiation therapy: Secondary | ICD-10-CM | POA: Diagnosis not present

## 2017-12-14 ENCOUNTER — Ambulatory Visit
Admission: RE | Admit: 2017-12-14 | Discharge: 2017-12-14 | Disposition: A | Discharge status: Home / Self Care | Payer: No Typology Code available for payment source | Source: Ambulatory Visit | Attending: Radiation Oncology | Admitting: Radiation Oncology

## 2017-12-14 DIAGNOSIS — Z510 Encounter for antineoplastic radiation therapy: Secondary | ICD-10-CM | POA: Diagnosis not present

## 2017-12-14 MED FILL — LISINOPRIL 5 MG TABLET: 5 | 90 days supply | Qty: 90 | Fill #0

## 2017-12-15 ENCOUNTER — Ambulatory Visit
Admission: RE | Admit: 2017-12-15 | Discharge: 2017-12-15 | Disposition: A | Discharge status: Home / Self Care | Payer: No Typology Code available for payment source | Source: Ambulatory Visit | Attending: Radiation Oncology | Admitting: Radiation Oncology

## 2017-12-15 DIAGNOSIS — Z510 Encounter for antineoplastic radiation therapy: Secondary | ICD-10-CM | POA: Diagnosis not present

## 2017-12-16 ENCOUNTER — Ambulatory Visit
Admission: RE | Admit: 2017-12-16 | Discharge: 2017-12-16 | Disposition: A | Discharge status: Home / Self Care | Payer: No Typology Code available for payment source | Source: Ambulatory Visit | Attending: Radiation Oncology | Admitting: Radiation Oncology

## 2017-12-16 DIAGNOSIS — Z510 Encounter for antineoplastic radiation therapy: Secondary | ICD-10-CM | POA: Diagnosis not present

## 2017-12-17 ENCOUNTER — Ambulatory Visit
Admission: RE | Admit: 2017-12-17 | Discharge: 2017-12-17 | Disposition: A | Discharge status: Home / Self Care | Payer: No Typology Code available for payment source | Source: Ambulatory Visit | Attending: Radiation Oncology | Admitting: Radiation Oncology

## 2017-12-17 DIAGNOSIS — Z510 Encounter for antineoplastic radiation therapy: Secondary | ICD-10-CM | POA: Diagnosis not present

## 2017-12-17 DIAGNOSIS — C50912 Malignant neoplasm of unspecified site of left female breast: Secondary | ICD-10-CM

## 2017-12-17 DIAGNOSIS — Z170 Estrogen receptor positive status [ER+]: Principal | ICD-10-CM

## 2017-12-17 MED ORDER — RADIAPLEXRX EX GEL
Freq: Two times a day (BID) | CUTANEOUS | Status: DC
  Administered 2017-12-17: 17:00:00 via TOPICAL

## 2017-12-17 MED ORDER — ALRA NON-METALLIC DEODORANT (RAD-ONC)
1.0000 "application " | Freq: Once | TOPICAL | Status: AC
  Administered 2017-12-17: 1 via TOPICAL

## 2017-12-20 ENCOUNTER — Ambulatory Visit
Admission: RE | Admit: 2017-12-20 | Discharge: 2017-12-20 | Disposition: A | Discharge status: Home / Self Care | Payer: No Typology Code available for payment source | Source: Ambulatory Visit | Attending: Radiation Oncology | Admitting: Radiation Oncology

## 2017-12-20 DIAGNOSIS — Z510 Encounter for antineoplastic radiation therapy: Secondary | ICD-10-CM | POA: Diagnosis present

## 2017-12-20 DIAGNOSIS — D0512 Intraductal carcinoma in situ of left breast: Secondary | ICD-10-CM | POA: Insufficient documentation

## 2017-12-21 ENCOUNTER — Ambulatory Visit
Admission: RE | Admit: 2017-12-21 | Discharge: 2017-12-21 | Disposition: A | Discharge status: Home / Self Care | Payer: No Typology Code available for payment source | Source: Ambulatory Visit | Attending: Radiation Oncology | Admitting: Radiation Oncology

## 2017-12-21 DIAGNOSIS — Z510 Encounter for antineoplastic radiation therapy: Secondary | ICD-10-CM | POA: Diagnosis not present

## 2017-12-22 ENCOUNTER — Ambulatory Visit
Admission: RE | Admit: 2017-12-22 | Discharge: 2017-12-22 | Disposition: A | Discharge status: Home / Self Care | Payer: No Typology Code available for payment source | Source: Ambulatory Visit | Attending: Radiation Oncology | Admitting: Radiation Oncology

## 2017-12-22 DIAGNOSIS — Z510 Encounter for antineoplastic radiation therapy: Secondary | ICD-10-CM | POA: Diagnosis not present

## 2017-12-23 ENCOUNTER — Ambulatory Visit
Admission: RE | Admit: 2017-12-23 | Discharge: 2017-12-23 | Disposition: A | Discharge status: Home / Self Care | Payer: No Typology Code available for payment source | Source: Ambulatory Visit | Attending: Radiation Oncology | Admitting: Radiation Oncology

## 2017-12-23 DIAGNOSIS — Z510 Encounter for antineoplastic radiation therapy: Secondary | ICD-10-CM | POA: Diagnosis not present

## 2017-12-24 ENCOUNTER — Other Ambulatory Visit: Payer: Self-pay | Admitting: Radiation Oncology

## 2017-12-24 ENCOUNTER — Ambulatory Visit
Admission: RE | Admit: 2017-12-24 | Discharge: 2017-12-24 | Disposition: A | Discharge status: Home / Self Care | Payer: No Typology Code available for payment source | Source: Ambulatory Visit | Attending: Radiation Oncology | Admitting: Radiation Oncology

## 2017-12-24 ENCOUNTER — Encounter: Payer: Self-pay | Admitting: Family Medicine

## 2017-12-24 ENCOUNTER — Telehealth (HOSPITAL_COMMUNITY): Payer: Self-pay

## 2017-12-24 DIAGNOSIS — Z510 Encounter for antineoplastic radiation therapy: Secondary | ICD-10-CM | POA: Diagnosis not present

## 2017-12-24 MED ORDER — ONDANSETRON HCL 8 MG PO TABS: 8.0000 mg | ORAL_TABLET | Freq: Three times a day (TID) | ORAL | 0 refills | Status: DC | PRN

## 2017-12-24 MED FILL — ONDANSETRON HCL 8 MG TABLET: 8 | 10 days supply | Qty: 30 | Fill #0

## 2017-12-24 NOTE — Telephone Encounter (Signed)
Patient called stating her calcium pills were constipating her and wanted to know if she could just take 1 a day instead of 2. Reviewed with Dr. Delton Coombes. He said it is okay for patient to just take 1 calcium per day. Patient notified and verbalized understanding.

## 2017-12-27 ENCOUNTER — Ambulatory Visit
Admission: RE | Admit: 2017-12-27 | Discharge: 2017-12-27 | Disposition: A | Discharge status: Home / Self Care | Payer: No Typology Code available for payment source | Source: Ambulatory Visit | Attending: Radiation Oncology | Admitting: Radiation Oncology

## 2017-12-27 DIAGNOSIS — Z510 Encounter for antineoplastic radiation therapy: Secondary | ICD-10-CM | POA: Diagnosis not present

## 2017-12-28 ENCOUNTER — Ambulatory Visit
Admission: RE | Admit: 2017-12-28 | Discharge: 2017-12-28 | Disposition: A | Discharge status: Home / Self Care | Payer: No Typology Code available for payment source | Source: Ambulatory Visit | Attending: Radiation Oncology | Admitting: Radiation Oncology

## 2017-12-28 DIAGNOSIS — Z510 Encounter for antineoplastic radiation therapy: Secondary | ICD-10-CM | POA: Diagnosis not present

## 2017-12-29 ENCOUNTER — Ambulatory Visit
Admission: RE | Admit: 2017-12-29 | Discharge: 2017-12-29 | Disposition: A | Discharge status: Home / Self Care | Payer: No Typology Code available for payment source | Source: Ambulatory Visit | Attending: Radiation Oncology | Admitting: Radiation Oncology

## 2017-12-29 DIAGNOSIS — Z510 Encounter for antineoplastic radiation therapy: Secondary | ICD-10-CM | POA: Diagnosis not present

## 2017-12-30 ENCOUNTER — Ambulatory Visit
Admission: RE | Admit: 2017-12-30 | Discharge: 2017-12-30 | Disposition: A | Discharge status: Home / Self Care | Payer: No Typology Code available for payment source | Source: Ambulatory Visit | Attending: Radiation Oncology | Admitting: Radiation Oncology

## 2017-12-30 DIAGNOSIS — Z510 Encounter for antineoplastic radiation therapy: Secondary | ICD-10-CM | POA: Diagnosis not present

## 2017-12-31 ENCOUNTER — Ambulatory Visit: Payer: No Typology Code available for payment source

## 2018-01-03 ENCOUNTER — Ambulatory Visit
Admission: RE | Admit: 2018-01-03 | Discharge: 2018-01-03 | Disposition: A | Discharge status: Home / Self Care | Payer: No Typology Code available for payment source | Source: Ambulatory Visit | Attending: Radiation Oncology | Admitting: Radiation Oncology

## 2018-01-03 DIAGNOSIS — Z510 Encounter for antineoplastic radiation therapy: Secondary | ICD-10-CM | POA: Diagnosis not present

## 2018-01-04 ENCOUNTER — Ambulatory Visit
Admission: RE | Admit: 2018-01-04 | Discharge: 2018-01-04 | Disposition: A | Discharge status: Home / Self Care | Payer: No Typology Code available for payment source | Source: Ambulatory Visit | Attending: Radiation Oncology | Admitting: Radiation Oncology

## 2018-01-04 DIAGNOSIS — Z510 Encounter for antineoplastic radiation therapy: Secondary | ICD-10-CM | POA: Diagnosis not present

## 2018-01-05 ENCOUNTER — Ambulatory Visit
Admission: RE | Admit: 2018-01-05 | Discharge: 2018-01-05 | Disposition: A | Discharge status: Home / Self Care | Payer: No Typology Code available for payment source | Source: Ambulatory Visit | Attending: Radiation Oncology | Admitting: Radiation Oncology

## 2018-01-05 DIAGNOSIS — Z510 Encounter for antineoplastic radiation therapy: Secondary | ICD-10-CM | POA: Diagnosis not present

## 2018-01-06 ENCOUNTER — Ambulatory Visit
Admission: RE | Admit: 2018-01-06 | Discharge: 2018-01-06 | Disposition: A | Discharge status: Home / Self Care | Payer: No Typology Code available for payment source | Source: Ambulatory Visit | Attending: Radiation Oncology | Admitting: Radiation Oncology

## 2018-01-06 DIAGNOSIS — Z510 Encounter for antineoplastic radiation therapy: Secondary | ICD-10-CM | POA: Diagnosis not present

## 2018-01-07 ENCOUNTER — Ambulatory Visit
Admission: RE | Admit: 2018-01-07 | Discharge: 2018-01-07 | Disposition: A | Discharge status: Home / Self Care | Payer: No Typology Code available for payment source | Source: Ambulatory Visit | Attending: Radiation Oncology | Admitting: Radiation Oncology

## 2018-01-07 DIAGNOSIS — Z510 Encounter for antineoplastic radiation therapy: Secondary | ICD-10-CM | POA: Diagnosis not present

## 2018-01-10 ENCOUNTER — Ambulatory Visit
Admission: RE | Admit: 2018-01-10 | Discharge: 2018-01-10 | Disposition: A | Discharge status: Home / Self Care | Payer: No Typology Code available for payment source | Source: Ambulatory Visit | Attending: Radiation Oncology | Admitting: Radiation Oncology

## 2018-01-10 DIAGNOSIS — Z510 Encounter for antineoplastic radiation therapy: Secondary | ICD-10-CM | POA: Diagnosis not present

## 2018-01-11 ENCOUNTER — Ambulatory Visit
Admission: RE | Admit: 2018-01-11 | Discharge: 2018-01-11 | Disposition: A | Discharge status: Home / Self Care | Payer: No Typology Code available for payment source | Source: Ambulatory Visit | Attending: Radiation Oncology | Admitting: Radiation Oncology

## 2018-01-11 DIAGNOSIS — Z510 Encounter for antineoplastic radiation therapy: Secondary | ICD-10-CM | POA: Diagnosis not present

## 2018-01-12 ENCOUNTER — Ambulatory Visit
Admission: RE | Admit: 2018-01-12 | Discharge: 2018-01-12 | Disposition: A | Discharge status: Home / Self Care | Payer: No Typology Code available for payment source | Source: Ambulatory Visit | Attending: Radiation Oncology | Admitting: Radiation Oncology

## 2018-01-12 DIAGNOSIS — Z510 Encounter for antineoplastic radiation therapy: Secondary | ICD-10-CM | POA: Diagnosis not present

## 2018-01-13 ENCOUNTER — Ambulatory Visit
Admission: RE | Admit: 2018-01-13 | Discharge: 2018-01-13 | Disposition: A | Discharge status: Home / Self Care | Payer: No Typology Code available for payment source | Source: Ambulatory Visit | Attending: Radiation Oncology | Admitting: Radiation Oncology

## 2018-01-13 DIAGNOSIS — Z510 Encounter for antineoplastic radiation therapy: Secondary | ICD-10-CM | POA: Diagnosis not present

## 2018-01-14 ENCOUNTER — Ambulatory Visit
Admission: RE | Admit: 2018-01-14 | Discharge: 2018-01-14 | Disposition: A | Discharge status: Home / Self Care | Payer: No Typology Code available for payment source | Source: Ambulatory Visit | Attending: Radiation Oncology | Admitting: Radiation Oncology

## 2018-01-14 DIAGNOSIS — Z510 Encounter for antineoplastic radiation therapy: Secondary | ICD-10-CM | POA: Diagnosis not present

## 2018-01-17 ENCOUNTER — Ambulatory Visit
Admission: RE | Admit: 2018-01-17 | Discharge: 2018-01-17 | Disposition: A | Discharge status: Home / Self Care | Payer: No Typology Code available for payment source | Source: Ambulatory Visit | Attending: Radiation Oncology | Admitting: Radiation Oncology

## 2018-01-17 DIAGNOSIS — Z510 Encounter for antineoplastic radiation therapy: Secondary | ICD-10-CM | POA: Insufficient documentation

## 2018-01-17 DIAGNOSIS — D0512 Intraductal carcinoma in situ of left breast: Secondary | ICD-10-CM | POA: Insufficient documentation

## 2018-01-18 ENCOUNTER — Ambulatory Visit
Admission: RE | Admit: 2018-01-18 | Discharge: 2018-01-18 | Disposition: A | Discharge status: Home / Self Care | Payer: No Typology Code available for payment source | Source: Ambulatory Visit | Attending: Radiation Oncology | Admitting: Radiation Oncology

## 2018-01-18 DIAGNOSIS — Z510 Encounter for antineoplastic radiation therapy: Secondary | ICD-10-CM | POA: Diagnosis not present

## 2018-01-19 ENCOUNTER — Ambulatory Visit
Admission: RE | Admit: 2018-01-19 | Discharge: 2018-01-19 | Disposition: A | Discharge status: Home / Self Care | Payer: No Typology Code available for payment source | Source: Ambulatory Visit | Attending: Radiation Oncology | Admitting: Radiation Oncology

## 2018-01-19 DIAGNOSIS — Z510 Encounter for antineoplastic radiation therapy: Secondary | ICD-10-CM | POA: Diagnosis not present

## 2018-01-21 ENCOUNTER — Ambulatory Visit
Admission: RE | Admit: 2018-01-21 | Discharge: 2018-01-21 | Disposition: A | Discharge status: Home / Self Care | Payer: No Typology Code available for payment source | Source: Ambulatory Visit | Attending: Radiation Oncology | Admitting: Radiation Oncology

## 2018-01-21 DIAGNOSIS — Z510 Encounter for antineoplastic radiation therapy: Secondary | ICD-10-CM | POA: Diagnosis not present

## 2018-01-24 ENCOUNTER — Ambulatory Visit: Payer: No Typology Code available for payment source

## 2018-01-24 ENCOUNTER — Ambulatory Visit
Admission: RE | Admit: 2018-01-24 | Discharge: 2018-01-24 | Disposition: A | Discharge status: Home / Self Care | Payer: No Typology Code available for payment source | Source: Ambulatory Visit | Attending: Radiation Oncology | Admitting: Radiation Oncology

## 2018-01-24 DIAGNOSIS — Z510 Encounter for antineoplastic radiation therapy: Secondary | ICD-10-CM | POA: Diagnosis not present

## 2018-01-24 MED FILL — TAMOXIFEN CITRATE 20 MG TAB: 20 | 90 days supply | Qty: 90 | Fill #1

## 2018-01-25 ENCOUNTER — Ambulatory Visit: Payer: No Typology Code available for payment source

## 2018-01-25 ENCOUNTER — Ambulatory Visit
Admission: RE | Admit: 2018-01-25 | Discharge: 2018-01-25 | Disposition: A | Discharge status: Home / Self Care | Payer: No Typology Code available for payment source | Source: Ambulatory Visit | Attending: Radiation Oncology | Admitting: Radiation Oncology

## 2018-01-25 DIAGNOSIS — Z510 Encounter for antineoplastic radiation therapy: Secondary | ICD-10-CM | POA: Diagnosis not present

## 2018-01-26 ENCOUNTER — Ambulatory Visit
Admission: RE | Admit: 2018-01-26 | Discharge: 2018-01-26 | Disposition: A | Discharge status: Home / Self Care | Payer: No Typology Code available for payment source | Source: Ambulatory Visit | Attending: Radiation Oncology | Admitting: Radiation Oncology

## 2018-01-26 DIAGNOSIS — Z510 Encounter for antineoplastic radiation therapy: Secondary | ICD-10-CM | POA: Diagnosis not present

## 2018-01-27 ENCOUNTER — Ambulatory Visit
Admission: RE | Admit: 2018-01-27 | Discharge: 2018-01-27 | Disposition: A | Discharge status: Home / Self Care | Payer: No Typology Code available for payment source | Source: Ambulatory Visit | Attending: Radiation Oncology | Admitting: Radiation Oncology

## 2018-01-27 DIAGNOSIS — Z510 Encounter for antineoplastic radiation therapy: Secondary | ICD-10-CM | POA: Diagnosis not present

## 2018-01-28 ENCOUNTER — Ambulatory Visit
Admission: RE | Admit: 2018-01-28 | Discharge: 2018-01-28 | Disposition: A | Discharge status: Home / Self Care | Payer: No Typology Code available for payment source | Source: Ambulatory Visit | Attending: Radiation Oncology | Admitting: Radiation Oncology

## 2018-01-28 ENCOUNTER — Ambulatory Visit: Payer: No Typology Code available for payment source

## 2018-01-28 DIAGNOSIS — Z510 Encounter for antineoplastic radiation therapy: Secondary | ICD-10-CM | POA: Diagnosis not present

## 2018-01-28 DIAGNOSIS — D0512 Intraductal carcinoma in situ of left breast: Secondary | ICD-10-CM

## 2018-01-28 MED ORDER — RADIAPLEXRX EX GEL
Freq: Once | CUTANEOUS | Status: AC
  Administered 2018-01-28: 16:00:00 via TOPICAL

## 2018-01-31 ENCOUNTER — Ambulatory Visit
Admission: RE | Admit: 2018-01-31 | Discharge: 2018-01-31 | Disposition: A | Discharge status: Home / Self Care | Payer: No Typology Code available for payment source | Source: Ambulatory Visit | Attending: Radiation Oncology | Admitting: Radiation Oncology

## 2018-01-31 ENCOUNTER — Encounter: Payer: Self-pay | Admitting: Radiation Oncology

## 2018-01-31 DIAGNOSIS — Z510 Encounter for antineoplastic radiation therapy: Secondary | ICD-10-CM | POA: Diagnosis not present

## 2018-02-01 ENCOUNTER — Encounter: Payer: Self-pay | Admitting: Radiation Oncology

## 2018-02-03 ENCOUNTER — Encounter: Payer: Self-pay | Admitting: Radiation Oncology

## 2018-02-09 NOTE — Progress Notes (Signed)
  Radiation Oncology         [phone] ________________________________  Name: Sara Freeman MRN: [id number]  Date: 01/31/2018  DOB: February 04, 1971  End of Treatment Note  Diagnosis:   47 y.o. female with Stage [id number], ER/PR positive DCIS of the left breast  Indication for treatment:  Curative       Radiation treatment dates:   12/14/2017 - 01/31/2018  Site/dose:   The patient initially received a dose of 50.4 Gy in 28 fractions to the left breast using whole-breast tangent fields. This was delivered using a 3-D conformal technique. The patient then received a boost to the seroma. This delivered an additional 10 Gy in 5 fractions using a 3-D technique. The total dose was 60.4 Gy.  Narrative: The patient tolerated radiation treatment relatively well.  The patient had some expected skin irritation as she progressed during treatment. Moist desquamation was not present at the end of treatment.  Plan: The patient has completed radiation treatment. The patient will return to radiation oncology clinic for routine followup in one month. I advised the patient to call or return sooner if they have any questions or concerns related to their recovery or treatment. ________________________________  Jodelle Gross, MD, PhD  This document serves as a record of services personally performed by Kyung Rudd, MD. It was created on his behalf by Rae Lips, a trained medical scribe. The creation of this record is based on the scribe's personal observations and the provider's statements to them. This document has been checked and approved by the attending provider.

## 2018-02-11 ENCOUNTER — Other Ambulatory Visit: Payer: Self-pay | Admitting: Obstetrics & Gynecology

## 2018-02-18 ENCOUNTER — Encounter: Payer: Self-pay | Admitting: Obstetrics & Gynecology

## 2018-02-18 ENCOUNTER — Ambulatory Visit: Payer: Self-pay | Admitting: Physician Assistant

## 2018-02-18 ENCOUNTER — Other Ambulatory Visit (HOSPITAL_COMMUNITY)
Admission: RE | Admit: 2018-02-18 | Discharge: 2018-02-18 | Disposition: A | Discharge status: Home / Self Care | Payer: No Typology Code available for payment source | Source: Ambulatory Visit | Attending: Obstetrics & Gynecology | Admitting: Obstetrics & Gynecology

## 2018-02-18 ENCOUNTER — Other Ambulatory Visit: Payer: Self-pay

## 2018-02-18 ENCOUNTER — Ambulatory Visit (INDEPENDENT_AMBULATORY_CARE_PROVIDER_SITE_OTHER): Payer: No Typology Code available for payment source | Admitting: Obstetrics & Gynecology

## 2018-02-18 ENCOUNTER — Telehealth: Payer: Self-pay | Admitting: Family Medicine

## 2018-02-18 VITALS — BP 122/80 | HR 99 | Ht 63.0 in | Wt 173.0 lb

## 2018-02-18 DIAGNOSIS — D0512 Intraductal carcinoma in situ of left breast: Secondary | ICD-10-CM | POA: Diagnosis not present

## 2018-02-18 DIAGNOSIS — Z01419 Encounter for gynecological examination (general) (routine) without abnormal findings: Secondary | ICD-10-CM

## 2018-02-18 DIAGNOSIS — Z1151 Encounter for screening for human papillomavirus (HPV): Secondary | ICD-10-CM | POA: Diagnosis not present

## 2018-02-18 NOTE — Progress Notes (Signed)
Subjective:     Sara Freeman is a 47 y.o. female here for a routine exam.  Patient's last menstrual period was 02/10/2018. G1P1001 Birth Control Method:  BTL Menstrual Calendar(currently): regular  Current complaints: none.   Current acute medical issues:  Recently finished treatment for her breast cancer   Recent Gynecologic History Patient's last menstrual period was 02/10/2018. Last Pap: 2018,  normal Last mammogram: see report,    Past Medical History:  Diagnosis Date  . Allergy   . Breast cancer ([place])   . Breast cancer ([place]) 09/09/2017  . Endometriosis   . Family history of breast cancer   . Hypertension     Past Surgical History:  Procedure Laterality Date  . BREAST BIOPSY Left 2017   benign  . BREAST BIOPSY Left 08/27/2017   Needle core biopsy  . BREAST LUMPECTOMY Left 10/19/2017   Procedure: LEFT BREAST RE EXCISION LUMPECTOMY;  Surgeon: Erroll Luna, MD;  Location: [facility];  Service: General;  Laterality: Left;  . BREAST LUMPECTOMY WITH RADIOACTIVE SEED LOCALIZATION Left 09/22/2017   Procedure: BREAST LUMPECTOMY  WITH RADIOACTIVE SEED LOCALIZATION X 2;  Surgeon: Erroll Luna, MD;  Location: [facility];  Service: General;  Laterality: Left;  . TUBAL LIGATION  1999   [facility]    OB History    Gravida  1   Para  1   Term  1   Preterm      AB      Living  1     SAB      TAB      Ectopic      Multiple      Live Births  1           Social History   Socioeconomic History  . Marital status: Married    Spouse name: [name]  . Number of children: 1  . Years of education: 74  . Highest education level: Associate degree: academic program  Occupational History  . 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 Financial resource strain: Not hard at all  . Food insecurity:    Worry: Never true    Inability: Never true  . Transportation needs:    Medical: No    Non-medical: No  Tobacco Use  . Smoking  status: Former Smoker    Packs/day: 1.00    Types: Cigarettes    Start date: 07/20/1986    Last attempt to quit: 07/20/2006    Years since quitting: 11.5  . Smokeless tobacco: Never Used  Substance and Sexual Activity  . Alcohol use: No  . Drug use: No  . Sexual activity: Yes    Birth control/protection: Surgical    Comment: tubal  Lifestyle  . Physical activity:    Days per week: 0 days    Minutes per session: 0 min  . Stress: To some extent  Relationships  . Social connections:    Talks on phone: More than three times a week    Gets together: More than three times a week    Attends religious service: More than 4 times per year    Active member of club or organization: No    Attends meetings of clubs or organizations: Never    Relationship status: Married  Other Topics Concern  . Not on file  Social History Narrative   Lives with husband Kandra Nicolas is at home but spends  Time with his GF  Has 2 dachshund   Likes to read   Goes camping once a month    Family History  Problem Relation Age of Onset  . CAD Other   . Heart disease Other   . Stroke Paternal Grandfather   . Stroke Paternal Grandmother   . Aneurysm Maternal Grandfather   . Heart attack Father   . Hypertension Father   . COPD Father   . Heart disease Father        39 died  . Thyroid disease Mother   . Hypertension Mother   . Hypertension Brother   . Stroke Paternal Uncle   . Breast cancer Other 60       metastatic     Current Outpatient Medications:  .  acetaminophen (TYLENOL) 500 MG tablet, Take 500-1,000 mg by mouth every 6 (six) hours as needed (for pain.)., Disp: , Rfl:  .  calcium-vitamin D 250-100 MG-UNIT tablet, Take 1 tablet by mouth 2 (two) times daily. Taking calcium 600 mg plus vitamin D twice daily, Disp: , Rfl:  .  chlorthalidone (HYGROTON) 25 MG tablet, Take 1 tablet (25 mg total) by mouth daily., Disp: 90 tablet, Rfl: 3 .  ibuprofen (ADVIL,MOTRIN) 800 MG tablet, Take 1 tablet (800 mg  total) by mouth every 8 (eight) hours as needed., Disp: 30 tablet, Rfl: 0 .  levocetirizine (XYZAL ALLERGY 24HR) 5 MG tablet, , Disp: , Rfl:  .  lisinopril (PRINIVIL,ZESTRIL) 5 MG tablet, Take 1 tablet (5 mg total) by mouth daily., Disp: 90 tablet, Rfl: 3 .  tamoxifen (NOLVADEX) 20 MG tablet, Take 1 tablet (20 mg total) by mouth daily., Disp: 90 tablet, Rfl: 3  Review of Systems  Review of Systems  Constitutional: Negative for fever, chills, weight loss, malaise/fatigue and diaphoresis.  HENT: Negative for hearing loss, ear pain, nosebleeds, congestion, sore throat, neck pain, tinnitus and ear discharge.   Eyes: Negative for blurred vision, double vision, photophobia, pain, discharge and redness.  Respiratory: Negative for cough, hemoptysis, sputum production, shortness of breath, wheezing and stridor.   Cardiovascular: Negative for chest pain, palpitations, orthopnea, claudication, leg swelling and PND.  Gastrointestinal: negative for abdominal pain. Negative for heartburn, nausea, vomiting, diarrhea, constipation, blood in stool and melena.  Genitourinary: Negative for dysuria, urgency, frequency, hematuria and flank pain.  Musculoskeletal: Negative for myalgias, back pain, joint pain and falls.  Skin: Negative for itching and rash.  Neurological: Negative for dizziness, tingling, tremors, sensory change, speech change, focal weakness, seizures, loss of consciousness, weakness and headaches.  Endo/Heme/Allergies: Negative for environmental allergies and polydipsia. Does not bruise/bleed easily.  Psychiatric/Behavioral: Negative for depression, suicidal ideas, hallucinations, memory loss and substance abuse. The patient is not nervous/anxious and does not have insomnia.        Objective:  Blood pressure 122/80, pulse 99, height 5\' 3"  (1.6 m), weight 173 lb (78.5 kg), last menstrual period 02/10/2018.   Physical Exam  Vitals reviewed. Constitutional: She is oriented to person, place, and  time. She appears well-developed and well-nourished.  HENT:  Head: Normocephalic and atraumatic.        Right Ear: External ear normal.  Left Ear: External ear normal.  Nose: Nose normal.  Mouth/Throat: Oropharynx is clear and moist.  Eyes: Conjunctivae and EOM are normal. Pupils are equal, round, and reactive to light. Right eye exhibits no discharge. Left eye exhibits no discharge. No scleral icterus.  Neck: Normal range of motion. Neck supple. No tracheal deviation present. No thyromegaly present.  Cardiovascular: Normal rate, regular  rhythm, normal heart sounds and intact distal pulses.  Exam reveals no gallop and no friction rub.   No murmur heard. Respiratory: Effort normal and breath sounds normal. No respiratory distress. She has no wheezes. She has no rales. She exhibits no tenderness.  GI: Soft. Bowel sounds are normal. She exhibits no distension and no mass. There is no tenderness. There is no rebound and no guarding.  Genitourinary:  Breasts no masses skin changes or nipple changes bilaterally      Vulva is normal without lesions Vagina is pink moist without discharge Cervix normal in appearance and pap is done Uterus is normal size shape and contour Adnexa is negative with normal sized ovaries   Musculoskeletal: Normal range of motion. She exhibits no edema and no tenderness.  Neurological: She is alert and oriented to person, place, and time. She has normal reflexes. She displays normal reflexes. No cranial nerve deficit. She exhibits normal muscle tone. Coordination normal.  Skin: Skin is warm and dry. No rash noted. No erythema. No pallor.  Psychiatric: She has a normal mood and affect. Her behavior is normal. Judgment and thought content normal.       Medications Ordered at today's visit: No orders of the defined types were placed in this encounter.   Other orders placed at today's visit: No orders of the defined types were placed in this  encounter.     Assessment:    Healthy female exam.    Plan:    Contraception: vaginal spermicide. Follow up in: 1 year.     No follow-ups on file.

## 2018-02-18 NOTE — Telephone Encounter (Signed)
Appointment requested for 9/20 at 2:10. Patient requested a Friday afternoon and this was the first available

## 2018-02-21 ENCOUNTER — Encounter: Payer: Self-pay | Admitting: Radiation Oncology

## 2018-02-22 LAB — CYTOLOGY - PAP
Diagnosis: NEGATIVE
HPV: NOT DETECTED

## 2018-02-28 MED FILL — CHLORTHALIDONE 25 MG TAB: 25 | 90 days supply | Qty: 90 | Fill #4

## 2018-03-14 MED FILL — LISINOPRIL 5 MG TABLET: 5 | 90 days supply | Qty: 90 | Fill #1

## 2018-03-31 ENCOUNTER — Ambulatory Visit
Admission: RE | Admit: 2018-03-31 | Discharge: 2018-03-31 | Disposition: A | Discharge status: Home / Self Care | Payer: No Typology Code available for payment source | Source: Ambulatory Visit | Attending: Radiation Oncology | Admitting: Radiation Oncology

## 2018-03-31 ENCOUNTER — Other Ambulatory Visit: Payer: Self-pay

## 2018-03-31 ENCOUNTER — Encounter: Payer: Self-pay | Admitting: Radiation Oncology

## 2018-03-31 VITALS — BP 130/88 | HR 81 | Temp 99.0°F | Resp 18 | Ht 63.0 in | Wt 178.6 lb

## 2018-03-31 DIAGNOSIS — Z880 Allergy status to penicillin: Secondary | ICD-10-CM | POA: Insufficient documentation

## 2018-03-31 DIAGNOSIS — Z881 Allergy status to other antibiotic agents status: Secondary | ICD-10-CM | POA: Diagnosis not present

## 2018-03-31 DIAGNOSIS — Z170 Estrogen receptor positive status [ER+]: Secondary | ICD-10-CM | POA: Diagnosis not present

## 2018-03-31 DIAGNOSIS — Z79899 Other long term (current) drug therapy: Secondary | ICD-10-CM | POA: Insufficient documentation

## 2018-03-31 DIAGNOSIS — D0512 Intraductal carcinoma in situ of left breast: Secondary | ICD-10-CM | POA: Insufficient documentation

## 2018-04-01 NOTE — Progress Notes (Signed)
Radiation Oncology         [phone] ________________________________  Name: Sara Freeman MRN: [id number]  Date of Service: 03/31/2018  DOB: [birth year]  Post Treatment Note  CC: Jabier Gauss, MD  Diagnosis:   ER/PR positive DCIS of the left breast  Interval Since Last Radiation:  8 weeks   12/14/2017 - 01/31/2018:  The patient initially received a dose of 50.4 Gy in 28 fractions to the left breast using whole-breast tangent fields. This was delivered using a 3-D conformal technique. The patient then received a boost to the seroma. This delivered an additional 10 Gy in 5 fractions using a 3-D technique. The total dose was 60.4 Gy.  Narrative:  The patient returns today for routine follow-up. During treatment she did very well with radiotherapy and did not have significant desquamation.                             On review of systems, the patient states she's doing well. She denies concerns with her antiestrogen therapy and denies any trouble with her skin at this time.  ALLERGIES:  is allergic to penicillins and azithromycin.  Meds: Current Outpatient Medications  Medication Sig Dispense Refill  . acetaminophen (TYLENOL) 500 MG tablet Take 500-1,000 mg by mouth every 6 (six) hours as needed (for pain.).    Marland Kitchen calcium-vitamin D 250-100 MG-UNIT tablet Take 1 tablet by mouth 2 (two) times daily. Taking calcium 600 mg plus vitamin D twice daily    . chlorthalidone (HYGROTON) 25 MG tablet Take 1 tablet (25 mg total) by mouth daily. 90 tablet 3  . lisinopril (PRINIVIL,ZESTRIL) 5 MG tablet Take 1 tablet (5 mg total) by mouth daily. 90 tablet 3  . tamoxifen (NOLVADEX) 20 MG tablet Take 1 tablet (20 mg total) by mouth daily. 90 tablet 3  . ibuprofen (ADVIL,MOTRIN) 800 MG tablet Take 1 tablet (800 mg total) by mouth every 8 (eight) hours as needed. (Patient not taking: Reported on 03/31/2018) 30 tablet 0  . levocetirizine (XYZAL ALLERGY 24HR) 5 MG tablet      No  current facility-administered medications for this encounter.     Physical Findings:  height is 5\' 3"  (1.6 m) and weight is 178 lb 9.6 oz (81 kg). Her oral temperature is 99 F (37.2 C). Her blood pressure is 130/88 and her pulse is 81. Her respiration is 18 and oxygen saturation is 100%.  Pain Assessment Pain Score: 0-No pain/10 In general this is a well appearing caucasian female in no acute distress. She's alert and oriented x4 and appropriate throughout the examination. Cardiopulmonary assessment is negative for acute distress and she exhibits normal effort. The left breast was examined and reveals mild hyperpigmentation without desquamation.   Lab Findings: Lab Results  Component Value Date   WBC 11.4 (H) 09/16/2017   HGB 13.5 09/16/2017   HCT 39.6 09/16/2017   MCV 86.3 09/16/2017   PLT 264 09/16/2017     Radiographic Findings: No results found.  Impression/Plan: 1. ER/PR positive DCIS of the left breast. The patient has been doing well since completion of radiotherapy. We discussed that we would be happy to continue to follow her as needed, but she will also continue to follow up with Dr. Delton Coombes in medical oncology. She was counseled on skin care as well as measures to avoid sun exposure to this area.  2. Survivorship. We discussed the importance of survivorship  which will continue with [facility]. She was also given the monthly calendar for access to resources offered within the cancer center.     Carola Rhine, PAC

## 2018-04-08 ENCOUNTER — Ambulatory Visit (INDEPENDENT_AMBULATORY_CARE_PROVIDER_SITE_OTHER): Payer: No Typology Code available for payment source | Admitting: Physician Assistant

## 2018-04-08 ENCOUNTER — Encounter: Payer: Self-pay | Admitting: Physician Assistant

## 2018-04-08 VITALS — BP 138/96 | HR 97 | Temp 99.9°F | Ht 63.0 in | Wt 179.0 lb

## 2018-04-08 DIAGNOSIS — I10 Essential (primary) hypertension: Secondary | ICD-10-CM | POA: Diagnosis not present

## 2018-04-08 MED ORDER — AMLODIPINE BESYLATE 5 MG PO TABS: 5.0000 mg | ORAL_TABLET | Freq: Every day | ORAL | 3 refills | Status: DC

## 2018-04-08 MED FILL — AMLODIPINE BESYLATE 5 MG TA: 5 | 90 days supply | Qty: 90 | Fill #0

## 2018-04-08 NOTE — Patient Instructions (Signed)
In a few days you may receive a survey in the mail or online from [employer] regarding your visit with us today. Please take a moment to fill this out. Your feedback is very important to our whole office. It can help us better understand your needs as well as improve your experience and satisfaction. Thank you for taking your time to complete it. We care about you.  Briyah Wheelwright, PA-C  

## 2018-04-11 NOTE — Progress Notes (Signed)
BP (!) 138/96   Pulse 97   Temp 99.9 F (37.7 C) (Oral)   Ht 5\' 3"  (1.6 m)   Wt 179 lb (81.2 kg)   BMI 31.71 kg/m    Subjective:    Patient ID: Sara Freeman, female    DOB: 1970/10/07, 47 y.o.   MRN: [id number]  HPI: Sara Freeman is a 47 y.o. female presenting on 04/08/2018 for New Patient (Initial Visit) and Establish Care  This patient comes in to become established with our practice.  I used to see her some years ago.  She at this pressure has dealt with the treatment of breast cancer.  She reports that overall everything is very good and stable.  She is still under the care of oncology and her surgeon.  She states that she had been taking lisinopril and it caused a cough.  After she stopped it within 2 weeks the cough went away.  Her blood pressure is still mildly elevated.  We have had a discussion about trying a very mild blood pressure medicine to see if we can improve this.  All of her labs are reviewed.  In addition her medications are reviewed.  She states she does not have any other problems at this time.  Past Medical History:  Diagnosis Date  . Allergy   . Breast cancer ([place])   . Breast cancer ([place]) 09/09/2017  . Endometriosis   . Family history of breast cancer   . Hypertension    Relevant past medical, surgical, family and social history reviewed and updated as indicated. Interim medical history since our last visit reviewed. Allergies and medications reviewed and updated. DATA REVIEWED: CHART IN EPIC  Family History reviewed for pertinent findings.  Review of Systems  Constitutional: Negative.   HENT: Negative.   Eyes: Negative.   Respiratory: Negative.   Gastrointestinal: Negative.   Genitourinary: Negative.     Allergies as of 04/08/2018      Reactions   Lisinopril Cough   Penicillins Other (See Comments)   UNSPECIFIED REACTION OF CHILDHOOD Has patient had a PCN reaction causing immediate rash, facial/tongue/throat swelling, SOB or lightheadedness  with hypotension: Unknown Has patient had a PCN reaction causing severe rash involving mucus membranes or skin necrosis: Unknown Has patient had a PCN reaction that required hospitalization: Unknown Has patient had a PCN reaction occurring within the last 10 years: Unknown If all of the above answers are "NO", then may proceed with Cephalosporin use.   Azithromycin Itching   Itching of skin without rash      Medication List        Accurate as of 04/08/18 11:59 PM. Always use your most recent med list.          amLODipine 5 MG tablet Commonly known as:  NORVASC Take 1 tablet (5 mg total) by mouth daily.   calcium-vitamin D 250-100 MG-UNIT tablet Take 1 tablet by mouth 2 (two) times daily. Taking calcium 600 mg plus vitamin D twice daily   chlorthalidone 25 MG tablet Commonly known as:  HYGROTON Take 1 tablet (25 mg total) by mouth daily.   tamoxifen 20 MG tablet Commonly known as:  NOLVADEX Take 1 tablet (20 mg total) by mouth daily.   XYZAL ALLERGY 24HR 5 MG tablet Generic drug:  levocetirizine          Objective:    BP (!) 138/96   Pulse 97   Temp 99.9 F (37.7 C) (Oral)  Ht 5\' 3"  (1.6 m)   Wt 179 lb (81.2 kg)   BMI 31.71 kg/m   Allergies  Allergen Reactions  . Lisinopril Cough  . Penicillins Other (See Comments)    UNSPECIFIED REACTION OF CHILDHOOD Has patient had a PCN reaction causing immediate rash, facial/tongue/throat swelling, SOB or lightheadedness with hypotension: Unknown Has patient had a PCN reaction causing severe rash involving mucus membranes or skin necrosis: Unknown Has patient had a PCN reaction that required hospitalization: Unknown Has patient had a PCN reaction occurring within the last 10 years: Unknown If all of the above answers are "NO", then may proceed with Cephalosporin use.   . Azithromycin Itching    Itching of skin without rash    Wt Readings from Last 3 Encounters:  04/08/18 179 lb (81.2 kg)  03/31/18 178 lb 9.6 oz (81  kg)  02/18/18 173 lb (78.5 kg)    Physical Exam  Constitutional: She is oriented to person, place, and time. She appears well-developed and well-nourished.  HENT:  Head: Normocephalic and atraumatic.  Eyes: Pupils are equal, round, and reactive to light. Conjunctivae and EOM are normal.  Cardiovascular: Normal rate, regular rhythm, normal heart sounds and intact distal pulses.  Pulmonary/Chest: Effort normal and breath sounds normal.  Abdominal: Soft. Bowel sounds are normal.  Neurological: She is alert and oriented to person, place, and time. She has normal reflexes.  Skin: Skin is warm and dry. No rash noted.  Psychiatric: She has a normal mood and affect. Her behavior is normal. Judgment and thought content normal.    Results for orders placed or performed in visit on 02/18/18  Cytology - PAP  Result Value Ref Range   Adequacy      Satisfactory for evaluation  endocervical/transformation zone component PRESENT.   Diagnosis      NEGATIVE FOR INTRAEPITHELIAL LESIONS OR MALIGNANCY.   HPV NOT DETECTED    Material Submitted CervicoVaginal Pap [ThinPrep Imaged]    CYTOLOGY - PAP PAP RESULT       Assessment & Plan:   1. Hypertension, unspecified type - amLODipine (NORVASC) 5 MG tablet; Take 1 tablet (5 mg total) by mouth daily.  Dispense: 90 tablet; Refill: 3    Continue all other maintenance medications as listed above.  Follow up plan: Return in about 6 weeks (around 05/20/2018) for recheck.  Educational handout given for [place] PA-C [facility] [address]  [place], [place] [address] [phone]   04/11/2018, 3:01 PM

## 2018-04-18 MED FILL — TAMOXIFEN CITRATE 20 MG TAB: 20 | 90 days supply | Qty: 90 | Fill #2

## 2018-04-22 ENCOUNTER — Other Ambulatory Visit (HOSPITAL_COMMUNITY): Payer: No Typology Code available for payment source

## 2018-04-28 ENCOUNTER — Other Ambulatory Visit (HOSPITAL_COMMUNITY): Payer: No Typology Code available for payment source

## 2018-04-29 ENCOUNTER — Ambulatory Visit: Payer: No Typology Code available for payment source | Admitting: Family Medicine

## 2018-04-29 ENCOUNTER — Ambulatory Visit (HOSPITAL_COMMUNITY): Payer: No Typology Code available for payment source | Admitting: Hematology

## 2018-05-06 ENCOUNTER — Ambulatory Visit (HOSPITAL_COMMUNITY): Payer: No Typology Code available for payment source | Admitting: Hematology

## 2018-05-20 ENCOUNTER — Inpatient Hospital Stay (HOSPITAL_COMMUNITY): Payer: No Typology Code available for payment source | Attending: Hematology

## 2018-05-20 DIAGNOSIS — Z87891 Personal history of nicotine dependence: Secondary | ICD-10-CM | POA: Insufficient documentation

## 2018-05-20 DIAGNOSIS — D0512 Intraductal carcinoma in situ of left breast: Secondary | ICD-10-CM | POA: Insufficient documentation

## 2018-05-20 DIAGNOSIS — Z79899 Other long term (current) drug therapy: Secondary | ICD-10-CM | POA: Diagnosis not present

## 2018-05-20 DIAGNOSIS — Z170 Estrogen receptor positive status [ER+]: Secondary | ICD-10-CM | POA: Diagnosis not present

## 2018-05-20 LAB — COMPREHENSIVE METABOLIC PANEL
ALK PHOS: 37 U/L — AB (ref 38–126)
ALT: 15 U/L (ref 0–44)
AST: 18 U/L (ref 15–41)
Albumin: 3.9 g/dL (ref 3.5–5.0)
Anion gap: 9 (ref 5–15)
BILIRUBIN TOTAL: 0.4 mg/dL (ref 0.3–1.2)
BUN: 13 mg/dL (ref 6–20)
CO2: 27 mmol/L (ref 22–32)
Calcium: 9.1 mg/dL (ref 8.9–10.3)
Chloride: 102 mmol/L (ref 98–111)
Creatinine, Ser: 0.66 mg/dL (ref 0.44–1.00)
GFR calc Af Amer: >60 mL/min (ref >60)
Glucose, Bld: 104 mg/dL — ABNORMAL HIGH (ref 70–99)
POTASSIUM: 3.3 mmol/L — AB (ref 3.5–5.1)
Sodium: 138 mmol/L (ref 135–145)
TOTAL PROTEIN: 7.5 g/dL (ref 6.5–8.1)

## 2018-05-20 LAB — CBC WITH DIFFERENTIAL/PLATELET
Abs Immature Granulocytes: 0.04 10*3/uL (ref 0.00–0.07)
BASOS ABS: 0.1 10*3/uL (ref 0.0–0.1)
Basophils Relative: 1 %
Eosinophils Absolute: 0.1 10*3/uL (ref 0.0–0.5)
Eosinophils Relative: 1 %
HCT: 41 % (ref 36.0–46.0)
HEMOGLOBIN: 13.5 g/dL (ref 12.0–15.0)
IMMATURE GRANULOCYTES: 0 %
Lymphocytes Relative: 22 %
Lymphs Abs: 2 10*3/uL (ref 0.7–4.0)
MCH: 28.7 pg (ref 26.0–34.0)
MCHC: 32.9 g/dL (ref 30.0–36.0)
MCV: 87 fL (ref 80.0–100.0)
Monocytes Absolute: 0.7 10*3/uL (ref 0.1–1.0)
Monocytes Relative: 8 %
NEUTROS ABS: 6 10*3/uL (ref 1.7–7.7)
NEUTROS PCT: 68 %
NRBC: 0 % (ref 0.0–0.2)
Platelets: 245 10*3/uL (ref 150–400)
RBC: 4.71 MIL/uL (ref 3.87–5.11)
RDW: 12.2 % (ref 11.5–15.5)
WBC: 8.9 10*3/uL (ref 4.0–10.5)

## 2018-05-22 ENCOUNTER — Encounter: Payer: Self-pay | Admitting: Physician Assistant

## 2018-05-23 ENCOUNTER — Other Ambulatory Visit: Payer: Self-pay | Admitting: *Deleted

## 2018-05-23 MED ORDER — CHLORTHALIDONE 25 MG PO TABS: 25.0000 mg | ORAL_TABLET | Freq: Every day | ORAL | 3 refills | Status: DC

## 2018-05-23 MED FILL — CHLORTHALIDONE 25 MG TABS: 25 | 90 days supply | Qty: 90 | Fill #0

## 2018-05-27 ENCOUNTER — Inpatient Hospital Stay (HOSPITAL_BASED_OUTPATIENT_CLINIC_OR_DEPARTMENT_OTHER): Payer: No Typology Code available for payment source | Admitting: Hematology

## 2018-05-27 ENCOUNTER — Encounter (HOSPITAL_COMMUNITY): Payer: Self-pay | Admitting: Hematology

## 2018-05-27 ENCOUNTER — Other Ambulatory Visit: Payer: Self-pay

## 2018-05-27 ENCOUNTER — Ambulatory Visit (HOSPITAL_COMMUNITY): Payer: No Typology Code available for payment source | Admitting: Hematology

## 2018-05-27 VITALS — BP 129/80 | HR 95 | Temp 98.8°F | Resp 16 | Wt 180.5 lb

## 2018-05-27 DIAGNOSIS — Z87891 Personal history of nicotine dependence: Secondary | ICD-10-CM

## 2018-05-27 DIAGNOSIS — D0512 Intraductal carcinoma in situ of left breast: Secondary | ICD-10-CM

## 2018-05-27 NOTE — Progress Notes (Signed)
[place], Sara Freeman [id number]    CLINIC:  Medical Oncology/Hematology  PCP:  Terald Sleeper, PA-C [place] [address] [phone]   REASON FOR VISIT: Follow-up for ductal carcinoma in situ of the left breast, ER+/PR+  CURRENT THERAPY: tamoxifen    CANCER STAGING: Cancer Staging Ductal carcinoma in situ (DCIS) of left breast Staging form: Breast, AJCC 8th Edition - Clinical stage from 10/01/2017: Stage 0 (cTis (DCIS), cN0, cM0, G3, ER: Positive, PR: Positive, HER2: Not assessed ) - Signed by Derek Jack, MD on 10/01/2017    INTERVAL HISTORY:  Sara Freeman 47 y.o. female returns for routine follow-up for DCIS. Patient is doing well and reports no side effects from the medication. She denies any hot flashes. Denies any nausea, vomiting, or diarrhea. Denies any skin rashes or mouth sores. Denies any fevers or recent infections. She reports her appetite and energy level at 100%. She lives at home and performs all her own ADLS and activity.     REVIEW OF SYSTEMS:  Review of Systems  All other systems reviewed and are negative.    PAST MEDICAL/SURGICAL HISTORY:  Past Medical History:  Diagnosis Date  . Allergy   . Breast cancer ([place])   . Breast cancer ([place]) 09/09/2017  . Endometriosis   . Family history of breast cancer   . Hypertension    Past Surgical History:  Procedure Laterality Date  . BREAST BIOPSY Left 2017   benign  . BREAST BIOPSY Left 08/27/2017   Needle core biopsy  . BREAST LUMPECTOMY Left 10/19/2017   Procedure: LEFT BREAST RE EXCISION LUMPECTOMY;  Surgeon: Erroll Luna, MD;  Location: [facility];  Service: General;  Laterality: Left;  . BREAST LUMPECTOMY WITH RADIOACTIVE SEED LOCALIZATION Left 09/22/2017   Procedure: BREAST LUMPECTOMY  WITH RADIOACTIVE SEED LOCALIZATION X 2;  Surgeon: Erroll Luna, MD;  Location: [facility];  Service: General;  Laterality: Left;  . TUBAL LIGATION   1999   [facility]     SOCIAL HISTORY:  Social History   Socioeconomic History  . Marital status: Married    Spouse name: Sara Freeman  . Number of children: 1  . Years of education: 19  . Highest education level: Associate degree: academic program  Occupational History  . 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 Financial resource strain: Not hard at all  . Food insecurity:    Worry: Never true    Inability: Never true  . Transportation needs:    Medical: No    Non-medical: No  Tobacco Use  . Smoking status: Former Smoker    Packs/day: 1.00    Types: Cigarettes    Start date: 07/20/1986    Last attempt to quit: 07/20/2006    Years since quitting: 11.8  . Smokeless tobacco: Never Used  Substance and Sexual Activity  . Alcohol use: No  . Drug use: No  . Sexual activity: Yes    Birth control/protection: Surgical    Comment: tubal  Lifestyle  . Physical activity:    Days per week: 0 days    Minutes per session: 0 min  . Stress: To some extent  Relationships  . Social connections:    Talks on phone: More than three times a week    Gets together: More than three times a week    Attends religious service: More than 4 times per year    Active member  of club or organization: No    Attends meetings of clubs or organizations: Never    Relationship status: Married  . Intimate partner violence:    Fear of current or ex partner: No    Emotionally abused: No    Physically abused: No    Forced sexual activity: No  Other Topics Concern  . Not on file  Social History Narrative   Lives with husband Sara Freeman is at home but spends  Time with his GF   Has 2 dachshund   Likes to read   Goes camping once a month    FAMILY HISTORY:  Family History  Problem Relation Age of Onset  . CAD Other   . Heart disease Other   . Stroke Paternal Grandfather   . Stroke Paternal Grandmother   . Aneurysm Maternal Grandfather   . Heart attack Father   .  Hypertension Father   . COPD Father   . Heart disease Father        37 died  . Thyroid disease Mother   . Hypertension Mother   . Hypertension Brother   . Stroke Paternal Uncle   . Breast cancer Other 60       metastatic    CURRENT MEDICATIONS:  Outpatient Encounter Medications as of 05/27/2018  Medication Sig  . amLODipine (NORVASC) 5 MG tablet Take 1 tablet (5 mg total) by mouth daily.  . calcium-vitamin D 250-100 MG-UNIT tablet Take 1 tablet by mouth 2 (two) times daily. Taking calcium 600 mg plus vitamin D twice daily  . chlorthalidone (HYGROTON) 25 MG tablet Take 1 tablet (25 mg total) by mouth daily.  Marland Kitchen levocetirizine (XYZAL ALLERGY 24HR) 5 MG tablet   . tamoxifen (NOLVADEX) 20 MG tablet Take 1 tablet (20 mg total) by mouth daily.   No facility-administered encounter medications on file as of 05/27/2018.     ALLERGIES:  Allergies  Allergen Reactions  . Lisinopril Cough  . Penicillins Other (See Comments)    UNSPECIFIED REACTION OF CHILDHOOD Has patient had a PCN reaction causing immediate rash, facial/tongue/throat swelling, SOB or lightheadedness with hypotension: Unknown Has patient had a PCN reaction causing severe rash involving mucus membranes or skin necrosis: Unknown Has patient had a PCN reaction that required hospitalization: Unknown Has patient had a PCN reaction occurring within the last 10 years: Unknown If all of the above answers are "NO", then may proceed with Cephalosporin use.   . Azithromycin Itching    Itching of skin without rash     PHYSICAL EXAM:  ECOG Performance status: 1  Vitals:   05/27/18 1333  BP: 129/80  Pulse: 95  Resp: 16  Temp: 98.8 F (37.1 C)  SpO2: 99%   Filed Weights   05/27/18 1333  Weight: 180 lb 8 oz (81.9 kg)    Physical Exam  Constitutional: She is oriented to person, place, and time. She appears well-developed and well-nourished.  Musculoskeletal: Normal range of motion.  Neurological: She is alert and oriented  to person, place, and time.  Skin: Skin is warm and dry.  Psychiatric: She has a normal mood and affect. Her behavior is normal. Judgment and thought content normal.  Breast exam: Left breast lumpectomy site is within normal limits.  No palpable masses.  Right breast has no palpable masses.  No palpable axillary adenopathy. Abdomen: Soft nontender with no palpable abnormality.   LABORATORY DATA:  I have reviewed the labs as listed.  CBC    Component Value  Date/Time   WBC 8.9 05/20/2018 1142   RBC 4.71 05/20/2018 1142   HGB 13.5 05/20/2018 1142   HCT 41.0 05/20/2018 1142   PLT 245 05/20/2018 1142   MCV 87.0 05/20/2018 1142   MCH 28.7 05/20/2018 1142   MCHC 32.9 05/20/2018 1142   RDW 12.2 05/20/2018 1142   LYMPHSABS 2.0 05/20/2018 1142   MONOABS 0.7 05/20/2018 1142   EOSABS 0.1 05/20/2018 1142   BASOSABS 0.1 05/20/2018 1142   CMP Latest Ref Rng & Units 05/20/2018 10/15/2017 09/16/2017  Glucose 70 - 99 mg/dL 104(H) 86 102(H)  BUN 6 - 20 mg/dL '13 10 15  '$ Creatinine 0.44 - 1.00 mg/dL 0.66 0.74 0.81  Sodium 135 - 145 mmol/L 138 137 140  Potassium 3.5 - 5.1 mmol/L 3.3(L) 4.1 3.6  Chloride 98 - 111 mmol/L 102 99(L) 101  CO2 22 - 32 mmol/L '27 29 28  '$ Calcium 8.9 - 10.3 mg/dL 9.1 9.5 9.4  Total Protein 6.5 - 8.1 g/dL 7.5 - -  Total Bilirubin 0.3 - 1.2 mg/dL 0.4 - -  Alkaline Phos 38 - 126 U/L 37(L) - -  AST 15 - 41 U/L 18 - -  ALT 0 - 44 U/L 15 - -         ASSESSMENT & PLAN:   Ductal carcinoma in situ (DCIS) of left breast 1.  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 -She was started on tamoxifen  20 mg daily on 10/29/2017.  She is tolerating it very well. -Her last Pap smear was on 02/18/2018 which was negative. - Her last mammogram was on 08/20/2017.  We will schedule for the next 1. - We went over her blood counts which are within normal limits.  She will come back in 6 months for follow-up. -She will continue calcium and vitamin D supplements.      Orders placed this encounter:  Orders Placed This Encounter  Procedures  . CBC with Differential/Platelet  . Comprehensive metabolic panel      Derek Jack, MD [place] [phone]

## 2018-05-27 NOTE — Assessment & Plan Note (Signed)
1.  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 -She was started on tamoxifen 20 mg daily on 10/29/2017.  She is tolerating it very well. -Her last Pap smear was on 02/18/2018 which was negative. - Her last mammogram was on 08/20/2017.  We will schedule for the next 1. - We went over her blood counts which are within normal limits.  She will come back in 6 months for follow-up. -She will continue calcium and vitamin D supplements.

## 2018-05-27 NOTE — Patient Instructions (Signed)
[facility] at [facility] Discharge Instructions  Follow up in 6 months with labs    Thank you for choosing [facility] at [facility] to provide your oncology and hematology care.  To afford each patient quality time with our provider, please arrive at least 15 minutes before your scheduled appointment time.   If you have a lab appointment with the Cancer Center please come in thru the  Main Entrance and check in at the main information desk  You need to re-schedule your appointment should you arrive 10 or more minutes late.  We strive to give you quality time with our providers, and arriving late affects you and other patients whose appointments are after yours.  Also, if you no show three or more times for appointments you may be dismissed from the clinic at the providers discretion.     Again, thank you for choosing [facility].  Our hope is that these requests will decrease the amount of time that you wait before being seen by our physicians.       _____________________________________________________________  Should you have questions after your visit to  Cancer Center, please contact our office at [phone] between the hours of 8:00 a.m. and 4:30 p.m.  Voicemails left after 4:00 p.m. will not be returned until the following business day.  For prescription refill requests, have your pharmacy contact our office and allow 72 hours.    Cancer Center Support Programs:   > Cancer Support Group  2nd Tuesday of the month 1pm-2pm, Journey Room    

## 2018-07-01 ENCOUNTER — Ambulatory Visit (INDEPENDENT_AMBULATORY_CARE_PROVIDER_SITE_OTHER): Payer: No Typology Code available for payment source | Admitting: Physician Assistant

## 2018-07-01 ENCOUNTER — Encounter: Payer: Self-pay | Admitting: Physician Assistant

## 2018-07-01 VITALS — BP 131/83 | HR 85 | Temp 99.1°F | Ht 63.0 in | Wt 177.6 lb

## 2018-07-01 DIAGNOSIS — E876 Hypokalemia: Secondary | ICD-10-CM

## 2018-07-01 DIAGNOSIS — I10 Essential (primary) hypertension: Secondary | ICD-10-CM

## 2018-07-01 NOTE — Progress Notes (Signed)
BP 131/83   Pulse 85   Temp 99.1 F (37.3 C) (Oral)   Ht 5\' 3"  (1.6 m)   Wt 177 lb 9.6 oz (80.6 kg)   BMI 31.46 kg/m    Subjective:    Patient ID: Sara Freeman, female    DOB: 1970/11/16, 47 y.o.   MRN: [id number]  HPI: Sara Freeman is a 47 y.o. female presenting on 07/01/2018 for Hypertension (6 week follow up )  This patient comes in for follow-up on her hypertension.  She is tolerating the medication very well.  She is not having any difficulty with swelling or any other side effects from the medication.  She has gotten a refill one time.  All of her readings have been very good since she has been on it.  Past Medical History:  Diagnosis Date  . Allergy   . Breast cancer ([facility])   . Breast cancer ([facility]) 09/09/2017  . Endometriosis   . Family history of breast cancer   . Hypertension    Relevant past medical, surgical, family and social history reviewed and updated as indicated. Interim medical history since our last visit reviewed. Allergies and medications reviewed and updated. DATA REVIEWED: CHART IN EPIC  Family History reviewed for pertinent findings.  Review of Systems  Constitutional: Negative.  Negative for activity change, fatigue and fever.  HENT: Negative.   Eyes: Negative.   Respiratory: Negative.  Negative for cough.   Cardiovascular: Negative.  Negative for chest pain.  Gastrointestinal: Negative.  Negative for abdominal pain.  Endocrine: Negative.   Genitourinary: Negative.  Negative for dysuria.  Musculoskeletal: Negative.   Skin: Negative.   Neurological: Negative.     Allergies as of 07/01/2018      Reactions   Lisinopril Cough   Penicillins Other (See Comments)   UNSPECIFIED REACTION OF CHILDHOOD Has patient had a PCN reaction causing immediate rash, facial/tongue/throat swelling, SOB or lightheadedness with hypotension: Unknown Has patient had a PCN reaction causing severe rash involving mucus membranes or skin necrosis: Unknown Has  patient had a PCN reaction that required hospitalization: Unknown Has patient had a PCN reaction occurring within the last 10 years: Unknown If all of the above answers are "NO", then may proceed with Cephalosporin use.   Azithromycin Itching   Itching of skin without rash      Medication List       Accurate as of July 01, 2018  4:45 PM. Always use your most recent med list.        amLODipine 5 MG tablet Commonly known as:  NORVASC Take 1 tablet (5 mg total) by mouth daily.   calcium-vitamin D 250-100 MG-UNIT tablet Take 1 tablet by mouth 2 (two) times daily. Taking calcium 600 mg plus vitamin D twice daily   chlorthalidone 25 MG tablet Commonly known as:  HYGROTON Take 1 tablet (25 mg total) by mouth daily.   tamoxifen 20 MG tablet Commonly known as:  NOLVADEX Take 1 tablet (20 mg total) by mouth daily.   XYZAL ALLERGY 24HR 5 MG tablet Generic drug:  levocetirizine          Objective:    BP 131/83   Pulse 85   Temp 99.1 F (37.3 C) (Oral)   Ht 5\' 3"  (1.6 m)   Wt 177 lb 9.6 oz (80.6 kg)   BMI 31.46 kg/m   Allergies  Allergen Reactions  . Lisinopril Cough  . Penicillins Other (See Comments)    UNSPECIFIED  REACTION OF CHILDHOOD Has patient had a PCN reaction causing immediate rash, facial/tongue/throat swelling, SOB or lightheadedness with hypotension: Unknown Has patient had a PCN reaction causing severe rash involving mucus membranes or skin necrosis: Unknown Has patient had a PCN reaction that required hospitalization: Unknown Has patient had a PCN reaction occurring within the last 10 years: Unknown If all of the above answers are "NO", then may proceed with Cephalosporin use.   . Azithromycin Itching    Itching of skin without rash    Wt Readings from Last 3 Encounters:  07/01/18 177 lb 9.6 oz (80.6 kg)  05/27/18 180 lb 8 oz (81.9 kg)  04/08/18 179 lb (81.2 kg)    Physical Exam Constitutional:      Appearance: She is well-developed.  HENT:       Head: Normocephalic and atraumatic.  Eyes:     Conjunctiva/sclera: Conjunctivae normal.     Pupils: Pupils are equal, round, and reactive to light.  Cardiovascular:     Rate and Rhythm: Normal rate and regular rhythm.     Heart sounds: Normal heart sounds.  Pulmonary:     Effort: Pulmonary effort is normal.     Breath sounds: Normal breath sounds.  Abdominal:     General: Bowel sounds are normal.     Palpations: Abdomen is soft.  Skin:    General: Skin is warm and dry.     Findings: No rash.  Neurological:     Mental Status: She is alert and oriented to person, place, and time.     Deep Tendon Reflexes: Reflexes are normal and symmetric.  Psychiatric:        Behavior: Behavior normal.        Thought Content: Thought content normal.        Judgment: Judgment normal.     Results for orders placed or performed in visit on 05/20/18  CBC with Differential  Result Value Ref Range   WBC 8.9 4.0 - 10.5 K/uL   RBC 4.71 3.87 - 5.11 MIL/uL   Hemoglobin 13.5 12.0 - 15.0 g/dL   HCT 41.0 36.0 - 46.0 %   MCV 87.0 80.0 - 100.0 fL   MCH 28.7 26.0 - 34.0 pg   MCHC 32.9 30.0 - 36.0 g/dL   RDW 12.2 11.5 - 15.5 %   Platelets 245 150 - 400 K/uL   nRBC 0.0 0.0 - 0.2 %   Neutrophils Relative % 68 %   Neutro Abs 6.0 1.7 - 7.7 K/uL   Lymphocytes Relative 22 %   Lymphs Abs 2.0 0.7 - 4.0 K/uL   Monocytes Relative 8 %   Monocytes Absolute 0.7 0.1 - 1.0 K/uL   Eosinophils Relative 1 %   Eosinophils Absolute 0.1 0.0 - 0.5 K/uL   Basophils Relative 1 %   Basophils Absolute 0.1 0.0 - 0.1 K/uL   Immature Granulocytes 0 %   Abs Immature Granulocytes 0.04 0.00 - 0.07 K/uL  Comprehensive metabolic panel  Result Value Ref Range   Sodium 138 135 - 145 mmol/L   Potassium 3.3 (L) 3.5 - 5.1 mmol/L   Chloride 102 98 - 111 mmol/L   CO2 27 22 - 32 mmol/L   Glucose, Bld 104 (H) 70 - 99 mg/dL   BUN 13 6 - 20 mg/dL   Creatinine, Ser 0.66 0.44 - 1.00 mg/dL   Calcium 9.1 8.9 - 10.3 mg/dL   Total  Protein 7.5 6.5 - 8.1 g/dL   Albumin 3.9 3.5 - 5.0 g/dL  AST 18 15 - 41 U/L   ALT 15 0 - 44 U/L   Alkaline Phosphatase 37 (L) 38 - 126 U/L   Total Bilirubin 0.4 0.3 - 1.2 mg/dL   GFR calc non Af Amer >60 >60 mL/min   GFR calc Af Amer >60 >60 mL/min   Anion gap 9 5 - 15      Assessment & Plan:   1. Hypertension, unspecified type Continue amlodipine 5 mg 1 daily  2. Hypokalemia Order to be sent back to her oncologist. - Potassium   Continue all other maintenance medications as listed above.  Follow up plan: Return in about 6 months (around 12/31/2018).  Educational handout given for [place] PA-C [facility] [address]  [place], [place] [address] [phone]   07/01/2018, 4:45 PM

## 2018-07-02 LAB — POTASSIUM: Potassium: 3.1 mmol/L — ABNORMAL LOW (ref 3.5–5.2)

## 2018-07-04 ENCOUNTER — Encounter: Payer: Self-pay | Admitting: Physician Assistant

## 2018-07-04 ENCOUNTER — Other Ambulatory Visit: Payer: Self-pay | Admitting: Physician Assistant

## 2018-07-04 DIAGNOSIS — E876 Hypokalemia: Secondary | ICD-10-CM

## 2018-07-04 MED ORDER — POTASSIUM CHLORIDE ER 10 MEQ PO TBCR: 10.0000 meq | EXTENDED_RELEASE_TABLET | Freq: Every day | ORAL | 5 refills | Status: DC

## 2018-07-04 MED FILL — POTASSIUM CHLORIDE CRYS ER: 10 | 30 days supply | Qty: 30 | Fill #0

## 2018-07-04 MED FILL — AMLODIPINE BESYLATE 5 MG TA: 5 | 90 days supply | Qty: 90 | Fill #1

## 2018-07-18 MED FILL — TAMOXIFEN CITRATE 20 MG TAB: 20 | 90 days supply | Qty: 90 | Fill #3

## 2018-07-29 ENCOUNTER — Other Ambulatory Visit: Payer: No Typology Code available for payment source

## 2018-07-29 DIAGNOSIS — E876 Hypokalemia: Secondary | ICD-10-CM

## 2018-07-30 LAB — BMP8+EGFR
BUN/Creatinine Ratio: 16 (ref 9–23)
BUN: 13 mg/dL (ref 6–24)
CO2: 25 mmol/L (ref 20–29)
Calcium: 9.4 mg/dL (ref 8.7–10.2)
Chloride: 96 mmol/L (ref 96–106)
Creatinine, Ser: 0.79 mg/dL (ref 0.57–1.00)
GFR, EST AFRICAN AMERICAN: 103 mL/min/{1.73_m2} (ref >59)
GFR, EST NON AFRICAN AMERICAN: 89 mL/min/{1.73_m2} (ref >59)
Glucose: 82 mg/dL (ref 65–99)
Potassium: 3.3 mmol/L — ABNORMAL LOW (ref 3.5–5.2)
Sodium: 138 mmol/L (ref 134–144)

## 2018-08-01 ENCOUNTER — Encounter: Payer: Self-pay | Admitting: Physician Assistant

## 2018-08-01 ENCOUNTER — Other Ambulatory Visit: Payer: Self-pay | Admitting: *Deleted

## 2018-08-01 MED ORDER — POTASSIUM CHLORIDE ER 10 MEQ PO TBCR: 10.0000 meq | EXTENDED_RELEASE_TABLET | Freq: Every day | ORAL | 3 refills | Status: DC

## 2018-08-01 MED FILL — POTASSIUM CHLORIDE CRYS ER: 10 | 90 days supply | Qty: 90 | Fill #0

## 2018-08-22 MED FILL — CHLORTHALIDONE 25 MG TABS: 25 | 90 days supply | Qty: 90 | Fill #1 | Status: TO

## 2018-08-26 ENCOUNTER — Ambulatory Visit
Admission: RE | Admit: 2018-08-26 | Discharge: 2018-08-26 | Disposition: A | Discharge status: Home / Self Care | Payer: No Typology Code available for payment source | Source: Ambulatory Visit | Attending: Hematology | Admitting: Hematology

## 2018-08-26 DIAGNOSIS — D0512 Intraductal carcinoma in situ of left breast: Secondary | ICD-10-CM

## 2018-08-26 HISTORY — DX: Personal history of irradiation: Z92.3

## 2018-09-02 ENCOUNTER — Other Ambulatory Visit: Payer: No Typology Code available for payment source

## 2018-09-02 ENCOUNTER — Other Ambulatory Visit: Payer: Self-pay | Admitting: Physician Assistant

## 2018-09-02 DIAGNOSIS — D0512 Intraductal carcinoma in situ of left breast: Secondary | ICD-10-CM

## 2018-09-02 DIAGNOSIS — E876 Hypokalemia: Secondary | ICD-10-CM

## 2018-09-03 LAB — POTASSIUM: Potassium: 3.6 mmol/L (ref 3.5–5.2)

## 2018-09-05 ENCOUNTER — Encounter: Payer: Self-pay | Admitting: Physician Assistant

## 2018-10-03 MED FILL — AMLODIPINE BESYLATE 5 MG TA: 5 | 90 days supply | Qty: 90 | Fill #2

## 2018-10-14 ENCOUNTER — Other Ambulatory Visit (HOSPITAL_COMMUNITY): Payer: Self-pay | Admitting: Hematology

## 2018-10-14 DIAGNOSIS — D0512 Intraductal carcinoma in situ of left breast: Secondary | ICD-10-CM

## 2018-10-14 MED FILL — POTASSIUM CHL ER M10 TABLET: 10 | 90 days supply | Qty: 90 | Fill #0

## 2018-10-14 MED FILL — TAMOXIFEN CITRATE 20 MG TAB: 20 | 90 days supply | Qty: 90 | Fill #0

## 2018-10-19 ENCOUNTER — Encounter: Payer: Self-pay | Admitting: Physician Assistant

## 2018-10-22 ENCOUNTER — Encounter: Payer: Self-pay | Admitting: Emergency Medicine

## 2018-10-22 ENCOUNTER — Other Ambulatory Visit: Payer: Self-pay

## 2018-10-22 ENCOUNTER — Emergency Department
Admission: EM | Admit: 2018-10-22 | Discharge: 2018-10-22 | Disposition: A | Discharge status: Home / Self Care | Payer: No Typology Code available for payment source | Source: Home / Self Care

## 2018-10-22 DIAGNOSIS — L237 Allergic contact dermatitis due to plants, except food: Secondary | ICD-10-CM | POA: Diagnosis not present

## 2018-10-22 MED ORDER — PREDNISONE 50 MG PO TABS: 50.0000 mg | ORAL_TABLET | Freq: Every day | ORAL | 0 refills | Status: AC

## 2018-10-22 MED ORDER — DEXAMETHASONE SODIUM PHOSPHATE 10 MG/ML IJ SOLN
10.0000 mg | Freq: Once | INTRAMUSCULAR | Status: AC
  Administered 2018-10-22: 11:00:00 10 mg via INTRAMUSCULAR

## 2018-10-22 MED ORDER — TRIAMCINOLONE ACETONIDE 0.1 % EX CREA: 1.0000 "application " | TOPICAL_CREAM | Freq: Two times a day (BID) | CUTANEOUS | 0 refills | Status: DC

## 2018-10-22 NOTE — Discharge Instructions (Addendum)
°  You were given a shot of decadron (a steroid) today to help with itching and swelling from a likely allergic reaction.  You have been prescribed 4 days of prednisone, an oral steroid.  You may start this medication tomorrow with breakfast.    Please follow up with family medicine in 1 week if not improving, sooner if significantly worsening.

## 2018-10-22 NOTE — ED Provider Notes (Signed)
[facility]    CSN: [id number] Arrival date & time: 10/22/18  1050     History   Chief Complaint Chief Complaint  Patient presents with  . Rash    HPI Sara Freeman is a 48 y.o. female.   HPI Sara Freeman is a 48 y.o. female presenting to UC with c/o gradually worsening erythematous pruritic rash that started on both wrists 6 days ago after laying out pine needles 7 days ago.  Rash has spread across her Right forearm, on her forehead, Right cheek, Left side of neck, lower abdomen and bilateral legs. No relief with OTC creams. Denies new soaps, lotions or medications. The last time she had poison ivy or poison oak was over 30 years ago. Denies pain to the rash. No fever.   Past Medical History:  Diagnosis Date  . Allergy   . Breast cancer ([facility])   . Breast cancer ([facility]) 09/09/2017  . Endometriosis   . Family history of breast cancer   . Hypertension   . Personal history of radiation therapy     Patient Active Problem List   Diagnosis Date Noted  . 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 Genetic testing 09/10/2017  . Breast cancer ([facility]) 09/09/2017  . Family history of breast cancer   . Family history of heart disease in female family member before age 25 05/28/2017  . Endometriosis 11/23/2012  . HTN (hypertension) 11/23/2012    Past Surgical History:  Procedure Laterality Date  . BREAST BIOPSY Left 2017   benign  . BREAST BIOPSY Left 08/27/2017   Needle core biopsy  . BREAST LUMPECTOMY Left 10/19/2017   Procedure: LEFT BREAST RE EXCISION LUMPECTOMY;  Surgeon: Erroll Luna, MD;  Location: [facility];  Service: General;  Laterality: Left;  . BREAST LUMPECTOMY WITH RADIOACTIVE SEED LOCALIZATION Left 09/22/2017   Procedure: BREAST LUMPECTOMY  WITH RADIOACTIVE SEED LOCALIZATION X 2;  Surgeon: Erroll Luna, MD;  Location: [facility];  Service: General;  Laterality: Left;  . TUBAL LIGATION  1999   [facility]    OB History    Gravida  1   Para  1   Term  1   Preterm      AB      Living  1     SAB      TAB      Ectopic      Multiple      Live Births  1            Home Medications    Prior to Admission medications   Medication Sig Start Date End Date Taking? Authorizing Provider  amLODipine (NORVASC) 5 MG tablet Take 1 tablet (5 mg total) by mouth daily. 04/08/18   Terald Sleeper, PA-C  calcium-vitamin D 250-100 MG-UNIT tablet Take 1 tablet by mouth 2 (two) times daily. Taking calcium 600 mg plus vitamin D twice daily    [provider]  chlorthalidone (HYGROTON) 25 MG tablet Take 1 tablet (25 mg total) by mouth daily. 05/23/18   Terald Sleeper, PA-C  levocetirizine Harlow Ohms ALLERGY 24HR) 5 MG tablet  02/13/18   [provider]  potassium chloride (K-DUR) 10 MEQ tablet Take 1 tablet (10 mEq total) by mouth daily. 08/01/18   Terald Sleeper, PA-C  predniSONE (DELTASONE) 50 MG tablet Take 1 tablet (50 mg total) by mouth daily with breakfast for 4 days. 10/22/18 10/26/18  Noe Gens, PA-C  tamoxifen (  NOLVADEX) 20 MG tablet TAKE 1 TABLET BY MOUTH DAILY. 10/14/18   Derek Jack, MD  triamcinolone cream (KENALOG) 0.1 % Apply 1 application topically 2 (two) times daily. 10/22/18   Noe Gens, PA-C    Family History Family History  Problem Relation Age of Onset  . CAD Other   . Heart disease Other   . Stroke Paternal Grandfather   . Stroke Paternal Grandmother   . Aneurysm Maternal Grandfather   . Heart attack Father   . Hypertension Father   . COPD Father   . Heart disease Father        76 died  . Thyroid disease Mother   . Hypertension Mother   . Hypertension Brother   . Stroke Paternal Uncle   . Breast cancer Other 51       metastatic    Social History Social History   Tobacco Use  . Smoking status: Former Smoker    Packs/day: 1.00    Types: Cigarettes    Start date: 07/20/1986    Last attempt to quit: 07/20/2006    Years since quitting: 12.2  . Smokeless  tobacco: Never Used  Substance Use Topics  . Alcohol use: No  . Drug use: No     Allergies   Lisinopril; Penicillins; and Azithromycin   Review of Systems Review of Systems  Musculoskeletal: Negative for arthralgias, joint swelling and myalgias.  Skin: Positive for rash. Negative for wound.     Physical Exam Triage Vital Signs ED Triage Vitals  Enc Vitals Group     BP      Pulse      Resp      Temp      Temp src      SpO2      Weight      Height      Head Circumference      Peak Flow      Pain Score      Pain Loc      Pain Edu?      Excl. in [place]?    No data found.  Updated Vital Signs BP 133/81 (BP Location: Right Arm)   Pulse (!) 105   Temp 98.9 F (37.2 C) (Oral)   Ht 5\' 3"  (1.6 m)   Wt 177 lb 4 oz (80.4 kg)   SpO2 100%   BMI 31.40 kg/m   Visual Acuity Right Eye Distance:   Left Eye Distance:   Bilateral Distance:    Right Eye Near:   Left Eye Near:    Bilateral Near:     Physical Exam Vitals signs and nursing note reviewed.  Constitutional:      Appearance: Normal appearance. She is well-developed.  HENT:     Head: Normocephalic and atraumatic.  Neck:     Musculoskeletal: Normal range of motion.  Cardiovascular:     Rate and Rhythm: Normal rate.  Pulmonary:     Effort: Pulmonary effort is normal.  Musculoskeletal: Normal range of motion.  Skin:    General: Skin is warm and dry.     Findings: Erythema and rash present.          Comments: Diffuse erythematous maculopapular rash, linear distributions. Non-tender. No active discharge.  Neurological:     Mental Status: She is alert and oriented to person, place, and time.  Psychiatric:        Behavior: Behavior normal.      UC Treatments / Results  Labs (all labs ordered  are listed, but only abnormal results are displayed) Labs Reviewed - No data to display  EKG None  Radiology No results found.  Procedures Procedures (including critical care time)  Medications Ordered  in UC Medications  dexamethasone (DECADRON) injection 10 mg (10 mg Intramuscular Given 10/22/18 1116)    Initial Impression / Assessment and Plan / UC Course  I have reviewed the triage vital signs and the nursing notes.  Pertinent labs & imaging results that were available during my care of the patient were reviewed by me and considered in my medical decision making (see chart for details).     Hx and exam c/w allergic reaction to pine needles/plants. Will tx with steroids No evidence of underlying bacterial infection Home care info provided  Final Clinical Impressions(s) / UC Diagnoses   Final diagnoses:  Allergic contact dermatitis due to plants, except food     Discharge Instructions      You were given a shot of decadron (a steroid) today to help with itching and swelling from a likely allergic reaction.  You have been prescribed 4 days of prednisone, an oral steroid.  You may start this medication tomorrow with breakfast.    Please follow up with family medicine in 1 week if not improving, sooner if significantly worsening.      ED Prescriptions    Medication Sig Dispense Auth. Provider   triamcinolone cream (KENALOG) 0.1 % Apply 1 application topically 2 (two) times daily. 30 g Gerarda Fraction, Biana Haggar O, PA-C   predniSONE (DELTASONE) 50 MG tablet Take 1 tablet (50 mg total) by mouth daily with breakfast for 4 days. 4 tablet Noe Gens, PA-C     Controlled Substance Prescriptions [place] Controlled Substance Registry consulted? Not Applicable   Tyrell Antonio 10/22/18 1129

## 2018-10-22 NOTE — ED Triage Notes (Signed)
Patient c/o possible poison ivy on arms and ankles since Monday.

## 2018-11-14 MED FILL — CHLORTHALIDONE 25 MG TABS: 25 | 90 days supply | Qty: 90 | Fill #0

## 2018-11-18 ENCOUNTER — Encounter (HOSPITAL_COMMUNITY): Payer: Self-pay

## 2018-11-25 ENCOUNTER — Other Ambulatory Visit (HOSPITAL_COMMUNITY): Payer: No Typology Code available for payment source

## 2018-12-02 ENCOUNTER — Ambulatory Visit (HOSPITAL_COMMUNITY): Payer: No Typology Code available for payment source | Admitting: Hematology

## 2018-12-22 ENCOUNTER — Other Ambulatory Visit: Payer: Self-pay

## 2018-12-23 ENCOUNTER — Encounter: Payer: Self-pay | Admitting: Physician Assistant

## 2018-12-23 ENCOUNTER — Ambulatory Visit (INDEPENDENT_AMBULATORY_CARE_PROVIDER_SITE_OTHER): Payer: No Typology Code available for payment source | Admitting: Physician Assistant

## 2018-12-23 VITALS — BP 130/85 | HR 91 | Temp 98.7°F | Ht 63.0 in | Wt 178.0 lb

## 2018-12-23 DIAGNOSIS — I10 Essential (primary) hypertension: Secondary | ICD-10-CM | POA: Diagnosis not present

## 2018-12-23 DIAGNOSIS — E876 Hypokalemia: Secondary | ICD-10-CM | POA: Diagnosis not present

## 2018-12-23 MED ORDER — OLMESARTAN MEDOXOMIL-HCTZ 20-12.5 MG PO TABS: 1.0000 | ORAL_TABLET | Freq: Every day | ORAL | 5 refills | Status: DC

## 2018-12-23 MED FILL — OLMESARTAN-HCTZ 20-12.5 MG: 20-12.5 | 30 days supply | Qty: 30 | Fill #0

## 2018-12-27 ENCOUNTER — Encounter: Payer: Self-pay | Admitting: Physician Assistant

## 2018-12-27 NOTE — Progress Notes (Signed)
BP 130/85   Pulse 91   Temp 98.7 F (37.1 C) (Oral)   Ht '5\' 3"'$  (1.6 m)   Wt 178 lb (80.7 kg)   BMI 31.53 kg/m    Subjective:    Patient ID: Sara Freeman, female    DOB: 1971/07/05, 48 y.o.   MRN: [id number]  HPI: Sara Freeman is a 48 y.o. female presenting on 12/23/2018 for Medical Management of Chronic Issues  Patient comes in for recheck on her chronic conditions which do include hypertension and hypokalemia.  She is still having periodic checks with her oncologist.  She has to go every 6 months now.  She reports that overall she is doing very well.  She would like to have a blood pressure medicine that is combined in 1.  She is currently on amlodipine and chlorthalidone separately.  Norvasc does not come with a combination of fluid pill alone.  So we can try Benicar which has a combination option with hydrochlorothiazide.    Past Medical History:  Diagnosis Date  . Allergy   . Breast cancer ([facility])   . Breast cancer ([facility]) 09/09/2017  . Endometriosis   . Family history of breast cancer   . Hypertension   . Personal history of radiation therapy    Relevant past medical, surgical, family and social history reviewed and updated as indicated. Interim medical history since our last visit reviewed. Allergies and medications reviewed and updated. DATA REVIEWED: CHART IN EPIC  Family History reviewed for pertinent findings.  Review of Systems  Constitutional: Negative.   HENT: Negative.   Eyes: Negative.   Respiratory: Negative.   Gastrointestinal: Negative.   Genitourinary: Negative.     Allergies as of 12/23/2018      Reactions   Lisinopril Cough   Penicillins Other (See Comments)   UNSPECIFIED REACTION OF CHILDHOOD Has patient had a PCN reaction causing immediate rash, facial/tongue/throat swelling, SOB or lightheadedness with hypotension: Unknown Has patient had a PCN reaction causing severe rash involving mucus membranes or skin necrosis: Unknown Has patient had a PCN  reaction that required hospitalization: Unknown Has patient had a PCN reaction occurring within the last 10 years: Unknown If all of the above answers are "NO", then may proceed with Cephalosporin use.   Azithromycin Itching   Itching of skin without rash      Medication List       Accurate as of December 23, 2018 11:59 PM. If you have any questions, ask your nurse or doctor.        STOP taking these medications   amLODipine 5 MG tablet Commonly known as:  NORVASC Stopped by:  Terald Sleeper, PA-C   chlorthalidone 25 MG tablet Commonly known as:  HYGROTON Stopped by:  Terald Sleeper, PA-C   triamcinolone cream 0.1 % Commonly known as:  KENALOG Stopped by:  Terald Sleeper, PA-C   Xyzal Allergy 24HR 5 MG tablet Generic drug:  levocetirizine Stopped by:  Terald Sleeper, PA-C     TAKE these medications   calcium-vitamin D 250-100 MG-UNIT tablet Take 1 tablet by mouth 2 (two) times daily. Taking calcium 600 mg plus vitamin D twice daily   olmesartan-hydrochlorothiazide 20-12.5 MG tablet Commonly known as:  BENICAR HCT Take 1 tablet by mouth daily. Started by:  Terald Sleeper, PA-C   potassium chloride 10 MEQ tablet Commonly known as:  K-DUR Take 1 tablet (10 mEq total) by mouth daily.   tamoxifen 20 MG tablet Commonly  known as:  NOLVADEX TAKE 1 TABLET BY MOUTH DAILY.          Objective:    BP 130/85   Pulse 91   Temp 98.7 F (37.1 C) (Oral)   Ht '5\' 3"'$  (1.6 m)   Wt 178 lb (80.7 kg)   BMI 31.53 kg/m   Allergies  Allergen Reactions  . Lisinopril Cough  . Penicillins Other (See Comments)    UNSPECIFIED REACTION OF CHILDHOOD Has patient had a PCN reaction causing immediate rash, facial/tongue/throat swelling, SOB or lightheadedness with hypotension: Unknown Has patient had a PCN reaction causing severe rash involving mucus membranes or skin necrosis: Unknown Has patient had a PCN reaction that required hospitalization: Unknown Has patient had a PCN reaction  occurring within the last 10 years: Unknown If all of the above answers are "NO", then may proceed with Cephalosporin use.   . Azithromycin Itching    Itching of skin without rash    Wt Readings from Last 3 Encounters:  12/23/18 178 lb (80.7 kg)  10/22/18 177 lb 4 oz (80.4 kg)  07/01/18 177 lb 9.6 oz (80.6 kg)    Physical Exam Constitutional:      Appearance: She is well-developed.  HENT:     Head: Normocephalic and atraumatic.  Eyes:     Conjunctiva/sclera: Conjunctivae normal.     Pupils: Pupils are equal, round, and reactive to light.  Cardiovascular:     Rate and Rhythm: Normal rate and regular rhythm.     Heart sounds: Normal heart sounds.  Pulmonary:     Effort: Pulmonary effort is normal.     Breath sounds: Normal breath sounds.  Abdominal:     General: Bowel sounds are normal.     Palpations: Abdomen is soft.  Skin:    General: Skin is warm and dry.     Findings: No rash.  Neurological:     Mental Status: She is alert and oriented to person, place, and time.     Deep Tendon Reflexes: Reflexes are normal and symmetric.  Psychiatric:        Behavior: Behavior normal.        Thought Content: Thought content normal.        Judgment: Judgment normal.     Results for orders placed or performed in visit on 09/02/18  Potassium  Result Value Ref Range   Potassium 3.6 3.5 - 5.2 mmol/L      Assessment & Plan:   1. Hypertension, unspecified type New blood pressure medicine, in order to have 1 medication we have switched her from amlodipine and chlorthalidone to this. - olmesartan-hydrochlorothiazide (BENICAR HCT) 20-12.5 MG tablet; Take 1 tablet by mouth daily.  Dispense: 30 tablet; Refill: 5 - Lipid panel; Future - CMP14+EGFR; Future - CBC with Differential/Platelet; Future  2. Hypokalemia - Lipid panel; Future - CMP14+EGFR; Future - CBC with Differential/Platelet; Future   Continue all other maintenance medications as listed above.  Follow up plan:  Return in about 3 months (around 03/25/2019).  Educational handout given for [place] PA-C [facility] [address]  [place], [place] [address] [phone]   12/27/2018, 8:45 AM

## 2018-12-30 ENCOUNTER — Ambulatory Visit: Payer: No Typology Code available for payment source | Admitting: Physician Assistant

## 2019-01-13 ENCOUNTER — Other Ambulatory Visit: Payer: Self-pay

## 2019-01-13 ENCOUNTER — Other Ambulatory Visit: Payer: No Typology Code available for payment source

## 2019-01-13 DIAGNOSIS — E876 Hypokalemia: Secondary | ICD-10-CM

## 2019-01-13 DIAGNOSIS — I10 Essential (primary) hypertension: Secondary | ICD-10-CM

## 2019-01-13 LAB — LIPID PANEL

## 2019-01-14 LAB — CMP14+EGFR
ALT: 9 IU/L (ref 0–32)
AST: 15 IU/L (ref 0–40)
Albumin/Globulin Ratio: 1.9 (ref 1.2–2.2)
Albumin: 4.1 g/dL (ref 3.8–4.8)
Alkaline Phosphatase: 44 IU/L (ref 39–117)
BUN/Creatinine Ratio: 15 (ref 9–23)
BUN: 13 mg/dL (ref 6–24)
Bilirubin Total: 0.3 mg/dL (ref 0.0–1.2)
CO2: 24 mmol/L (ref 20–29)
Calcium: 9.3 mg/dL (ref 8.7–10.2)
Chloride: 102 mmol/L (ref 96–106)
Creatinine, Ser: 0.84 mg/dL (ref 0.57–1.00)
GFR calc Af Amer: 96 mL/min/{1.73_m2} (ref >59)
GFR calc non Af Amer: 83 mL/min/{1.73_m2} (ref >59)
Globulin, Total: 2.2 g/dL (ref 1.5–4.5)
Glucose: 87 mg/dL (ref 65–99)
Potassium: 4.2 mmol/L (ref 3.5–5.2)
Sodium: 139 mmol/L (ref 134–144)
Total Protein: 6.3 g/dL (ref 6.0–8.5)

## 2019-01-14 LAB — CBC WITH DIFFERENTIAL/PLATELET
Basophils Absolute: 0.1 10*3/uL (ref 0.0–0.2)
Basos: 1 %
EOS (ABSOLUTE): 0.1 10*3/uL (ref 0.0–0.4)
Eos: 2 %
Hematocrit: 39.9 % (ref 34.0–46.6)
Hemoglobin: 13.2 g/dL (ref 11.1–15.9)
Immature Grans (Abs): 0 10*3/uL (ref 0.0–0.1)
Immature Granulocytes: 0 %
Lymphocytes Absolute: 1.7 10*3/uL (ref 0.7–3.1)
Lymphs: 25 %
MCH: 28.5 pg (ref 26.6–33.0)
MCHC: 33.1 g/dL (ref 31.5–35.7)
MCV: 86 fL (ref 79–97)
Monocytes Absolute: 0.5 10*3/uL (ref 0.1–0.9)
Monocytes: 7 %
Neutrophils Absolute: 4.6 10*3/uL (ref 1.4–7.0)
Neutrophils: 65 %
Platelets: 224 10*3/uL (ref 150–450)
RBC: 4.63 x10E6/uL (ref 3.77–5.28)
RDW: 12.5 % (ref 11.7–15.4)
WBC: 7.1 10*3/uL (ref 3.4–10.8)

## 2019-01-14 LAB — LIPID PANEL
Chol/HDL Ratio: 2.8 ratio (ref 0.0–4.4)
Cholesterol, Total: 188 mg/dL (ref 100–199)
HDL: 66 mg/dL (ref >39)
LDL Calculated: 94 mg/dL (ref 0–99)
Triglycerides: 140 mg/dL (ref 0–149)
VLDL Cholesterol Cal: 28 mg/dL (ref 5–40)

## 2019-01-16 ENCOUNTER — Telehealth: Payer: Self-pay | Admitting: *Deleted

## 2019-01-16 DIAGNOSIS — I10 Essential (primary) hypertension: Secondary | ICD-10-CM

## 2019-01-16 MED ORDER — OLMESARTAN MEDOXOMIL-HCTZ 20-12.5 MG PO TABS: 1.0000 | ORAL_TABLET | Freq: Every day | ORAL | 1 refills | Status: DC

## 2019-01-16 MED FILL — OLMESARTAN-HCTZ 20-12.5 MG: 20-12.5 | 30 days supply | Qty: 30 | Fill #0

## 2019-01-16 MED FILL — TAMOXIFEN CITRATE 20 MG TAB: 20 | 90 days supply | Qty: 90 | Fill #1

## 2019-01-16 NOTE — Telephone Encounter (Signed)
-----   Message from Terald Sleeper, PA-C sent at 01/16/2019 11:55 AM EDT ----- All of your labs look very good and can be rechecked on the normal interval.

## 2019-01-16 NOTE — Telephone Encounter (Signed)
Pt notified of results Verbalizes understanding 

## 2019-01-23 MED FILL — POTASSIUM CHL ER M10 TABLET: 10 | 90 days supply | Qty: 90 | Fill #1

## 2019-01-27 ENCOUNTER — Inpatient Hospital Stay (HOSPITAL_COMMUNITY): Payer: No Typology Code available for payment source | Attending: Hematology | Admitting: Nurse Practitioner

## 2019-01-27 ENCOUNTER — Ambulatory Visit (HOSPITAL_COMMUNITY): Payer: No Typology Code available for payment source | Admitting: Hematology

## 2019-01-27 ENCOUNTER — Other Ambulatory Visit: Payer: Self-pay

## 2019-01-27 DIAGNOSIS — N951 Menopausal and female climacteric states: Secondary | ICD-10-CM

## 2019-01-27 DIAGNOSIS — Z170 Estrogen receptor positive status [ER+]: Secondary | ICD-10-CM

## 2019-01-27 DIAGNOSIS — D0512 Intraductal carcinoma in situ of left breast: Secondary | ICD-10-CM | POA: Diagnosis present

## 2019-01-27 DIAGNOSIS — Z87891 Personal history of nicotine dependence: Secondary | ICD-10-CM

## 2019-01-27 NOTE — Patient Instructions (Addendum)
[name]  Discharge Instructions: Follow up in 7 months with mammogram and labs  You saw Francene Finders, NP today. _______________________________________________________________  Thank you for choosing [facility] to provide your oncology and hematology care.  To afford each patient quality time with our providers, please arrive at least 15 minutes before your scheduled appointment.  You need to re-schedule your appointment if you arrive 10 or more minutes late.  We strive to give you quality time with our providers, and arriving late affects you and other patients whose appointments are after yours.  Also, if you no show three or more times for appointments you may be dismissed from the clinic.  Again, thank you for choosing [facility] hope is that these requests will allow you access to exceptional care and in a timely manner. _______________________________________________________________  If you have questions after your visit, please contact our office at [phone] between the hours of 8:30 a.m. and 5:00 p.m. Voicemails left after 4:30 p.m. will not be returned until the following business day. _______________________________________________________________  For prescription refill requests, have your pharmacy contact our office. _______________________________________________________________  Recommendations made by the consultant and any test results will be sent to your referring physician. _______________________________________________________________

## 2019-01-27 NOTE — Assessment & Plan Note (Signed)
1.  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 -Status post lumpectomy on 09/22/2017, with positive inferior margins and other margins close. - Status post reexcision of lumpectomy margins on 10/19/2017, pathology showing no evidence of DCIS or invasive cancer. -Given high-grade DCIS and young age, it was recommended she have antiestrogen therapy with tamoxifen for 5 years. -She started tamoxifen 20 mg daily on 10/29/2017.  She is tolerating well. -Her last Pap smear was on 02/18/2018 which was negative. -Her last mammogram was on 08/26/2018 which was BI-RADS Category 2 benign. -Labs on 01/13/2019 showed her potassium 4.2, creatinine 0.84, WBC 7.1, hemoglobin 13.2, platelets 224. - She will continue taking calcium and vitamin D daily. -She will follow-up in 7 months with repeat mammogram and labs.

## 2019-01-27 NOTE — Addendum Note (Signed)
Addended by: [place]ie [place] on: 01/27/2019 09:55 AM   Modules accepted: Orders

## 2019-01-27 NOTE — Progress Notes (Signed)
Sara Freeman, [place] [id number]   CLINIC:  Medical Oncology/Hematology  PCP:  Sara Sleeper, PA-C [place] [address] [phone]   REASON FOR VISIT: Follow-up for DCIS  CURRENT THERAPY: Tamoxifen   CANCER STAGING: Cancer Staging Ductal carcinoma in situ (DCIS) of left breast Staging form: Breast, AJCC 8th Edition - Clinical stage from 10/01/2017: Stage 0 (cTis (DCIS), cN0, cM0, G3, ER: Positive, PR: Positive, HER2: Not assessed ) - Signed by Sara Jack, MD on 10/01/2017    INTERVAL HISTORY:  Sara Freeman 48 y.o. female returns for routine follow-up DCIS.  She reports she does have occasional hot flashes but they are once every week or 2.  She has no other complaints at this time. Denies any nausea, vomiting, or diarrhea. Denies any new pains. Had not noticed any recent bleeding such as epistaxis, hematuria or hematochezia. Denies recent chest pain on exertion, shortness of breath on minimal exertion, pre-syncopal episodes, or palpitations. Denies any numbness or tingling in hands or feet. Denies any recent fevers, infections, or recent hospitalizations. Patient reports appetite at 100% and energy level at 100%.  She is eating well maintaining her weight at this time.    REVIEW OF SYSTEMS:  Review of Systems  Endocrine: Positive for hot flashes.  All other systems reviewed and are negative.    PAST MEDICAL/SURGICAL HISTORY:  Past Medical History:  Diagnosis Date  . Allergy   . Breast cancer ([facility])   . Breast cancer ([facility]) 09/09/2017  . Endometriosis   . Family history of breast cancer   . Hypertension   . Personal history of radiation therapy    Past Surgical History:  Procedure Laterality Date  . BREAST BIOPSY Left 2017   benign  . BREAST BIOPSY Left 08/27/2017   Needle core biopsy  . BREAST LUMPECTOMY Left 10/19/2017   Procedure: LEFT BREAST RE EXCISION LUMPECTOMY;  Surgeon: Erroll Luna, MD;  Location:  [facility];  Service: General;  Laterality: Left;  . BREAST LUMPECTOMY WITH RADIOACTIVE SEED LOCALIZATION Left 09/22/2017   Procedure: BREAST LUMPECTOMY  WITH RADIOACTIVE SEED LOCALIZATION X 2;  Surgeon: Erroll Luna, MD;  Location: [facility];  Service: General;  Laterality: Left;  . TUBAL LIGATION  1999   [facility]     SOCIAL HISTORY:  Social History   Socioeconomic History  . Marital status: Married    Spouse name: Sara Freeman  . Number of children: 1  . Years of education: 52  . Highest education level: Associate degree: academic program  Occupational History  . 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 Financial resource strain: Not hard at all  . Food insecurity    Worry: Never true    Inability: Never true  . Transportation needs    Medical: No    Non-medical: No  Tobacco Use  . Smoking status: Former Smoker    Packs/day: 1.00    Types: Cigarettes    Start date: 07/20/1986    Quit date: 07/20/2006    Years since quitting: 12.5  . Smokeless tobacco: Never Used  Substance and Sexual Activity  . Alcohol use: No  . Drug use: No  . Sexual activity: Yes    Birth control/protection: Surgical    Comment: tubal  Lifestyle  . Physical activity    Days per week: 0 days    Minutes per session: 0 min  . Stress: To some extent  Relationships  . Social connections    Talks on phone: More than three times a week    Gets together: More than three times a week    Attends religious service: More than 4 times per year    Active member of club or organization: No    Attends meetings of clubs or organizations: Never    Relationship status: Married  . Intimate partner violence    Fear of current or ex partner: No    Emotionally abused: No    Physically abused: No    Forced sexual activity: No  Other Topics Concern  . Not on file  Social History Narrative   Lives with husband Sara Freeman is at home but spends  Time with his GF   Has 2  dachshund   Likes to read   Goes camping once a month    FAMILY HISTORY:  Family History  Problem Relation Age of Onset  . CAD Other   . Heart disease Other   . Stroke Paternal Grandfather   . Stroke Paternal Grandmother   . Aneurysm Maternal Grandfather   . Heart attack Father   . Hypertension Father   . COPD Father   . Heart disease Father        59 died  . Thyroid disease Mother   . Hypertension Mother   . Hypertension Brother   . Stroke Paternal Uncle   . Breast cancer Other 60       metastatic    CURRENT MEDICATIONS:  Outpatient Encounter Medications as of 01/27/2019  Medication Sig  . calcium-vitamin D 250-100 MG-UNIT tablet Take 1 tablet by mouth daily. Taking calcium 600 mg plus vitamin D once daily  . loratadine (CLARITIN) 10 MG tablet Take 10 mg by mouth daily.  Marland Kitchen olmesartan-hydrochlorothiazide (BENICAR HCT) 20-12.5 MG tablet Take 1 tablet by mouth daily.  . potassium chloride (K-DUR) 10 MEQ tablet Take 1 tablet (10 mEq total) by mouth daily.  . tamoxifen (NOLVADEX) 20 MG tablet TAKE 1 TABLET BY MOUTH DAILY.   No facility-administered encounter medications on file as of 01/27/2019.     ALLERGIES:  Allergies  Allergen Reactions  . Lisinopril Cough  . Penicillins Other (See Comments)    UNSPECIFIED REACTION OF CHILDHOOD Has patient had a PCN reaction causing immediate rash, facial/tongue/throat swelling, SOB or lightheadedness with hypotension: Unknown Has patient had a PCN reaction causing severe rash involving mucus membranes or skin necrosis: Unknown Has patient had a PCN reaction that required hospitalization: Unknown Has patient had a PCN reaction occurring within the last 10 years: Unknown If all of the above answers are "NO", then may proceed with Cephalosporin use.   . Azithromycin Itching    Itching of skin without rash     PHYSICAL EXAM:  ECOG Performance status: 1  Vitals:   01/27/19 0920  BP: 130/70  Pulse: (!) 102  Resp: 16  Temp: 98.6  F (37 C)  SpO2: 100%   Filed Weights   01/27/19 0920  Weight: 181 lb 11.2 oz (82.4 kg)    Physical Exam Constitutional:      Appearance: Normal appearance. She is normal weight.  Cardiovascular:     Rate and Rhythm: Normal rate and regular rhythm.     Heart sounds: Normal heart sounds.  Pulmonary:     Effort: Pulmonary effort is normal.     Breath sounds: Normal breath sounds.  Abdominal:     General: Bowel sounds are normal.  Palpations: Abdomen is soft.  Musculoskeletal: Normal range of motion.  Skin:    General: Skin is warm and dry.  Neurological:     Mental Status: She is alert and oriented to person, place, and time. Mental status is at baseline.  Psychiatric:        Mood and Affect: Mood normal.        Behavior: Behavior normal.        Thought Content: Thought content normal.   Breast: No palpable masses, no skin changes or nipple discharge, no adenopathy.   LABORATORY DATA:  I have reviewed the labs as listed.  CBC    Component Value Date/Time   WBC 7.1 01/13/2019 0927   WBC 8.9 05/20/2018 1142   RBC 4.63 01/13/2019 0927   RBC 4.71 05/20/2018 1142   HGB 13.2 01/13/2019 0927   HCT 39.9 01/13/2019 0927   PLT 224 01/13/2019 0927   MCV 86 01/13/2019 0927   MCH 28.5 01/13/2019 0927   MCH 28.7 05/20/2018 1142   MCHC 33.1 01/13/2019 0927   MCHC 32.9 05/20/2018 1142   RDW 12.5 01/13/2019 0927   LYMPHSABS 1.7 01/13/2019 0927   MONOABS 0.7 05/20/2018 1142   EOSABS 0.1 01/13/2019 0927   BASOSABS 0.1 01/13/2019 0927   CMP Latest Ref Rng & Units 01/13/2019 09/02/2018 07/29/2018  Glucose 65 - 99 mg/dL 87 - 82  BUN 6 - 24 mg/dL 13 - 13  Creatinine 0.57 - 1.00 mg/dL 0.84 - 0.79  Sodium 134 - 144 mmol/L 139 - 138  Potassium 3.5 - 5.2 mmol/L 4.2 3.6 3.3(L)  Chloride 96 - 106 mmol/L 102 - 96  CO2 20 - 29 mmol/L 24 - 25  Calcium 8.7 - 10.2 mg/dL 9.3 - 9.4  Total Protein 6.0 - 8.5 g/dL 6.3 - -  Total Bilirubin 0.0 - 1.2 mg/dL 0.3 - -  Alkaline Phos 39 - 117  IU/L 44 - -  AST 0 - 40 IU/L 15 - -  ALT 0 - 32 IU/L 9 - -       DIAGNOSTIC IMAGING:  I have independently reviewed the mammogram and discussed with the patient.   I personally performed a face-to-face visit.  All questions were answered to patient's stated satisfaction. Encouraged patient to call with any new concerns or questions before his next visit to the cancer center and we can certain see him sooner, if needed.     ASSESSMENT & PLAN:   Ductal carcinoma in situ (DCIS) of left breast 1.  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 -Status post lumpectomy on 09/22/2017, with positive inferior margins and other margins close. - Status post reexcision of lumpectomy margins on 10/19/2017, pathology showing no evidence of DCIS or invasive cancer. -Given high-grade DCIS and young age, it was recommended she have antiestrogen therapy with tamoxifen for 5 years. -She started tamoxifen 20 mg daily on 10/29/2017.  She is tolerating well. -Her last Pap smear was on 02/18/2018 which was negative. -Her last mammogram was on 08/26/2018 which was BI-RADS Category 2 benign. -Labs on 01/13/2019 showed her potassium 4.2, creatinine 0.84, WBC 7.1, hemoglobin 13.2, platelets 224. - She will continue taking calcium and vitamin D daily. -She will follow-up in 7 months with repeat mammogram and labs.      Orders placed this encounter:  Orders Placed This Encounter  Procedures  . Lactate dehydrogenase  . CBC with Differential/Platelet  . Comprehensive metabolic panel  .  Vitamin B12  . VITAMIN D 25 Hydroxy (Vit-D Deficiency, Fractures)      Francene Finders, FNP-C [place] [phone]

## 2019-02-12 ENCOUNTER — Encounter: Payer: Self-pay | Admitting: Physician Assistant

## 2019-02-12 DIAGNOSIS — I10 Essential (primary) hypertension: Secondary | ICD-10-CM

## 2019-02-13 MED ORDER — OLMESARTAN MEDOXOMIL-HCTZ 20-12.5 MG PO TABS: 1.0000 | ORAL_TABLET | Freq: Every day | ORAL | 1 refills | Status: DC

## 2019-02-13 MED FILL — OLMESARTAN-HCTZ 20-12.5 MG: 20-12.5 | 30 days supply | Qty: 30 | Fill #0

## 2019-02-24 ENCOUNTER — Other Ambulatory Visit: Payer: No Typology Code available for payment source | Admitting: Adult Health

## 2019-02-28 IMAGING — MG MM PLC BREAST LOC DEV 1ST LESION INC MAMMO GUIDE*L*
8 of 11 series · 8 of 11 positions shown · non-contrast
Comparison: Previous exam(s).

CLINICAL DATA: Recently diagnosed left breast DCIS, 2 sites.
preoperative radioactive seed localizations.

EXAM:
MAMMOGRAPHIC GUIDED RADIOACTIVE SEED LOCALIZATION OF THE LEFT BREAST
x2

[L LM (1 of 4)]
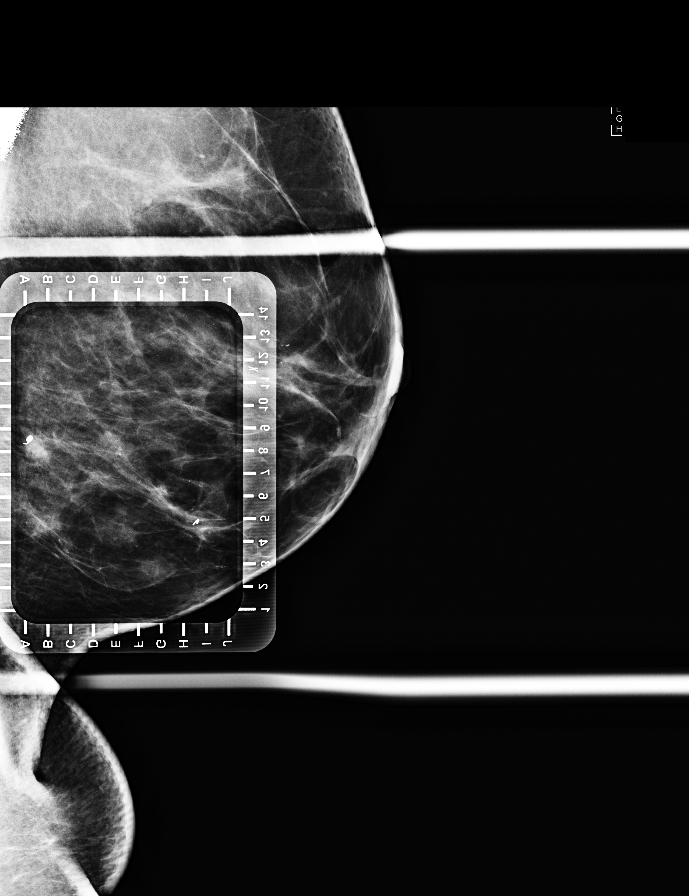

[L CC (1 of 4)]
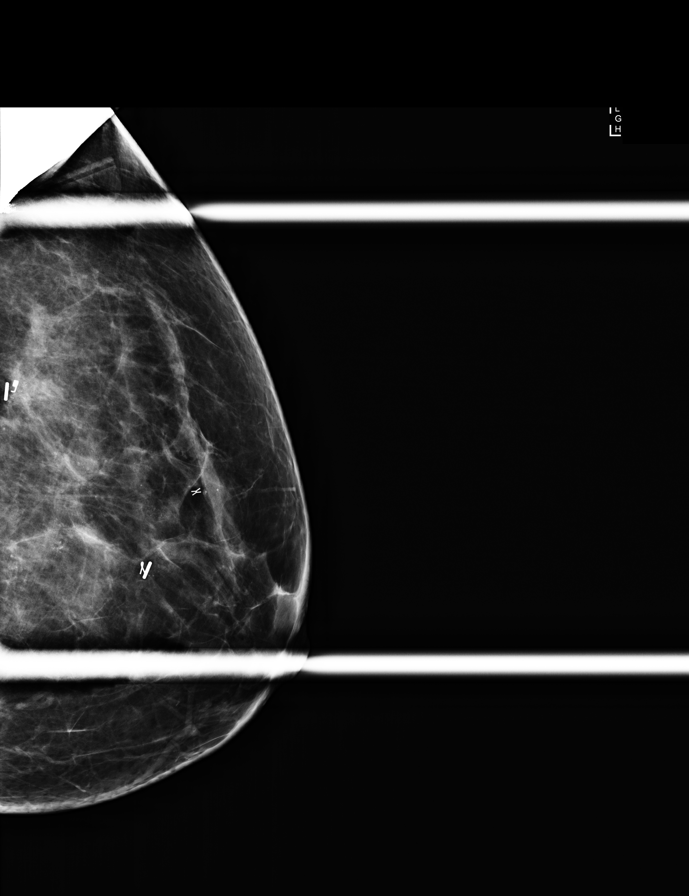

[L CC (2 of 4)]
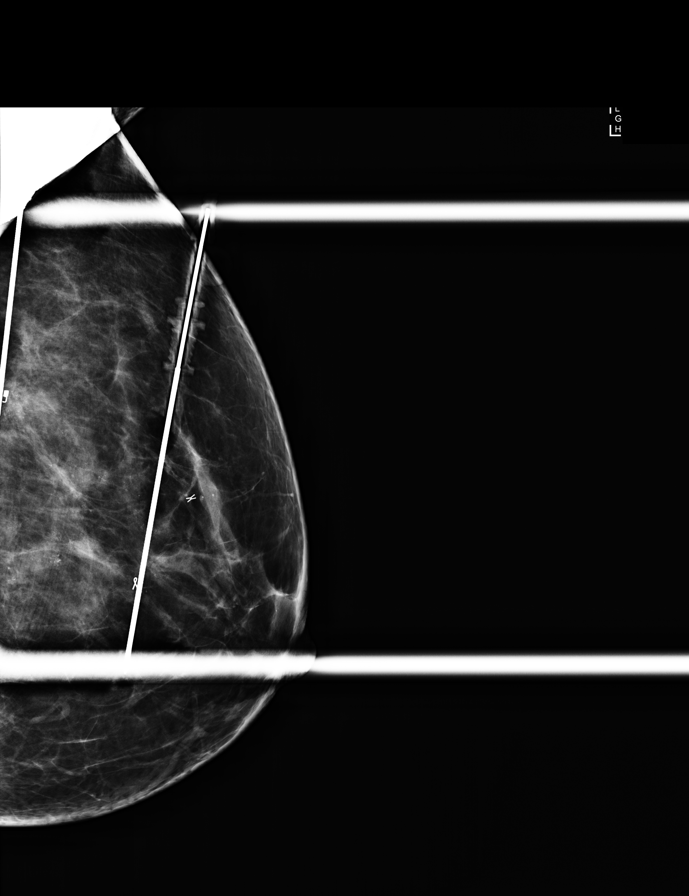

[L LM (2 of 4)]
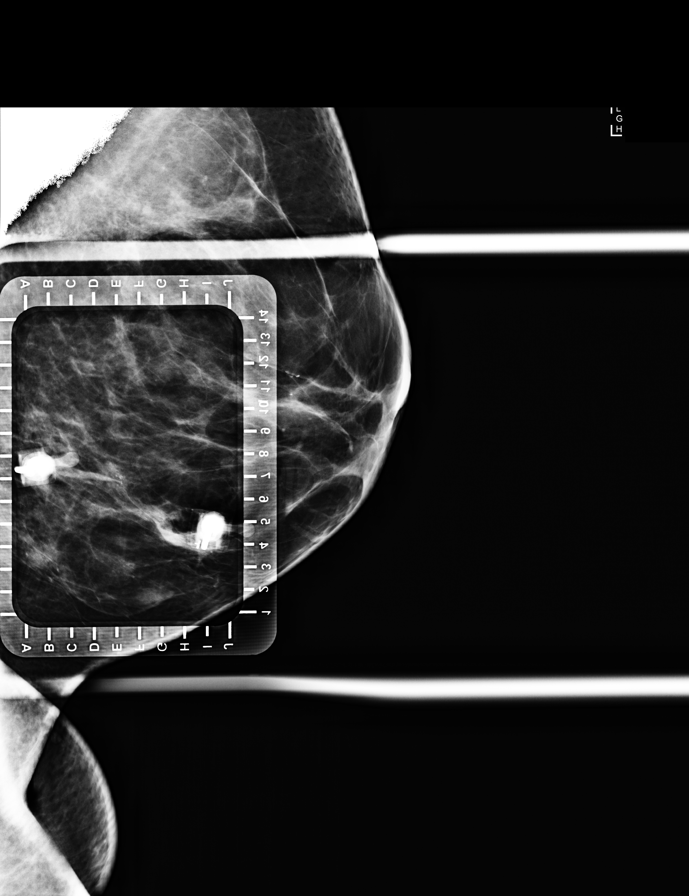

[L CC (3 of 4)]
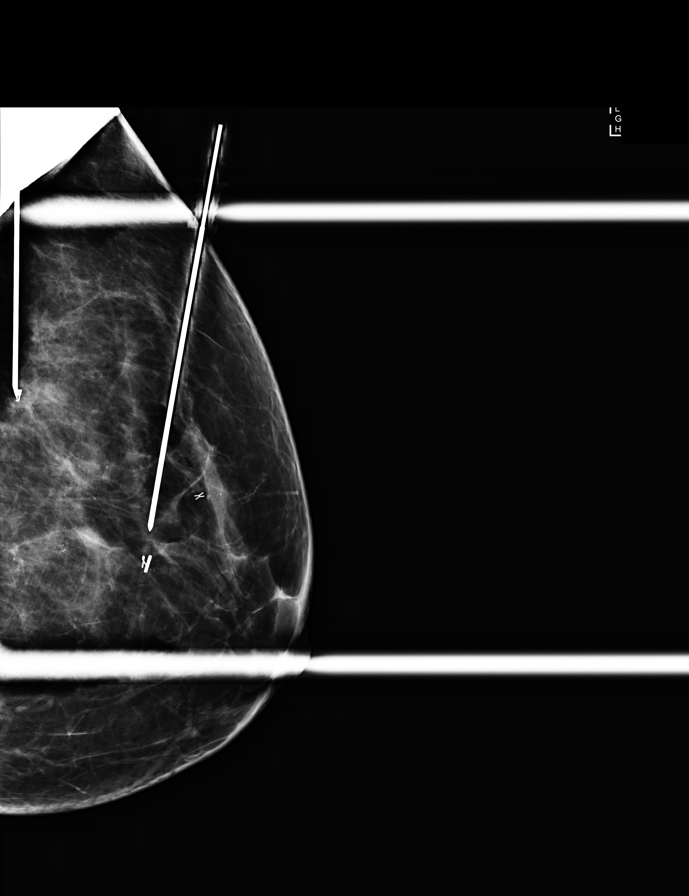

[L LM (3 of 4)]
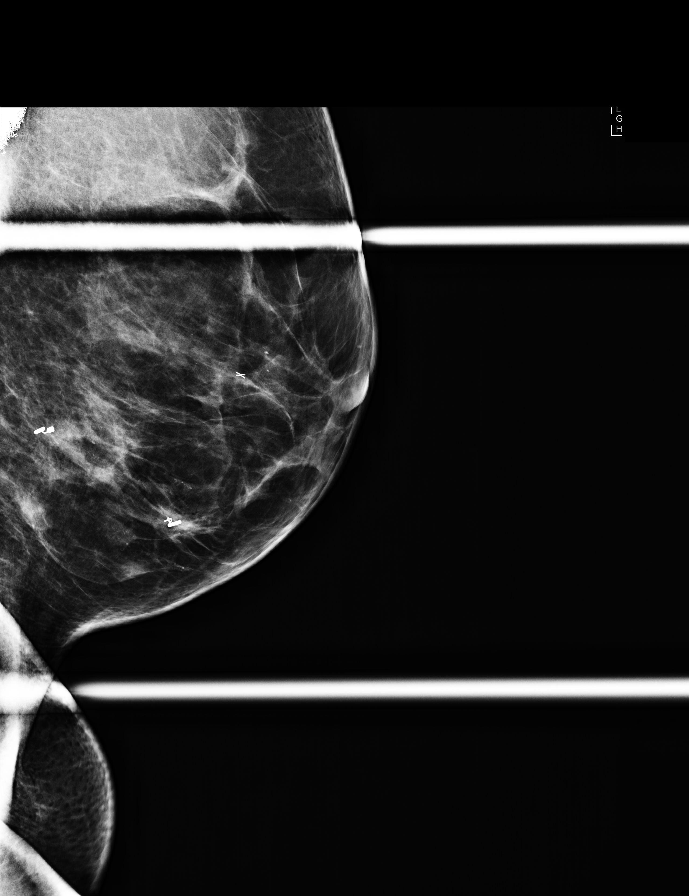

[L LM (4 of 4)]
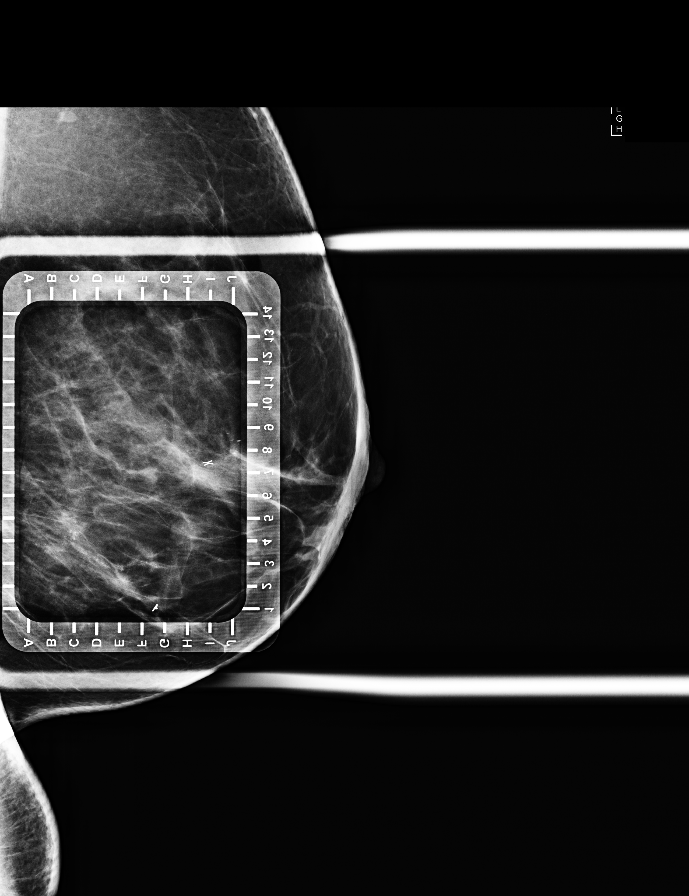

[L CC (4 of 4)]
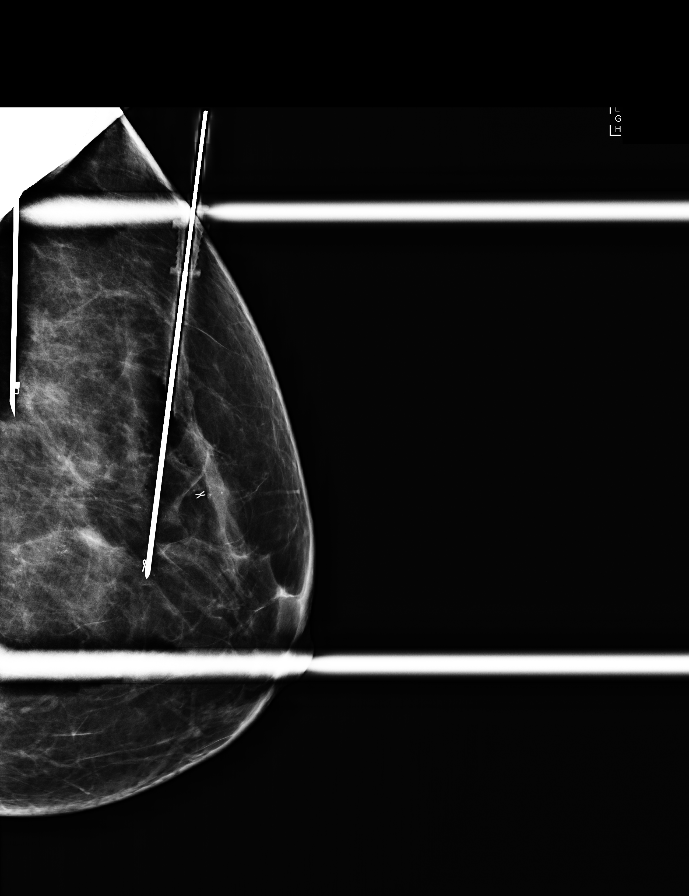

[8 of 11 positions shown; findings below may reference images not displayed]

FINDINGS: Patient presents for radioactive seed localization prior to breast
conservation surgery. I met with the patient and we discussed the
procedure of seed localization including benefits and alternatives.
We discussed the high likelihood of a successful procedure. We
discussed the risks of the procedure including infection, bleeding,
tissue injury and further surgery. We discussed the low dose of
radioactivity involved in the procedure. Informed, written consent
was given.

The usual time-out protocol was performed immediately prior to the
procedure.

Site 1: Using mammographic guidance, sterile technique, 1% lidocaine
and an D-PP0 radioactive seed, the ribbon shaped clip at anterior
depth was localized using a lateral approach. The follow-up
mammogram images confirm the seed in the expected location and were
marked for Dr. Bambucafe.

Follow-up survey of the patient confirms presence of the radioactive
seed.

Order number of D-PP0 seed:  [id number].

Total activity:  0.251 millicuries reference Date: 09/14/2017

Site 2: Using mammographic guidance, stereotactic technique, 1%
lidocaineand an D-PP0 radioactive seed, the coil shaped clip at
posterior depth was localized using a lateral approach. The
follow-up mammogram images confirm the seed in the expected location
and were marked for Dr. Bambucafe.

Follow-up survey of the patient confirms presence of the radioactive
seed.

Order number of D-PP0 seed: [id number]

Total activity: 0.251 millicuries reference date 09/14/2017

The patient tolerated the procedure well and was released from the
[REDACTED]. She was given instructions regarding seed removal.
IMPRESSION: Radioactive seed localization left breast (2 sites). No apparent
complications.

## 2019-03-20 MED FILL — OLMESARTAN-HCTZ 20-12.5 MG: 20-12.5 | 30 days supply | Qty: 30 | Fill #1

## 2019-03-31 ENCOUNTER — Ambulatory Visit: Payer: No Typology Code available for payment source | Admitting: Physician Assistant

## 2019-04-06 ENCOUNTER — Other Ambulatory Visit: Payer: Self-pay

## 2019-04-07 ENCOUNTER — Ambulatory Visit (INDEPENDENT_AMBULATORY_CARE_PROVIDER_SITE_OTHER): Payer: No Typology Code available for payment source | Admitting: Physician Assistant

## 2019-04-07 ENCOUNTER — Encounter: Payer: Self-pay | Admitting: Physician Assistant

## 2019-04-07 DIAGNOSIS — I10 Essential (primary) hypertension: Secondary | ICD-10-CM

## 2019-04-07 MED ORDER — POTASSIUM CHLORIDE ER 10 MEQ PO TBCR: 10.0000 meq | EXTENDED_RELEASE_TABLET | Freq: Every day | ORAL | 3 refills | Status: DC

## 2019-04-07 MED ORDER — OLMESARTAN MEDOXOMIL-HCTZ 20-12.5 MG PO TABS: 1.0000 | ORAL_TABLET | Freq: Every day | ORAL | 3 refills | Status: DC

## 2019-04-07 MED FILL — POTASSIUM CL ER 10 MEQ TAB: 10 | 90 days supply | Qty: 90 | Fill #0

## 2019-04-09 NOTE — Progress Notes (Signed)
BP 112/76   Pulse 99   Temp (!) 97.3 F (36.3 C) (Temporal)   Ht '5\' 3"'$  (1.6 m)   Wt 178 lb 12.8 oz (81.1 kg)   SpO2 99%   BMI 31.67 kg/m    Subjective:    Patient ID: Sara Freeman, female    DOB: June 14, 1971, 48 y.o.   MRN: [id number]  HPI: Sara Freeman is a 48 y.o. female presenting on 04/07/2019 for Medical Management of Chronic Issues (3 month) and Hypertension This patient comes in for 45-monthrecheck on her chronic medical conditions which do include hypertension and hypokalemia.  She has been quite stable for some time.  At this time we will extend her visits out to 1 year.  She is still being followed by her oncologist and her surgeon.  She reports that overall she is doing very well.   Past Medical History:  Diagnosis Date  . Allergy   . [place]   . [place]  . Endometriosis   . Family history of breast cancer   . Hypertension   . Personal history of radiation therapy    Relevant past medical, surgical, family and social history reviewed and updated as indicated. Interim medical history since our last visit reviewed. Allergies and medications reviewed and updated. DATA REVIEWED: CHART IN EPIC  Family History reviewed for pertinent findings.  Review of Systems  Constitutional: Negative.   HENT: Negative.   Eyes: Negative.   Respiratory: Negative.   Gastrointestinal: Negative.   Genitourinary: Negative.     Allergies as of 04/07/2019      Reactions   Lisinopril Cough   Penicillins Other (See Comments)   UNSPECIFIED REACTION OF CHILDHOOD Has patient had a PCN reaction causing immediate rash, facial/tongue/throat swelling, SOB or lightheadedness with hypotension: Unknown Has patient had a PCN reaction causing severe rash involving mucus membranes or skin necrosis: Unknown Has patient had a PCN reaction that required hospitalization: Unknown Has patient had a PCN reaction occurring within the last 10 years: Unknown If all of the  above answers are "NO", then may proceed with Cephalosporin use.   Azithromycin Itching   Itching of skin without rash      Medication List       Accurate as of April 07, 2019 11:59 PM. If you have any questions, ask your nurse or doctor.        calcium-vitamin D 250-100 MG-UNIT tablet Take 1 tablet by mouth daily. Taking calcium 600 mg plus vitamin D once daily   Claritin 10 MG tablet Generic drug: loratadine Take 10 mg by mouth daily.   olmesartan-hydrochlorothiazide 20-12.5 MG tablet Commonly known as: BENICAR HCT Take 1 tablet by mouth daily.   potassium chloride 10 MEQ tablet Commonly known as: K-DUR Take 1 tablet (10 mEq total) by mouth daily.   tamoxifen 20 MG tablet Commonly known as: NOLVADEX TAKE 1 TABLET BY MOUTH DAILY.          Objective:    BP 112/76   Pulse 99   Temp (!) 97.3 F (36.3 C) (Temporal)   Ht '5\' 3"'$  (1.6 m)   Wt 178 lb 12.8 oz (81.1 kg)   SpO2 99%   BMI 31.67 kg/m   Allergies  Allergen Reactions  . Lisinopril Cough  . Penicillins Other (See Comments)    UNSPECIFIED REACTION OF CHILDHOOD Has patient had a PCN reaction causing immediate rash, facial/tongue/throat swelling, SOB or lightheadedness with hypotension: Unknown Has patient had  a PCN reaction causing severe rash involving mucus membranes or skin necrosis: Unknown Has patient had a PCN reaction that required hospitalization: Unknown Has patient had a PCN reaction occurring within the last 10 years: Unknown If all of the above answers are "NO", then may proceed with Cephalosporin use.   . Azithromycin Itching    Itching of skin without rash    Wt Readings from Last 3 Encounters:  04/07/19 178 lb 12.8 oz (81.1 kg)  01/27/19 181 lb 11.2 oz (82.4 kg)  12/23/18 178 lb (80.7 kg)    Physical Exam Constitutional:      General: She is not in acute distress.    Appearance: Normal appearance. She is well-developed.  HENT:     Head: Normocephalic and atraumatic.   Cardiovascular:     Rate and Rhythm: Normal rate.  Pulmonary:     Effort: Pulmonary effort is normal.  Skin:    General: Skin is warm and dry.     Findings: No rash.  Neurological:     Mental Status: She is alert and oriented to person, place, and time.     Deep Tendon Reflexes: Reflexes are normal and symmetric.     Results for orders placed or performed in visit on 01/13/19  CBC with Differential/Platelet  Result Value Ref Range   WBC 7.1 3.4 - 10.8 x10E3/uL   RBC 4.63 3.77 - 5.28 x10E6/uL   Hemoglobin 13.2 11.1 - 15.9 g/dL   Hematocrit 39.9 34.0 - 46.6 %   MCV 86 79 - 97 fL   MCH 28.5 26.6 - 33.0 pg   MCHC 33.1 31.5 - 35.7 g/dL   RDW 12.5 11.7 - 15.4 %   Platelets 224 150 - 450 x10E3/uL   Neutrophils 65 Not Estab. %   Lymphs 25 Not Estab. %   Monocytes 7 Not Estab. %   Eos 2 Not Estab. %   Basos 1 Not Estab. %   Neutrophils Absolute 4.6 1.4 - 7.0 x10E3/uL   Lymphocytes Absolute 1.7 0.7 - 3.1 x10E3/uL   Monocytes Absolute 0.5 0.1 - 0.9 x10E3/uL   EOS (ABSOLUTE) 0.1 0.0 - 0.4 x10E3/uL   Basophils Absolute 0.1 0.0 - 0.2 x10E3/uL   Immature Granulocytes 0 Not Estab. %   Immature Grans (Abs) 0.0 0.0 - 0.1 x10E3/uL  CMP14+EGFR  Result Value Ref Range   Glucose 87 65 - 99 mg/dL   BUN 13 6 - 24 mg/dL   Creatinine, Ser 0.84 0.57 - 1.00 mg/dL   GFR calc non Af Amer 83 >59 mL/min/1.73   GFR calc Af Amer 96 >59 mL/min/1.73   BUN/Creatinine Ratio 15 9 - 23   Sodium 139 134 - 144 mmol/L   Potassium 4.2 3.5 - 5.2 mmol/L   Chloride 102 96 - 106 mmol/L   CO2 24 20 - 29 mmol/L   Calcium 9.3 8.7 - 10.2 mg/dL   Total Protein 6.3 6.0 - 8.5 g/dL   Albumin 4.1 3.8 - 4.8 g/dL   Globulin, Total 2.2 1.5 - 4.5 g/dL   Albumin/Globulin Ratio 1.9 1.2 - 2.2   Bilirubin Total 0.3 0.0 - 1.2 mg/dL   Alkaline Phosphatase 44 39 - 117 IU/L   AST 15 0 - 40 IU/L   ALT 9 0 - 32 IU/L  Lipid panel  Result Value Ref Range   Cholesterol, Total 188 100 - 199 mg/dL   Triglycerides 140 0 - 149  mg/dL   HDL 66 >39 mg/dL   VLDL Cholesterol Cal 28  5 - 40 mg/dL   LDL Calculated 94 0 - 99 mg/dL   Chol/HDL Ratio 2.8 0.0 - 4.4 ratio      Assessment & Plan:   1. Hypertension, unspecified type - olmesartan-hydrochlorothiazide (BENICAR HCT) 20-12.5 MG tablet; Take 1 tablet by mouth daily.  Dispense: 90 tablet; Refill: 3 - potassium chloride (K-DUR) 10 MEQ tablet; Take 1 tablet (10 mEq total) by mouth daily.  Dispense: 90 tablet; Refill: 3   Continue all other maintenance medications as listed above.  Follow up plan: Return in about 1 year (around 04/06/2020).  Educational handout given for [place] PA-C [facility] [address]  [place], [place] [address] [phone]   04/09/2019, 10:00 PM

## 2019-04-10 MED FILL — TAMOXIFEN 20 MG TABLET: 20 | 90 days supply | Qty: 90 | Fill #2

## 2019-04-13 MED FILL — OLMESARTAN-HCTZ 20-12.5 MG: 20-12.5 | 30 days supply | Qty: 30 | Fill #1

## 2019-04-21 ENCOUNTER — Encounter: Payer: Self-pay | Admitting: Adult Health

## 2019-04-21 ENCOUNTER — Other Ambulatory Visit: Payer: Self-pay

## 2019-04-21 ENCOUNTER — Ambulatory Visit (INDEPENDENT_AMBULATORY_CARE_PROVIDER_SITE_OTHER): Payer: No Typology Code available for payment source | Admitting: Adult Health

## 2019-04-21 VITALS — BP 123/81 | HR 88 | Ht 63.0 in | Wt 180.0 lb

## 2019-04-21 DIAGNOSIS — Z1211 Encounter for screening for malignant neoplasm of colon: Secondary | ICD-10-CM | POA: Diagnosis not present

## 2019-04-21 DIAGNOSIS — D0512 Intraductal carcinoma in situ of left breast: Secondary | ICD-10-CM

## 2019-04-21 DIAGNOSIS — Z1212 Encounter for screening for malignant neoplasm of rectum: Secondary | ICD-10-CM | POA: Diagnosis not present

## 2019-04-21 DIAGNOSIS — Z01419 Encounter for gynecological examination (general) (routine) without abnormal findings: Secondary | ICD-10-CM | POA: Diagnosis not present

## 2019-04-21 HISTORY — DX: Encounter for gynecological examination (general) (routine) without abnormal findings: Z01.419

## 2019-04-21 HISTORY — DX: Encounter for screening for malignant neoplasm of colon: Z12.11

## 2019-04-21 LAB — HEMOCCULT GUIAC POC 1CARD (OFFICE): Fecal Occult Blood, POC: NEGATIVE

## 2019-04-21 NOTE — Progress Notes (Signed)
Patient ID: Sara Freeman, female   DOB: 12-30-1970, 48 y.o.   MRN: [id number] History of Present Illness: Sara Freeman is a 48 year old white female, married, G1P1, sp breast cancer, on tamoxifen in for a well woman gyn exam, she had a normal pap with negative HPV 02/18/18.  PCP is Particia Nearing PA.   Current Medications, Allergies, Past Medical History, Past Surgical History, Family History and Social History were reviewed in [employer] record.     Review of Systems: Patient denies any headaches, hearing loss, fatigue, blurred vision, shortness of breath, chest pain, abdominal pain, problems with bowel movements, urination, or intercourse. No joint pain or mood swings. Still having periods, occasional hot flashes at night.    Physical Exam:BP 123/81 (BP Location: Right Arm, Patient Position: Sitting, Cuff Size: Normal)   Pulse 88   Ht 5\' 3"  (1.6 m)   Wt 180 lb (81.6 kg)   LMP 04/11/2019   BMI 31.89 kg/m  General:  Well developed, well nourished, no acute distress Skin:  Warm and dry Neck:  Midline trachea, normal thyroid, good ROM, no lymphadenopathy Lungs; Clear to auscultation bilaterally Breast:  No dominant palpable mass, retraction, or nipple discharge, sp radiation left breast, some thickness in areola Cardiovascular: Regular rate and rhythm Abdomen:  Soft, non tender, no hepatosplenomegaly Pelvic:  External genitalia is normal in appearance, no lesions.  The vagina is normal in appearance. Urethra has no lesions or masses. The cervix is bulbous.  Uterus is felt to be normal size, shape, and contour.  No adnexal masses or tenderness noted.Bladder is non tender, no masses felt. Rectal: Good sphincter tone, no polyps, or hemorrhoids felt.  Hemoccult negative. Extremities/musculoskeletal:  No swelling or varicosities noted, no clubbing or cyanosis Psych:  No mood changes, alert and cooperative,seems happy Fall risk is low PHQ 2 score 0. Examination chaperoned by  Estill Bamberg Rash LPN.  Impression and Plan: 1. Encounter for well woman exam with routine gynecological exam -physical in 1 year -pap in 2022 -labs with PCP -discussed perimenopause and menopause changes   2. Encounter for colorectal cancer screening -colonoscopy per GI   3. Ductal carcinoma in situ (DCIS) of left breast -mammogram yearly

## 2019-05-15 MED FILL — OLMESARTAN-HCTZ 20-12.5 MG: 20-12.5 | 30 days supply | Qty: 30 | Fill #2

## 2019-06-19 MED FILL — OLMESARTAN-HCTZ 20-12.5 MG: 20-12.5 | 30 days supply | Qty: 30 | Fill #3

## 2019-07-09 MED FILL — POTASSIUM CL ER 10 MEQ TAB: 10 | 90 days supply | Qty: 90 | Fill #1

## 2019-07-09 MED FILL — TAMOXIFEN 20 MG TABLET: 20 | 90 days supply | Qty: 90 | Fill #3

## 2019-07-13 MED FILL — OLMESARTAN-HCTZ 20-12.5 MG: 20-12.5 | 30 days supply | Qty: 30 | Fill #4

## 2019-08-25 ENCOUNTER — Other Ambulatory Visit: Payer: No Typology Code available for payment source

## 2019-08-25 ENCOUNTER — Other Ambulatory Visit: Payer: Self-pay

## 2019-08-25 ENCOUNTER — Other Ambulatory Visit: Payer: Self-pay | Admitting: Physician Assistant

## 2019-08-25 DIAGNOSIS — D0512 Intraductal carcinoma in situ of left breast: Secondary | ICD-10-CM

## 2019-08-25 NOTE — Addendum Note (Signed)
Addended by: Liliane Bade on: 08/25/2019 12:06 PM   Modules accepted: Orders

## 2019-08-25 NOTE — Addendum Note (Signed)
Addended by: Liliane Bade on: 08/25/2019 12:05 PM   Modules accepted: Orders

## 2019-08-26 LAB — COMPREHENSIVE METABOLIC PANEL
ALT: 9 IU/L (ref 0–32)
AST: 13 IU/L (ref 0–40)
Albumin/Globulin Ratio: 1.6 (ref 1.2–2.2)
Albumin: 4.2 g/dL (ref 3.8–4.8)
Alkaline Phosphatase: 50 IU/L (ref 39–117)
BUN/Creatinine Ratio: 13 (ref 9–23)
BUN: 10 mg/dL (ref 6–24)
Bilirubin Total: <0.2 mg/dL (ref 0.0–1.2)
CO2: 23 mmol/L (ref 20–29)
Calcium: 10.1 mg/dL (ref 8.7–10.2)
Chloride: 100 mmol/L (ref 96–106)
Creatinine, Ser: 0.77 mg/dL (ref 0.57–1.00)
GFR calc Af Amer: 106 mL/min/{1.73_m2} (ref >59)
GFR calc non Af Amer: 92 mL/min/{1.73_m2} (ref >59)
Globulin, Total: 2.7 g/dL (ref 1.5–4.5)
Glucose: 80 mg/dL (ref 65–99)
Potassium: 4 mmol/L (ref 3.5–5.2)
Sodium: 139 mmol/L (ref 134–144)
Total Protein: 6.9 g/dL (ref 6.0–8.5)

## 2019-08-26 LAB — VITAMIN B12: Vitamin B-12: 479 pg/mL (ref 232–1245)

## 2019-08-26 LAB — VITAMIN D 25 HYDROXY (VIT D DEFICIENCY, FRACTURES): Vit D, 25-Hydroxy: 37.8 ng/mL (ref 30.0–100.0)

## 2019-08-29 LAB — CBC WITH DIFFERENTIAL/PLATELET
Basophils Absolute: 0.1 10*3/uL (ref 0.0–0.2)
Basos: 1 %
EOS (ABSOLUTE): 0.2 10*3/uL (ref 0.0–0.4)
Eos: 3 %
Hematocrit: 39.4 % (ref 34.0–46.6)
Hemoglobin: 13.3 g/dL (ref 11.1–15.9)
Immature Grans (Abs): 0 10*3/uL (ref 0.0–0.1)
Immature Granulocytes: 0 %
Lymphocytes Absolute: 1.8 10*3/uL (ref 0.7–3.1)
Lymphs: 27 %
MCH: 28.5 pg (ref 26.6–33.0)
MCHC: 33.8 g/dL (ref 31.5–35.7)
MCV: 85 fL (ref 79–97)
Monocytes Absolute: 0.5 10*3/uL (ref 0.1–0.9)
Monocytes: 7 %
Neutrophils Absolute: 4.1 10*3/uL (ref 1.4–7.0)
Neutrophils: 62 %
Platelets: 262 10*3/uL (ref 150–450)
RBC: 4.66 x10E6/uL (ref 3.77–5.28)
RDW: 12.1 % (ref 11.7–15.4)
WBC: 6.8 10*3/uL (ref 3.4–10.8)

## 2019-08-29 LAB — LACTATE DEHYDROGENASE, ISOENZYMES
(LD) Fraction 1: 21 % (ref 17–32)
(LD) Fraction 2: 36 % (ref 25–40)
(LD) Fraction 3: 23 % (ref 17–27)
(LD) Fraction 4: 10 % (ref 5–13)
(LD) Fraction 5: 10 % (ref 4–20)
LDH: 155 IU/L (ref 119–226)

## 2019-09-01 ENCOUNTER — Other Ambulatory Visit: Payer: Self-pay

## 2019-09-01 ENCOUNTER — Ambulatory Visit
Admission: RE | Admit: 2019-09-01 | Discharge: 2019-09-01 | Disposition: A | Discharge status: Home / Self Care | Payer: No Typology Code available for payment source | Source: Ambulatory Visit | Attending: Nurse Practitioner | Admitting: Nurse Practitioner

## 2019-09-01 DIAGNOSIS — D0512 Intraductal carcinoma in situ of left breast: Secondary | ICD-10-CM

## 2019-09-05 ENCOUNTER — Ambulatory Visit (HOSPITAL_COMMUNITY): Payer: No Typology Code available for payment source | Admitting: Nurse Practitioner

## 2019-09-08 ENCOUNTER — Other Ambulatory Visit: Payer: Self-pay

## 2019-09-08 ENCOUNTER — Inpatient Hospital Stay (HOSPITAL_COMMUNITY): Payer: No Typology Code available for payment source | Attending: Nurse Practitioner | Admitting: Nurse Practitioner

## 2019-09-08 DIAGNOSIS — Z79810 Long term (current) use of selective estrogen receptor modulators (SERMs): Secondary | ICD-10-CM | POA: Insufficient documentation

## 2019-09-08 DIAGNOSIS — Z87891 Personal history of nicotine dependence: Secondary | ICD-10-CM | POA: Insufficient documentation

## 2019-09-08 DIAGNOSIS — Z170 Estrogen receptor positive status [ER+]: Secondary | ICD-10-CM | POA: Insufficient documentation

## 2019-09-08 DIAGNOSIS — D0512 Intraductal carcinoma in situ of left breast: Secondary | ICD-10-CM | POA: Diagnosis present

## 2019-09-08 DIAGNOSIS — I10 Essential (primary) hypertension: Secondary | ICD-10-CM | POA: Diagnosis not present

## 2019-09-08 DIAGNOSIS — Z923 Personal history of irradiation: Secondary | ICD-10-CM | POA: Insufficient documentation

## 2019-09-08 DIAGNOSIS — Z79899 Other long term (current) drug therapy: Secondary | ICD-10-CM | POA: Insufficient documentation

## 2019-09-08 DIAGNOSIS — Z8349 Family history of other endocrine, nutritional and metabolic diseases: Secondary | ICD-10-CM | POA: Diagnosis not present

## 2019-09-08 DIAGNOSIS — Z8249 Family history of ischemic heart disease and other diseases of the circulatory system: Secondary | ICD-10-CM | POA: Diagnosis not present

## 2019-09-08 DIAGNOSIS — Z803 Family history of malignant neoplasm of breast: Secondary | ICD-10-CM | POA: Insufficient documentation

## 2019-09-08 NOTE — Progress Notes (Signed)
[place], [facility] [address]   CLINIC:  Medical Oncology/Hematology  PCP:  Terald Sleeper, PA-C [place] [address] [phone]   REASON FOR VISIT: Follow-up for breast cancer  CURRENT THERAPY: Tamoxifen  CANCER STAGING: Cancer Staging Ductal carcinoma in situ (DCIS) of left breast Staging form: Breast, AJCC 8th Edition - Clinical stage from 10/01/2017: Stage 0 (cTis (DCIS), cN0, cM0, G3, ER: Positive, PR: Positive, HER2: Not assessed ) - Signed by Derek Jack, MD on 10/01/2017    INTERVAL HISTORY:  [name] 49 y.o. female returns for routine follow-up for breast cancer.  Patient reports she has done well since her last visit.  She denies any new lumps or bumps present.  She denies any easy bruising or bleeding.  She denies any extreme fatigue. Denies any nausea, vomiting, or diarrhea. Denies any new pains. Had not noticed any recent bleeding such as epistaxis, hematuria or hematochezia. Denies recent chest pain on exertion, shortness of breath on minimal exertion, pre-syncopal episodes, or palpitations. Denies any numbness or tingling in hands or feet. Denies any recent fevers, infections, or recent hospitalizations. Patient reports appetite at 100% and energy level at 100%.     REVIEW OF SYSTEMS:  Review of Systems  All other systems reviewed and are negative.    PAST MEDICAL/SURGICAL HISTORY:  Past Medical History:  Diagnosis Date  . Allergy   . Breast cancer ([facility])   . Breast cancer ([facility]) 09/09/2017  . Breast disorder    cancer  . Endometriosis   . Family history of breast cancer   . Hypertension   . Personal history of radiation therapy    Past Surgical History:  Procedure Laterality Date  . BREAST BIOPSY Left 2017   benign  . BREAST BIOPSY Left 08/27/2017   Needle core biopsy  . BREAST LUMPECTOMY Left 10/19/2017   Procedure: LEFT BREAST RE EXCISION LUMPECTOMY;  Surgeon: Erroll Luna, MD;   Location: [facility];  Service: General;  Laterality: Left;  . BREAST LUMPECTOMY WITH RADIOACTIVE SEED LOCALIZATION Left 09/22/2017   Procedure: BREAST LUMPECTOMY  WITH RADIOACTIVE SEED LOCALIZATION X 2;  Surgeon: Erroll Luna, MD;  Location: [facility];  Service: General;  Laterality: Left;  . TUBAL LIGATION  1999   [facility]     SOCIAL HISTORY:  Social History   Socioeconomic History  . Marital status: Married    Spouse name: [name]  . Number of children: 1  . Years of education: 55  . Highest education level: Associate degree: academic program  Occupational History  . 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 Smoking status: Former Smoker    Packs/day: 1.00    Types: Cigarettes    Start date: 07/20/1986    Quit date: 07/20/2006    Years since quitting: 13.1  . Smokeless tobacco: Never Used  Substance and Sexual Activity  . Alcohol use: No  . Drug use: No  . Sexual activity: Yes    Birth control/protection: Surgical    Comment: tubal  Other Topics Concern  . Not on file  Social History Narrative   Lives with husband Kandra Nicolas is at home but spends  Time with his GF   Has 2 dachshund   Likes to read   [employer] [place] once a month   Social Determinants of Health   Financial Resource Strain:   . Difficulty of Paying Living Expenses: Not  on file  Food Insecurity:   . Worried About Charity fundraiser in the Last Year: Not on file  . Ran Out of Food in the Last Year: Not on file  Transportation Needs:   . Lack of Transportation (Medical): Not on file  . Lack of Transportation (Non-Medical): Not on file  Physical Activity:   . Days of Exercise per Week: Not on file  . Minutes of Exercise per Session: Not on file  Stress:   . Feeling of Stress : Not on file  Social Connections:   . Frequency of Communication with Friends and Family: Not on file  . Frequency of Social Gatherings with Friends and Family: Not on file  . Attends  Religious Services: Not on file  . Active Member of Clubs or Organizations: Not on file  . Attends Archivist Meetings: Not on file  . Marital Status: Not on file  Intimate Partner Violence:   . Fear of Current or Ex-Partner: Not on file  . Emotionally Abused: Not on file  . Physically Abused: Not on file  . Sexually Abused: Not on file    FAMILY HISTORY:  Family History  Problem Relation Age of Onset  . CAD Other   . Heart disease Other   . Stroke Paternal Grandfather   . Stroke Paternal Grandmother   . Aneurysm Maternal Grandfather   . Heart attack Father   . Hypertension Father   . COPD Father   . Heart disease Father        54 died  . Thyroid disease Mother   . Hypertension Mother   . Hypertension Brother   . Stroke Paternal Uncle   . Breast cancer Other 60       metastatic    CURRENT MEDICATIONS:  Outpatient Encounter Medications as of 09/08/2019  Medication Sig  . calcium-vitamin D 250-100 MG-UNIT tablet Take 1 tablet by mouth daily. Taking calcium 600 mg plus vitamin D once daily  . loratadine (CLARITIN) 10 MG tablet Take 10 mg by mouth daily.  Marland Kitchen olmesartan-hydrochlorothiazide (BENICAR HCT) 20-12.5 MG tablet Take 1 tablet by mouth daily.  . potassium chloride (K-DUR) 10 MEQ tablet Take 1 tablet (10 mEq total) by mouth daily.  . tamoxifen (NOLVADEX) 20 MG tablet TAKE 1 TABLET BY MOUTH DAILY.   No facility-administered encounter medications on file as of 09/08/2019.    ALLERGIES:  Allergies  Allergen Reactions  . Lisinopril Cough  . Penicillins Other (See Comments)    UNSPECIFIED REACTION OF CHILDHOOD Has patient had a PCN reaction causing immediate rash, facial/tongue/throat swelling, SOB or lightheadedness with hypotension: Unknown Has patient had a PCN reaction causing severe rash involving mucus membranes or skin necrosis: Unknown Has patient had a PCN reaction that required hospitalization: Unknown Has patient had a PCN reaction occurring within  the last 10 years: Unknown If all of the above answers are "NO", then may proceed with Cephalosporin use.   . Azithromycin Itching    Itching of skin without rash     PHYSICAL EXAM:  ECOG Performance status: 1  Vitals:   09/08/19 1251  BP: 113/74  Pulse: (!) 105  Resp: 14  Temp: (!) 97.5 F (36.4 C)  SpO2: 96%   Filed Weights   09/08/19 1251  Weight: 186 lb (84.4 kg)    Physical Exam Constitutional:      Appearance: Normal appearance. She is normal weight.  Cardiovascular:     Rate and Rhythm: Normal rate and regular rhythm.  Heart sounds: Normal heart sounds.  Pulmonary:     Effort: Pulmonary effort is normal.     Breath sounds: Normal breath sounds.  Abdominal:     General: Bowel sounds are normal.     Palpations: Abdomen is soft.  Musculoskeletal:        General: Normal range of motion.  Skin:    General: Skin is warm.  Neurological:     Mental Status: She is alert. Mental status is at baseline.  Psychiatric:        Mood and Affect: Mood normal.        Behavior: Behavior normal.        Thought Content: Thought content normal.        Judgment: Judgment normal.   Breast: No palpable masses, no skin changes or nipple discharge, no adenopathy.   LABORATORY DATA:  I have reviewed the labs as listed.  CBC    Component Value Date/Time   WBC 6.8 08/25/2019 1211   WBC 8.9 05/20/2018 1142   RBC 4.66 08/25/2019 1211   RBC 4.71 05/20/2018 1142   HGB 13.3 08/25/2019 1211   HCT 39.4 08/25/2019 1211   PLT 262 08/25/2019 1211   MCV 85 08/25/2019 1211   MCH 28.5 08/25/2019 1211   MCH 28.7 05/20/2018 1142   MCHC 33.8 08/25/2019 1211   MCHC 32.9 05/20/2018 1142   RDW 12.1 08/25/2019 1211   LYMPHSABS 1.8 08/25/2019 1211   MONOABS 0.7 05/20/2018 1142   EOSABS 0.2 08/25/2019 1211   BASOSABS 0.1 08/25/2019 1211   CMP Latest Ref Rng & Units 08/25/2019 01/13/2019 09/02/2018  Glucose 65 - 99 mg/dL 80 87 -  BUN 6 - 24 mg/dL 10 13 -  Creatinine 0.57 - 1.00 mg/dL  0.77 0.84 -  Sodium 134 - 144 mmol/L 139 139 -  Potassium 3.5 - 5.2 mmol/L 4.0 4.2 3.6  Chloride 96 - 106 mmol/L 100 102 -  CO2 20 - 29 mmol/L 23 24 -  Calcium 8.7 - 10.2 mg/dL 10.1 9.3 -  Total Protein 6.0 - 8.5 g/dL 6.9 6.3 -  Total Bilirubin 0.0 - 1.2 mg/dL <0.2 0.3 -  Alkaline Phos 39 - 117 IU/L 50 44 -  AST 0 - 40 IU/L 13 15 -  ALT 0 - 32 IU/L 9 9 -   DIAGNOSTIC IMAGING:  I have independently reviewed the mammogram scan and discussed with the patient.    I personally performed a face-to-face visit.  All questions were answered to patient's stated satisfaction. Encouraged patient to call with any new concerns or questions before his next visit to the cancer center and we can certain see him sooner, if needed.     ASSESSMENT & PLAN:   Ductal carcinoma in situ (DCIS) of left breast 1.  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 -Status post lumpectomy on 09/22/2017, with positive inferior margins and other margins close. - Status post reexcision of lumpectomy margins on 10/19/2017, pathology showing no evidence of DCIS or invasive cancer. -Given high-grade DCIS and young age, it was recommended she have antiestrogen therapy with tamoxifen for 5 years. -She started tamoxifen 20 mg daily on 10/29/2017.  She is tolerating well. -Her last Pap smear was on 02/18/2018 which was negative. -Her last mammogram was on 08/31/2018 which was BI-RADS Category 2 benign. -Labs on 08/25/2019 showed her potassium 4.0, creatinine 0.77, WBC 6.8, hemoglobin 13.3, platelets 262. - She will continue taking calcium  and vitamin D daily. -She will follow-up in 6 months with repeat labs.      Orders placed this encounter:  Orders Placed This Encounter  Procedures  . Lactate dehydrogenase  . CBC with Differential/Platelet  . Comprehensive metabolic panel  . Vitamin B12  . VITAMIN D 25 Hydroxy (Vit-D Deficiency, Fractures)  . CBC with  Differential/Platelet  . Comprehensive metabolic panel  . Lactate dehydrogenase  . Vitamin B12  . VITAMIN D 25 Hydroxy (Vit-D Deficiency, Fractures)      Francene Finders, FNP-C [place] [phone]

## 2019-09-08 NOTE — Patient Instructions (Signed)
[facility] at [facility] Discharge Instructions  Follow up in 6 months with labs    Thank you for choosing [facility] at [facility] to provide your oncology and hematology care.  To afford each patient quality time with our provider, please arrive at least 15 minutes before your scheduled appointment time.   If you have a lab appointment with the Cancer Center please come in thru the Main Entrance and check in at the main information desk.  You need to re-schedule your appointment should you arrive 10 or more minutes late.  We strive to give you quality time with our providers, and arriving late affects you and other patients whose appointments are after yours.  Also, if you no show three or more times for appointments you may be dismissed from the clinic at the providers discretion.     Again, thank you for choosing [facility].  Our hope is that these requests will decrease the amount of time that you wait before being seen by our physicians.       _____________________________________________________________  Should you have questions after your visit to [facility], please contact our office at [phone] between the hours of 8:00 a.m. and 4:30 p.m.  Voicemails left after 4:00 p.m. will not be returned until the following business day.  For prescription refill requests, have your pharmacy contact our office and allow 72 hours.    Due to Covid, you will need to wear a mask upon entering the hospital. If you do not have a mask, a mask will be given to you at the Main Entrance upon arrival. For doctor visits, patients may have 1 support person with them. For treatment visits, patients can not have anyone with them due to social distancing guidelines and our immunocompromised population.      

## 2019-09-08 NOTE — Assessment & Plan Note (Addendum)
1.  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 -Status post lumpectomy on 09/22/2017, with positive inferior margins and other margins close. - Status post reexcision of lumpectomy margins on 10/19/2017, pathology showing no evidence of DCIS or invasive cancer. -Given high-grade DCIS and young age, it was recommended she have antiestrogen therapy with tamoxifen for 5 years. -She started tamoxifen 20 mg daily on 10/29/2017.  She is tolerating well. -Her last Pap smear was on 02/18/2018 which was negative. -Her last mammogram was on 08/31/2018 which was BI-RADS Category 2 benign. -Labs on 08/25/2019 showed her potassium 4.0, creatinine 0.77, WBC 6.8, hemoglobin 13.3, platelets 262. - She will continue taking calcium and vitamin D daily. -She will follow-up in 6 months with repeat labs.

## 2019-09-11 MED FILL — OLMESARTAN-HCTZ 20-12.5 MG: 20-12.5 | 30 days supply | Qty: 30 | Fill #2

## 2019-10-09 ENCOUNTER — Other Ambulatory Visit (HOSPITAL_COMMUNITY): Payer: Self-pay | Admitting: Hematology

## 2019-10-09 DIAGNOSIS — D0512 Intraductal carcinoma in situ of left breast: Secondary | ICD-10-CM

## 2019-10-09 MED FILL — TAMOXIFEN 20 MG TABLET: 20 | 90 days supply | Qty: 90 | Fill #0

## 2019-10-09 MED FILL — OLMESARTAN-HCTZ 20-12.5 MG: 20-12.5 | 30 days supply | Qty: 30 | Fill #3

## 2019-10-11 ENCOUNTER — Encounter: Payer: Self-pay | Admitting: Physician Assistant

## 2019-10-23 MED FILL — POTASSIUM CL ER 10 MEQ TAB: 10 | 90 days supply | Qty: 90 | Fill #2

## 2019-11-13 MED FILL — OLMESARTAN-HCTZ 20-12.5 MG: 20-12.5 | 30 days supply | Qty: 30 | Fill #4

## 2019-12-11 MED FILL — OLMESARTAN-HCTZ 20-12.5 MG: 20-12.5 | 30 days supply | Qty: 30 | Fill #5

## 2019-12-22 ENCOUNTER — Encounter: Payer: Self-pay | Admitting: *Deleted

## 2019-12-22 ENCOUNTER — Encounter: Payer: Self-pay | Admitting: Family Medicine

## 2020-01-08 MED FILL — TAMOXIFEN 20 MG TABLET: 20 | 90 days supply | Qty: 90 | Fill #1

## 2020-01-15 MED FILL — POTASSIUM CL ER 10 MEQ TAB: 10 | 90 days supply | Qty: 90 | Fill #3

## 2020-01-15 MED FILL — OLMESARTAN-HCTZ 20-12.5 MG: 20-12.5 | 30 days supply | Qty: 30 | Fill #0

## 2020-01-19 ENCOUNTER — Encounter: Payer: Self-pay | Admitting: *Deleted

## 2020-02-12 MED FILL — OLMESARTAN-HCTZ 20-12.5 MG: 20-12.5 | 30 days supply | Qty: 30 | Fill #1

## 2020-02-27 ENCOUNTER — Other Ambulatory Visit (HOSPITAL_COMMUNITY): Payer: Self-pay | Admitting: *Deleted

## 2020-02-27 ENCOUNTER — Telehealth (HOSPITAL_COMMUNITY): Payer: Self-pay | Admitting: *Deleted

## 2020-02-27 DIAGNOSIS — D509 Iron deficiency anemia, unspecified: Secondary | ICD-10-CM

## 2020-02-27 DIAGNOSIS — D500 Iron deficiency anemia secondary to blood loss (chronic): Secondary | ICD-10-CM

## 2020-02-27 DIAGNOSIS — D0512 Intraductal carcinoma in situ of left breast: Secondary | ICD-10-CM

## 2020-02-27 NOTE — Telephone Encounter (Signed)
Pt called into clinic stating she wanted her labs to be drawn at [facility] across the street from the hospital. Labs were put in the computer for the resulting agency to be [facility]. I called pt to let her know and pt verbalized understanding.

## 2020-03-01 ENCOUNTER — Other Ambulatory Visit (HOSPITAL_COMMUNITY): Payer: No Typology Code available for payment source

## 2020-03-08 ENCOUNTER — Inpatient Hospital Stay (HOSPITAL_COMMUNITY): Payer: No Typology Code available for payment source | Admitting: Nurse Practitioner

## 2020-03-11 MED FILL — OLMESARTAN-HCTZ 20-12.5 MG: 20-12.5 | 30 days supply | Qty: 30 | Fill #2

## 2020-03-15 ENCOUNTER — Encounter (HOSPITAL_COMMUNITY): Payer: Self-pay | Admitting: Nurse Practitioner

## 2020-03-15 ENCOUNTER — Other Ambulatory Visit (HOSPITAL_COMMUNITY): Payer: Self-pay | Admitting: Nurse Practitioner

## 2020-03-15 ENCOUNTER — Inpatient Hospital Stay (HOSPITAL_COMMUNITY): Payer: No Typology Code available for payment source | Attending: Nurse Practitioner | Admitting: Nurse Practitioner

## 2020-03-15 DIAGNOSIS — D0512 Intraductal carcinoma in situ of left breast: Secondary | ICD-10-CM

## 2020-03-15 NOTE — Assessment & Plan Note (Signed)
1.  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 -Status post lumpectomy on 09/22/2017, with positive inferior margins and other margins close. - Status post reexcision of lumpectomy margins on 10/19/2017, pathology showing no evidence of DCIS or invasive cancer. -Given high-grade DCIS and young age, it was recommended she have antiestrogen therapy with tamoxifen for 5 years. -She started tamoxifen 20 mg daily on 10/29/2017.  She is tolerating well. -Her last Pap smear was on 02/18/2018 which was negative. -Her last mammogram was on 08/31/2018 which was BI-RADS Category 2 benign. -Labs on 03/01/2020 done at [facility] showed her potassium 4.4, creatinine 0.81, WBC 8.5, hemoglobin 13.9, platelets 258. - She will continue taking calcium and vitamin D daily. -She will follow-up in 6 months with repeat labs and mammogram.

## 2020-04-05 ENCOUNTER — Encounter: Payer: Self-pay | Admitting: Family Medicine

## 2020-04-05 ENCOUNTER — Other Ambulatory Visit: Payer: Self-pay

## 2020-04-05 ENCOUNTER — Ambulatory Visit (INDEPENDENT_AMBULATORY_CARE_PROVIDER_SITE_OTHER): Payer: No Typology Code available for payment source | Admitting: Family Medicine

## 2020-04-05 ENCOUNTER — Ambulatory Visit: Payer: No Typology Code available for payment source | Admitting: Family Medicine

## 2020-04-05 ENCOUNTER — Other Ambulatory Visit: Payer: Self-pay | Admitting: Family Medicine

## 2020-04-05 ENCOUNTER — Ambulatory Visit: Payer: No Typology Code available for payment source | Admitting: Physician Assistant

## 2020-04-05 VITALS — BP 136/81 | HR 97 | Temp 97.3°F | Resp 16 | Ht 63.0 in | Wt 181.0 lb

## 2020-04-05 DIAGNOSIS — Z6832 Body mass index (BMI) 32.0-32.9, adult: Secondary | ICD-10-CM

## 2020-04-05 DIAGNOSIS — E6609 Other obesity due to excess calories: Secondary | ICD-10-CM | POA: Insufficient documentation

## 2020-04-05 DIAGNOSIS — D0512 Intraductal carcinoma in situ of left breast: Secondary | ICD-10-CM | POA: Diagnosis not present

## 2020-04-05 DIAGNOSIS — I10 Essential (primary) hypertension: Secondary | ICD-10-CM

## 2020-04-05 DIAGNOSIS — E66811 Other obesity due to excess calories: Secondary | ICD-10-CM

## 2020-04-05 HISTORY — DX: Body mass index (BMI) 32.0-32.9, adult: Z68.32

## 2020-04-05 HISTORY — DX: Other obesity due to excess calories: E66.09

## 2020-04-05 MED ORDER — POTASSIUM CHLORIDE ER 10 MEQ PO TBCR: 10.0000 meq | EXTENDED_RELEASE_TABLET | Freq: Every day | ORAL | 3 refills | Status: DC

## 2020-04-05 MED ORDER — OLMESARTAN MEDOXOMIL-HCTZ 20-12.5 MG PO TABS: 1.0000 | ORAL_TABLET | Freq: Every day | ORAL | 3 refills | Status: DC

## 2020-04-05 MED FILL — OLMESARTAN-HCTZ 20-12.5 MG: 20-12.5 | 90 days supply | Qty: 90 | Fill #0

## 2020-04-05 MED FILL — POTASSIUM CL ER 10 MEQ TAB: 10 | 90 days supply | Qty: 90 | Fill #0

## 2020-04-05 NOTE — Patient Instructions (Addendum)
I appreciate the opportunity to provide you with care for your health and wellness. Today we discussed: establish care   Follow up: 6 months   No labs or referrals today  Nice to meet you!  Enjoy the Fall and Winter! :)  Please continue to practice social distancing to keep you, your family, and our community safe.  If you must go out, please wear a mask and practice good handwashing.  It was a pleasure to see you and I look forward to continuing to work together on your health and well-being. Please do not hesitate to call the office if you need care or have questions about your care.  Have a wonderful day and week. With Gratitude, Cherly Beach, DNP, AGNP-BC

## 2020-04-05 NOTE — Assessment & Plan Note (Signed)
Followed by Oncology- [facility] Overall doing well. On tamoxifen.

## 2020-04-05 NOTE — Progress Notes (Signed)
Subjective:  Patient ID: Sara Freeman, female    DOB: [birth year]  Age: 49 y.o. MRN: [id number]  CC:  Chief Complaint  Patient presents with  . New Patient (Initial Visit)    new pt former [facility] pt  no issues today just needs refills       HPI  HPI   Sara Freeman is a 49 year old female who comes in today to establish care.  She is very pleasant and has good communication.  She has a history that includes but not limited to DCIS of the left breast, hypertension, obesity, ex-smoker.  She reports taking all her medications without any issue or concern.  Does need a refill on her Benicar and potassium.  Overall is doing very well blood pressure is well controlled at home.  In office.  She denies having any chest pain, leg swelling, palpitations, cough, shortness of breath, headaches, dizziness or vision changes.  She denies having any sleep trouble though she does report that if she does not take Benadryl she will wake up at 1 so in the morning and not be able to really go back into a deep sleep.  She denies having any trouble with chewing or swallowing.  Does have a few teeth have been extracted however has no other issues.  She reports she sees a Pharmacist, community regularly.  She denies having any issues with making water or bowel movements no blood in urine or stool.  She denies having any memory trouble, falls or injuries, skin issues, hearing changes or vision changes.  She does wear glasses and sees eye doctor as needed.  Recently was seen at the cancer center in August and did have some updated labs at that time.  Overall there are no issues to follow-up with at this time.  Today patient denies signs and symptoms of COVID 19 infection including fever, chills, cough, shortness of breath, and headache. Past Medical, Surgical, Social History, Allergies, and Medications have been Reviewed.   Past Medical History:  Diagnosis Date  . Allergy   . Breast cancer ([place])   . Breast  cancer ([place]) 09/09/2017  . Breast disorder    cancer  . Encounter for colorectal cancer screening 04/21/2019  . Encounter for well woman exam with routine gynecological exam 04/21/2019  . Endometriosis   . Family history of breast cancer   . Family history of heart disease in female family member before age 63 05/28/2017  . Genetic testing 09/10/2017   Patient had genetic counseling for Hereditary Predisposition to cancer on 09/09/2017. The patient declined genetic testing at this time.   . Hypertension   . Personal history of radiation therapy     Current Meds  Medication Sig  . calcium-vitamin D 250-100 MG-UNIT tablet Take 1 tablet by mouth daily. Taking calcium 600 mg plus vitamin D once daily  . olmesartan-hydrochlorothiazide (BENICAR HCT) 20-12.5 MG tablet Take 1 tablet by mouth daily.  . potassium chloride (KLOR-CON) 10 MEQ tablet Take 1 tablet (10 mEq total) by mouth daily.  . tamoxifen (NOLVADEX) 20 MG tablet TAKE 1 TABLET BY MOUTH DAILY.  . [DISCONTINUED] olmesartan-hydrochlorothiazide (BENICAR HCT) 20-12.5 MG tablet Take 1 tablet by mouth daily.  . [DISCONTINUED] potassium chloride (K-DUR) 10 MEQ tablet Take 1 tablet (10 mEq total) by mouth daily.    ROS:  Review of Systems  Constitutional: Negative.   HENT: Negative.   Eyes: Negative.   Respiratory: Negative.   Cardiovascular: Negative.   Gastrointestinal: Negative.  Genitourinary: Negative.   Musculoskeletal: Negative.   Skin: Negative.   Neurological: Negative.   Endo/Heme/Allergies: Negative.   Psychiatric/Behavioral: Negative.      Objective:   Today's Vitals: BP 136/81 (BP Location: Right Arm, Patient Position: Sitting, Cuff Size: Normal)   Pulse 97   Temp (!) 97.3 F (36.3 C) (Temporal)   Resp 16   Ht 5\' 3"  (1.6 m)   Wt 181 lb (82.1 kg)   SpO2 98%   BMI 32.06 kg/m  Vitals with BMI 04/05/2020 09/08/2019 04/21/2019  Height 5\' 3"  - 5\' 3"   Weight 181 lbs 186 lbs 180 lbs  BMI 44.31 - 54.00  Systolic 867 619  509  Diastolic 81 74 81  Pulse 97 105 88     Physical Exam Vitals and nursing note reviewed.  Constitutional:      Appearance: Normal appearance. She is well-developed and well-groomed. She is obese.  HENT:     Head: Normocephalic and atraumatic.     Right Ear: External ear normal.     Left Ear: External ear normal.     Mouth/Throat:     Comments: Mask in place  Eyes:     General:        Right eye: No discharge.        Left eye: No discharge.     Conjunctiva/sclera: Conjunctivae normal.     Comments: glasses  Cardiovascular:     Rate and Rhythm: Normal rate and regular rhythm.     Pulses: Normal pulses.     Heart sounds: Normal heart sounds.  Pulmonary:     Effort: Pulmonary effort is normal.     Breath sounds: Normal breath sounds.  Musculoskeletal:        General: Normal range of motion.     Cervical back: Normal range of motion and neck supple.  Skin:    General: Skin is warm.  Neurological:     General: No focal deficit present.     Mental Status: She is alert and oriented to person, place, and time.  Psychiatric:        Attention and Perception: Attention normal.        Mood and Affect: Mood normal.        Speech: Speech normal.        Behavior: Behavior normal. Behavior is cooperative.        Thought Content: Thought content normal.        Cognition and Memory: Cognition normal.        Judgment: Judgment normal.     Assessment   1. Class 1 obesity due to excess calories without serious comorbidity with body mass index (BMI) of 32.0 to 32.9 in adult   2. Hypertension, unspecified type   3. Ductal carcinoma in situ (DCIS) of left breast     Tests ordered No orders of the defined types were placed in this encounter.    Plan: Please see assessment and plan per problem list above.   Meds ordered this encounter  Medications  . olmesartan-hydrochlorothiazide (BENICAR HCT) 20-12.5 MG tablet    Sig: Take 1 tablet by mouth daily.    Dispense:  90 tablet      Refill:  3    Order Specific Question:   Supervising Provider    Answer:   SIMPSON, MARGARET E [[id number]]  . potassium chloride (KLOR-CON) 10 MEQ tablet    Sig: Take 1 tablet (10 mEq total) by mouth daily.    Dispense:  90 tablet  Refill:  3    Order Specific Question:   Supervising Provider    Answer:   Fayrene Helper [[id number]]    Patient to follow-up in 6 months .  Perlie Mayo, NP

## 2020-04-05 NOTE — Assessment & Plan Note (Signed)
Sara Freeman is educated about the importance of exercise daily to help with weight management. A minumum of 30 minutes daily is recommended. Additionally, importance of healthy food choices  with portion control discussed.   Wt Readings from Last 3 Encounters:  04/05/20 181 lb (82.1 kg)  09/08/19 186 lb (84.4 kg)  04/21/19 180 lb (81.6 kg)

## 2020-04-05 NOTE — Assessment & Plan Note (Signed)
Sara Freeman is encouraged to maintain a well balanced diet that is low in salt. Controlled, continue current medication regimen. Refills provided   Additionally, she is also reminded that exercise is beneficial for heart health and control of  Blood pressure. 30-60 minutes daily is recommended-walking was suggested.

## 2020-04-08 ENCOUNTER — Ambulatory Visit: Payer: No Typology Code available for payment source | Admitting: Family Medicine

## 2020-04-08 MED FILL — TAMOXIFEN 20 MG TABLET: 20 | 90 days supply | Qty: 90 | Fill #2

## 2020-04-12 ENCOUNTER — Ambulatory Visit: Payer: No Typology Code available for payment source | Admitting: Family Medicine

## 2020-04-15 ENCOUNTER — Ambulatory Visit: Payer: No Typology Code available for payment source | Admitting: Family Medicine

## 2020-07-01 MED FILL — TAMOXIFEN 20 MG TABLET: 20 | 90 days supply | Qty: 90 | Fill #3

## 2020-07-01 MED FILL — OLMESARTAN-HCTZ 20-12.5 MG: 20-12.5 | 90 days supply | Qty: 90 | Fill #1

## 2020-07-01 MED FILL — POTASSIUM CL ER 10 MEQ TAB: 10 | 90 days supply | Qty: 90 | Fill #1

## 2020-07-25 ENCOUNTER — Ambulatory Visit: Payer: No Typology Code available for payment source

## 2020-08-02 ENCOUNTER — Other Ambulatory Visit: Payer: No Typology Code available for payment source | Admitting: Adult Health

## 2020-08-14 ENCOUNTER — Other Ambulatory Visit (HOSPITAL_COMMUNITY): Payer: Self-pay

## 2020-08-14 DIAGNOSIS — D0512 Intraductal carcinoma in situ of left breast: Secondary | ICD-10-CM

## 2020-08-14 DIAGNOSIS — D500 Iron deficiency anemia secondary to blood loss (chronic): Secondary | ICD-10-CM

## 2020-08-14 DIAGNOSIS — D509 Iron deficiency anemia, unspecified: Secondary | ICD-10-CM

## 2020-09-03 ENCOUNTER — Encounter (HOSPITAL_COMMUNITY): Payer: No Typology Code available for payment source

## 2020-09-03 ENCOUNTER — Other Ambulatory Visit (HOSPITAL_COMMUNITY): Payer: No Typology Code available for payment source

## 2020-09-06 ENCOUNTER — Other Ambulatory Visit: Payer: Self-pay

## 2020-09-06 ENCOUNTER — Ambulatory Visit
Admission: RE | Admit: 2020-09-06 | Discharge: 2020-09-06 | Disposition: A | Discharge status: Home / Self Care | Payer: No Typology Code available for payment source | Source: Ambulatory Visit | Attending: Nurse Practitioner | Admitting: Nurse Practitioner

## 2020-09-06 ENCOUNTER — Ambulatory Visit (HOSPITAL_COMMUNITY): Payer: No Typology Code available for payment source | Admitting: Nurse Practitioner

## 2020-09-06 DIAGNOSIS — D0512 Intraductal carcinoma in situ of left breast: Secondary | ICD-10-CM

## 2020-09-11 ENCOUNTER — Inpatient Hospital Stay (HOSPITAL_COMMUNITY): Payer: No Typology Code available for payment source | Attending: Hematology | Admitting: Hematology

## 2020-09-11 ENCOUNTER — Other Ambulatory Visit: Payer: Self-pay

## 2020-09-11 DIAGNOSIS — D0512 Intraductal carcinoma in situ of left breast: Secondary | ICD-10-CM

## 2020-09-11 NOTE — Progress Notes (Signed)
Virtual Visit via Telephone Note  I connected with Sara Freeman on 09/11/20 at  4:15 PM EST by telephone and verified that I am speaking with the correct person using two identifiers.  Location: Patient: At home Provider: In the office   I discussed the limitations, risks, security and privacy concerns of performing an evaluation and management service by telephone and the availability of in person appointments. I also discussed with the patient that there may be a patient responsible charge related to this service. The patient expressed understanding and agreed to proceed.   History of Present Illness: She is seen in our clinic for left breast DCIS which was surgically resected by lumpectomy on 09/22/2017 which showed 1.2 cm high-grade DCIS, LCIS with no evidence of infiltrating ductal carcinoma, ER/PR positive.  Reexcision was done on 10/19/2017.   Observations/Objective: She is tolerating tamoxifen very well.  No vaginal discharge or spotting.  She is having regular.  Lasting 3 to 4 days of moderate bleeding.  She has a Pap smear scheduled on March 13.  She is also taking calcium and vitamin D supplements.  Assessment and Plan:  1.  Left breast DCIS: -Tamoxifen started on 10/29/2017.  Continue tamoxifen for 5 years. -Reviewed bilateral mammogram from 09/06/2020, BI-RADS Category 2. -Reviewed labs dated 08/30/2020 from [facility] which showed normal CBC with white count 9.8, hemoglobin 13.3, platelet count 251.  Creatinine is 0.8.  LFTs are normal. -Vitamin D is 40.8. -Continue calcium and vitamin D supplements. -RTC 6 months for follow-up with labs.   Follow Up Instructions: RTC 6 months with labs.   I discussed the assessment and treatment plan with the patient. The patient was provided an opportunity to ask questions and all were answered. The patient agreed with the plan and demonstrated an understanding of the instructions.   The patient was advised to call back or seek an in-person  evaluation if the symptoms worsen or if the condition fails to improve as anticipated.  I provided 11 minutes of non-face-to-face time during this encounter.   Derek Jack, MD

## 2020-09-13 ENCOUNTER — Ambulatory Visit (HOSPITAL_COMMUNITY): Payer: No Typology Code available for payment source | Admitting: Hematology and Oncology

## 2020-09-24 ENCOUNTER — Telehealth (INDEPENDENT_AMBULATORY_CARE_PROVIDER_SITE_OTHER): Payer: No Typology Code available for payment source | Admitting: Family Medicine

## 2020-09-24 ENCOUNTER — Encounter: Payer: Self-pay | Admitting: Family Medicine

## 2020-09-24 ENCOUNTER — Other Ambulatory Visit: Payer: Self-pay

## 2020-09-24 VITALS — Ht 62.0 in | Wt 182.0 lb

## 2020-09-24 DIAGNOSIS — I10 Essential (primary) hypertension: Secondary | ICD-10-CM | POA: Diagnosis not present

## 2020-09-24 DIAGNOSIS — Z6832 Body mass index (BMI) 32.0-32.9, adult: Secondary | ICD-10-CM | POA: Diagnosis not present

## 2020-09-24 DIAGNOSIS — E66811 Obesity, class 1: Secondary | ICD-10-CM

## 2020-09-24 DIAGNOSIS — E6609 Other obesity due to excess calories: Secondary | ICD-10-CM

## 2020-09-24 DIAGNOSIS — D0512 Intraductal carcinoma in situ of left breast: Secondary | ICD-10-CM

## 2020-09-24 NOTE — Assessment & Plan Note (Signed)
-  stable -encouraged heart health diet -encouraged 30-60 mins of exercise at least 5 days of the week -encouraged hydration with water   Wt Readings from Last 3 Encounters:  09/24/20 182 lb (82.6 kg)  09/11/20 182 lb 8 oz (82.8 kg)  04/05/20 181 lb (82.1 kg)

## 2020-09-24 NOTE — Progress Notes (Signed)
Choose 1 Note Type (Video or Telephone):    Virtual Visit via Telephone Note   This visit type was conducted due to national recommendations for restrictions regarding the COVID-19 Pandemic (e.g. social distancing) in an effort to limit this patient's exposure and mitigate transmission in our community.  Due to her co-morbid illnesses, this patient is at least at moderate risk for complications without adequate follow up.  This format is felt to be most appropriate for this patient at this time.  The patient did not have access to video technology/had technical difficulties with video requiring transitioning to audio format only (telephone).  All issues noted in this document were discussed and addressed.  No physical exam could be performed with this format.   Evaluation Performed:  Follow-up visit  Date:  09/24/2020   ID:  Sara Freeman, DOB Dec 10, 1970, MRN [id number]  Patient Location: Home Provider Location: Office/Clinic   Participants: Nurse/CMA for intake and work up; Patient and Provider for Visit and Wrap up   Method of visit: Telephone Location of Patient: Home Location of Provider: Office Consent was obtain for visit over the telephone. Services rendered by provider: Visit was performed via telephone  I verified that I am speaking with the correct person using two identifiers.  PCP:  Perlie Mayo, NP   Chief Complaint:  HTN follow up  History of Present Illness:    Sara Freeman is a 50 y.o. female with history as stated below. Overall reports doing well without issues or concern. Taking medication as directed. She does not need refills. Is followed by oncology for her breast cancer and recent labs and follow up have been good.   She denies changes in bladder or bowel habits. No blood in urine or stool. Sleep well- and no memory changes. She has not had any falls. Her mood is good.   She has no other complaints or concerns to to discuss at this time.  The patient  does not have symptoms concerning for COVID-19 infection (fever, chills, cough, or new shortness of breath).   Past Medical, Surgical, Social History, Allergies, and Medications have been Reviewed.  Past Medical History:  Diagnosis Date  . Allergy   . Breast cancer ([place])   . Breast cancer ([place]) 09/09/2017  . Breast disorder    cancer  . Encounter for colorectal cancer screening 04/21/2019  . Encounter for well woman exam with routine gynecological exam 04/21/2019  . Endometriosis   . Family history of breast cancer   . Family history of heart disease in female family member before age 71 05/28/2017  . Genetic testing 09/10/2017   Patient had genetic counseling for Hereditary Predisposition to cancer on 09/09/2017. The patient declined genetic testing at this time.   . Hypertension   . Personal history of radiation therapy    Past Surgical History:  Procedure Laterality Date  . BREAST BIOPSY Left 2017   benign  . BREAST BIOPSY Left 08/27/2017   Needle core biopsy  . BREAST LUMPECTOMY Left 10/19/2017   Procedure: LEFT BREAST RE EXCISION LUMPECTOMY;  Surgeon: Erroll Luna, MD;  Location: [facility];  Service: General;  Laterality: Left;  . BREAST LUMPECTOMY WITH RADIOACTIVE SEED LOCALIZATION Left 09/22/2017   Procedure: BREAST LUMPECTOMY  WITH RADIOACTIVE SEED LOCALIZATION X 2;  Surgeon: Erroll Luna, MD;  Location: [facility];  Service: General;  Laterality: Left;  . BREAST SURGERY N/A    Phreesia 04/02/2020  . [place]  Current Meds  Medication Sig  . calcium-vitamin D 250-100 MG-UNIT tablet Take 1 tablet by mouth daily. Taking calcium 600 mg plus vitamin D once daily  . olmesartan-hydrochlorothiazide (BENICAR HCT) 20-12.5 MG tablet Take 1 tablet by mouth daily.  . potassium chloride (KLOR-CON) 10 MEQ tablet Take 1 tablet (10 mEq total) by mouth daily.  . tamoxifen (NOLVADEX) 20 MG tablet TAKE 1 TABLET BY MOUTH DAILY.     Allergies:    Lisinopril, Penicillins, and Azithromycin   ROS:   Please see the history of present illness.    All other systems reviewed and are negative.   Labs/Other Tests and Data Reviewed:    Recent Labs: No results found for requested labs within last 8760 hours.   Wt Readings from Last 3 Encounters:  09/24/20 182 lb (82.6 kg)  09/11/20 182 lb 8 oz (82.8 kg)  04/05/20 181 lb (82.1 kg)     Objective:    Vital Signs:  Ht 5\' 2"  (1.575 m)   Wt 182 lb (82.6 kg)   BMI 33.29 kg/m    VITAL SIGNS:  reviewed GEN:  no acute distress RESPIRATORY:  no shortness of breath in conversation  PSYCH:  normal affect  ASSESSMENT & PLAN:    1. Primary hypertension -stable- taking benicar as ordered- no refills needed -exercise 30-60 mins daily recommended  -heart healthy diet encouraged   2. Class 1 obesity due to excess calories without serious comorbidity with body mass index (BMI) of 32.0 to 32.9 in adult -stable -encouraged heart health diet -encouraged 30-60 mins of exercise at least 5 days of the week -encouraged hydration with water  3. Ductal carcinoma in situ (DCIS) of left breast -being followed by oncology -reports doing well   Time:   Today, I have spent 10 minutes with the patient with telehealth technology discussing the above problems, orders, chart review, and medication review.     Medication Adjustments/Labs and Tests Ordered: Current medicines are reviewed at length with the patient today.  Concerns regarding medicines are outlined above.   Tests Ordered: No orders of the defined types were placed in this encounter.   Medication Changes: No orders of the defined types were placed in this encounter.    Disposition:  Follow up Sept for CPE  Signed, MARCEIL WELP, NP  09/24/2020 8:39 AM     [place]

## 2020-09-24 NOTE — Patient Instructions (Signed)
I appreciate the opportunity to provide you with care for your health and wellness.  Follow up: Sept for CPE- fasting labs same day   No labs or referrals today  I hope you have a great Spring and Summer!  Call if you need anything.  Please continue to practice social distancing to keep you, your family, and our community safe.  If you must go out, please wear a mask and practice good handwashing.  It was a pleasure to see you and I look forward to continuing to work together on your health and well-being. Please do not hesitate to call the office if you need care or have questions about your care.  Have a wonderful day. With Gratitude, Cherly Beach, DNP, AGNP-BC

## 2020-09-24 NOTE — Assessment & Plan Note (Signed)
-  stable- taking benicar as ordered- no refills needed -exercise 30-60 mins daily recommended  -heart healthy diet encouraged

## 2020-09-24 NOTE — Assessment & Plan Note (Signed)
-  being followed by oncology -reports doing well

## 2020-09-27 ENCOUNTER — Encounter: Payer: Self-pay | Admitting: Adult Health

## 2020-09-27 ENCOUNTER — Ambulatory Visit (INDEPENDENT_AMBULATORY_CARE_PROVIDER_SITE_OTHER): Payer: No Typology Code available for payment source | Admitting: Adult Health

## 2020-09-27 ENCOUNTER — Other Ambulatory Visit: Payer: Self-pay

## 2020-09-27 ENCOUNTER — Other Ambulatory Visit (HOSPITAL_COMMUNITY)
Admission: RE | Admit: 2020-09-27 | Discharge: 2020-09-27 | Disposition: A | Discharge status: Home / Self Care | Payer: No Typology Code available for payment source | Source: Ambulatory Visit | Attending: Adult Health | Admitting: Adult Health

## 2020-09-27 VITALS — BP 129/80 | HR 92 | Ht 63.0 in | Wt 185.0 lb

## 2020-09-27 DIAGNOSIS — Z1211 Encounter for screening for malignant neoplasm of colon: Secondary | ICD-10-CM | POA: Insufficient documentation

## 2020-09-27 DIAGNOSIS — D0512 Intraductal carcinoma in situ of left breast: Secondary | ICD-10-CM | POA: Diagnosis not present

## 2020-09-27 DIAGNOSIS — Z0001 Encounter for general adult medical examination with abnormal findings: Secondary | ICD-10-CM | POA: Insufficient documentation

## 2020-09-27 DIAGNOSIS — Z01419 Encounter for gynecological examination (general) (routine) without abnormal findings: Secondary | ICD-10-CM | POA: Diagnosis present

## 2020-09-27 HISTORY — DX: Encounter for general adult medical examination with abnormal findings: Z00.01

## 2020-09-27 HISTORY — DX: Encounter for gynecological examination (general) (routine) without abnormal findings: Z01.419

## 2020-09-27 LAB — HEMOCCULT GUIAC POC 1CARD (OFFICE): Fecal Occult Blood, POC: NEGATIVE

## 2020-09-27 NOTE — Progress Notes (Signed)
Patient ID: Sara Freeman, female   DOB: 08-Sep-1970, 50 y.o.   MRN: [id number] History of Present Illness: Sara Freeman is a 50 year old white female,married, G1P1 in for well woman gyn exam and pap. She is still working.  PCP is Sara Beach NP   Current Medications, Allergies, Past Medical History, Past Surgical History, Family History and Social History were reviewed in [employer] record.     Review of Systems: Patient denies any headaches, hearing loss, fatigue, blurred vision, shortness of breath, chest pain, abdominal pain, problems with bowel movements, urination, or intercourse. No joint pain or mood swings. Has period about every 28 days    Physical Exam:BP 129/80 (BP Location: Left Arm, Patient Position: Sitting, Cuff Size: Normal)   Pulse 92   Ht 5\' 3"  (1.6 m)   Wt 185 lb (83.9 kg)   LMP 09/13/2020 (Exact Date)   BMI 32.77 kg/m  General:  Well developed, well nourished, no acute distress Skin:  Warm and dry Neck:  Midline trachea, normal thyroid, good ROM, no lymphadenopathy Lungs; Clear to auscultation bilaterally Breast:  No dominant palpable mass, retraction, or nipple discharge Cardiovascular: Regular rate and rhythm Abdomen:  Soft, non tender, no hepatosplenomegaly Pelvic:  External genitalia is normal in appearance, no lesions.  The vagina is normal in appearance. Urethra has no lesions or masses. The cervix is smooth, pap with HRHPV genotyping performed.  Uterus is felt to be normal size, shape, and contour.  No adnexal masses or tenderness noted.Bladder is non tender, no masses felt. Rectal: Good sphincter tone, no polyps, or hemorrhoids felt.  Hemoccult negative. Extremities/musculoskeletal:  No swelling or varicosities noted, no clubbing or cyanosis Psych:  No mood changes, alert and cooperative,seems happy AA is 0 Fall risk is low PHQ 9 score is 0 GAD 7 score is 0  [facility] - 09/27/20 1224      Pregnancy Intention Screening   Does the patient  want to become pregnant in the next year? No    Does the patient's partner want to become pregnant in the next year? No    Would the patient like to discuss contraceptive options today? No      Contraception Wrap Up   Current Method Female Sterilization    End Method Female Sterilization    Contraception Counseling Provided No         Examination chaperoned by Celene Squibb LPN  Impression and Plan: 1. Encounter for well woman exam with routine gynecological exam  2. Encounter for gynecological examination with Papanicolaou smear of cervix Pap sent Physical and pap in 1 year Labs with PCP Mammogram yearly Colonoscopy at 50  3. Ductal carcinoma in situ (DCIS) of left breast Mammogram yearly   4. Encounter for screening fecal occult blood testing

## 2020-09-30 LAB — CYTOLOGY - PAP
Comment: NEGATIVE
Diagnosis: NEGATIVE
High risk HPV: NEGATIVE

## 2020-10-04 ENCOUNTER — Ambulatory Visit: Payer: No Typology Code available for payment source | Admitting: Family Medicine

## 2020-10-07 ENCOUNTER — Other Ambulatory Visit (HOSPITAL_COMMUNITY): Payer: Self-pay | Admitting: Hematology

## 2020-10-07 DIAGNOSIS — D0512 Intraductal carcinoma in situ of left breast: Secondary | ICD-10-CM

## 2020-10-07 MED FILL — OLMESARTAN-HCTZ 20-12.5 MG: 20-12.5 | 90 days supply | Qty: 90 | Fill #2

## 2020-10-07 MED FILL — POTASSIUM CL ER 10 MEQ TAB: 10 | 90 days supply | Qty: 90 | Fill #2

## 2020-10-07 MED FILL — TAMOXIFEN 20 MG TABLET: 20 | 90 days supply | Qty: 90 | Fill #0

## 2020-10-09 ENCOUNTER — Other Ambulatory Visit (HOSPITAL_BASED_OUTPATIENT_CLINIC_OR_DEPARTMENT_OTHER): Payer: Self-pay

## 2020-11-27 ENCOUNTER — Telehealth: Payer: No Typology Code available for payment source | Admitting: Family

## 2020-11-27 DIAGNOSIS — L255 Unspecified contact dermatitis due to plants, except food: Secondary | ICD-10-CM

## 2020-11-27 MED ORDER — PREDNISONE 10 MG PO TABS: ORAL_TABLET | ORAL | 0 refills | Status: DC

## 2020-11-27 NOTE — Progress Notes (Signed)
E-Visit for Poison Ivy  We are sorry that you are not feeing well.  Here is how we plan to help!  Based on what you have shared with me it looks like you have had an allergic reaction to the oily resin from a group of plants.  This resin is very sticky, so it easily attaches to your skin, clothing, tools equipment, and pet's fur.    This blistering rash is often called poison ivy rash although it can come from contact with the leaves, stems and roots of poison ivy, poison oak and poison sumac.  The oily resin contains urushiol (u-ROO-she-ol) that produces a skin rash on exposed skin.  The severity of the rash depends on the amount of urushiol that gets on your skin.  A section of skin with more urushiol on it may develop a rash sooner.  The rash usually develops 12-48 hours after exposure and can last two to three weeks.  Your skin must come in direct contact with the plant's oil to be affected.  Blister fluid doesn't spread the rash.  However, if you come into contact with a piece of clothing or pet fur that has urushiol on it, the rash may spread out.  You can also transfer the oil to other parts of your body with your fingers.  Often the rash looks like a straight line because of the way the plant brushes against your skin.  Since your rash is widespread or has resulted in a large number of blisters, I have prescribed an oral corticosteroid.  Please follow these recommendations:  I have sent a prednisone dose pack to your chosen pharmacy. Be sure to follow the instructions carefully and complete the entire prescription. You may use Benadryl or Caladryl topical lotions to sooth the itch and remember cool, not hot, showers and baths can help relieve the itching!  Place cool, wet compresses on the affected area for 15-30 minutes several times a day.  You may also take oral antihistamines, such as diphenhydramine (Benadryl, others), which may also help you sleep better.  Watch your skin for any purulent  (pus) drainage or red streaking from the site.  If this occurs, contact your provider.  You may require an antibiotic for a skin infection.  Make sure that the clothes you were wearing as well as any towels or sheets that may have come in contact with the oil (urushiol) are washed in detergent and hot water.       I have developed the following plan to treat your condition I am prescribing a two week course of steroids (37 tablets of 10 mg prednisone).  Days 1-4 take 4 tablets (40 mg) daily  Days 5-8 take 3 tablets (30 mg) daily, Days 9-11 take 2 tablets (20 mg) daily, Days 12-14 take 1 tablet (10 mg) daily.    What can you do to prevent this rash?  Avoid the plants.  Learn how to identify poison ivy, poison oak and poison sumac in all seasons.  When hiking or engaging in other activities that might expose you to these plants, try to stay on cleared pathways.  If camping, make sure you pitch your tent in an area free of these plants.  Keep pets from running through wooded areas so that urushiol doesn't accidentally stick to their fur, which you may touch.  Remove or kill the plants.  In your yard, you can get rid of poison ivy by applying an herbicide or pulling it out of   the ground, including the roots, while wearing heavy gloves.  Afterward remove the gloves and thoroughly wash them and your hands.  Don't burn poison ivy or related plants because the urushiol can be carried by smoke.  Wear protective clothing.  If needed, protect your skin by wearing socks, boots, pants, long sleeves and vinyl gloves.  Wash your skin right away.  Washing off the oil with soap and water within 30 minutes of exposure may reduce your chances of getting a poison ivy rash.  Even washing after an hour or so can help reduce the severity of the rash.  If you walk through some poison ivy and then later touch your shoes, you may get some urushiol on your hands, which may then transfer to your face or body by touching or  rubbing.  If the contaminated object isn't cleaned, the urushiol on it can still cause a skin reaction years later.    Be careful not to reuse towels after you have washed your skin.  Also carefully wash clothing in detergent and hot water to remove all traces of the oil.  Handle contaminated clothing carefully so you don't transfer the urushiol to yourself, furniture, rugs or appliances.  Remember that pets can carry the oil on their fur and paws.  If you think your pet may be contaminated with urushiol, put on some long rubber gloves and give your pet a bath.  Finally, be careful not to burn these plants as the smoke can contain traces of the oil.  Inhaling the smoke may result in difficulty breathing. If that occurred you should see a physician as soon as possible.  See your doctor right away if:   The reaction is severe or widespread  You inhaled the smoke from burning poison ivy and are having difficulty breathing  Your skin continues to swell  The rash affects your eyes, mouth or genitals  Blisters are oozing pus  You develop a fever greater than 100 F (37.8 C)  The rash doesn't get better within a few weeks.  If you scratch the poison ivy rash, bacteria under your fingernails may cause the skin to become infected.  See your doctor if pus starts oozing from the blisters.  Treatment generally includes antibiotics.  Poison ivy treatments are usually limited to self-care methods.  And the rash typically goes away on its own in two to three weeks.     If the rash is widespread or results in a large number of blisters, your doctor may prescribe an oral corticosteroid, such as prednisone.  If a bacterial infection has developed at the rash site, your doctor may give you a prescription for an oral antibiotic.  MAKE SURE YOU   Understand these instructions.  Will watch your condition.  Will get help right away if you are not doing well or get worse.  Thank you for choosing an  e-visit. Your e-visit answers were reviewed by a board certified advanced clinical practitioner to complete your personal care plan. Depending upon the condition, your plan could have included both over the counter or prescription medications.  Please review your pharmacy choice. If there is a problem you may use MyChart messaging to have the prescription routed to another pharmacy.   Your safety is important to us. If you have drug allergies check your prescription carefully.  You can use MyChart to ask questions about today's visit, request a non-urgent call back, or ask for a work or school excuse for 24 hours   related to this e-Visit. If it has been greater than 24 hours you will need to follow up with your provider, or enter a new e-Visit to address those concerns.   You will get an email in the next two days asking about your experience. I hope that your e-visit has been valuable and will speed your recovery Thank you for choosing an e-visit.    Approximately 5 minutes was spent documenting and reviewing patient's chart.

## 2020-12-17 ENCOUNTER — Other Ambulatory Visit (HOSPITAL_COMMUNITY): Payer: Self-pay

## 2021-01-06 ENCOUNTER — Other Ambulatory Visit (HOSPITAL_COMMUNITY): Payer: Self-pay

## 2021-01-06 MED FILL — Potassium Chloride Tab ER 10 mEq: ORAL | 90 days supply | Qty: 90 | Fill #0 | Status: AC

## 2021-01-06 MED FILL — Olmesartan Medoxomil-Hydrochlorothiazide Tab 20-12.5 MG: ORAL | 90 days supply | Qty: 90 | Fill #0 | Status: AC

## 2021-01-06 MED FILL — Tamoxifen Citrate Tab 20 MG (Base Equivalent): ORAL | 90 days supply | Qty: 90 | Fill #0 | Status: AC

## 2021-02-27 ENCOUNTER — Other Ambulatory Visit (HOSPITAL_COMMUNITY): Payer: Self-pay

## 2021-02-27 DIAGNOSIS — D500 Iron deficiency anemia secondary to blood loss (chronic): Secondary | ICD-10-CM

## 2021-02-27 DIAGNOSIS — D0512 Intraductal carcinoma in situ of left breast: Secondary | ICD-10-CM

## 2021-03-05 ENCOUNTER — Encounter (HOSPITAL_COMMUNITY): Payer: Self-pay

## 2021-03-07 ENCOUNTER — Inpatient Hospital Stay (HOSPITAL_COMMUNITY): Payer: No Typology Code available for payment source | Attending: Hematology

## 2021-03-07 DIAGNOSIS — Z7952 Long term (current) use of systemic steroids: Secondary | ICD-10-CM | POA: Insufficient documentation

## 2021-03-07 DIAGNOSIS — D0512 Intraductal carcinoma in situ of left breast: Secondary | ICD-10-CM | POA: Insufficient documentation

## 2021-03-07 DIAGNOSIS — Z79810 Long term (current) use of selective estrogen receptor modulators (SERMs): Secondary | ICD-10-CM | POA: Insufficient documentation

## 2021-03-07 DIAGNOSIS — Z79899 Other long term (current) drug therapy: Secondary | ICD-10-CM | POA: Insufficient documentation

## 2021-03-12 NOTE — Progress Notes (Signed)
[facility] [address], [place] [address]   Patient Care Team: Perlie Mayo, NP as PCP - General (Family Medicine)  CHIEF COMPLIANT: Follow-up of DCIS of left breast   INTERVAL HISTORY: Sara Freeman is a 50 y.o. female here today for follow up of her left breast DCIS. Her last visit was on 09/11/20.   Today she reports feeling well. She reports that she has been taking tamoxifen and tolerating it well. Her last menses was at the end of July, and she denies vaginal discharge and spotting. Her last pap smear was on March 2022.   REVIEW OF SYSTEMS:   Review of Systems  Constitutional:  Negative for appetite change and fatigue.  Genitourinary:  Negative for vaginal bleeding and vaginal discharge.   All other systems reviewed and are negative.  I have reviewed the past medical history, past surgical history, social history and family history with the patient and they are unchanged from previous note.   ALLERGIES:   is allergic to lisinopril, penicillins, and azithromycin.   MEDICATIONS:  Current Outpatient Medications  Medication Sig Dispense Refill   calcium-vitamin D 250-100 MG-UNIT tablet Take 1 tablet by mouth daily. Taking calcium 600 mg plus vitamin D once daily     olmesartan-hydrochlorothiazide (BENICAR HCT) 20-12.5 MG tablet TAKE 1 TABLET BY MOUTH ONCE A DAY 90 tablet 3   potassium chloride (KLOR-CON) 10 MEQ tablet TAKE 1 TABLET BY MOUTH ONCE A DAY 90 tablet 3   predniSONE (DELTASONE) 10 MG tablet Days 1-4 take 4 tablets (40 mg) daily  Days 5-8 take 3 tablets (30 mg) daily, Days 9-11 take 2 tablets (20 mg) daily, Days 12-14 take 1 tablet (10 mg) daily. 37 tablet 0   tamoxifen (NOLVADEX) 20 MG tablet TAKE 1 TABLET BY MOUTH ONCE A DAY 90 tablet 3   No current facility-administered medications for this visit.     PHYSICAL EXAMINATION: Performance status (ECOG): 1 - Symptomatic but completely ambulatory  There were no vitals filed for this  visit. Wt Readings from Last 3 Encounters:  09/27/20 185 lb (83.9 kg)  09/24/20 182 lb (82.6 kg)  09/11/20 182 lb 8 oz (82.8 kg)   Physical Exam Vitals reviewed.  Constitutional:      Appearance: Normal appearance.  Cardiovascular:     Rate and Rhythm: Normal rate and regular rhythm.     Pulses: Normal pulses.     Heart sounds: Normal heart sounds.  Pulmonary:     Effort: Pulmonary effort is normal.     Breath sounds: Normal breath sounds.  Chest:  Breasts:    Right: No inverted nipple, mass or nipple discharge.     Left: No inverted nipple, mass, nipple discharge or skin change (lumpectomy scar in lower quadrant well healed).  Musculoskeletal:     Right lower leg: No edema.     Left lower leg: No edema.  Neurological:     General: No focal deficit present.     Mental Status: She is alert and oriented to person, place, and time.  Psychiatric:        Mood and Affect: Mood normal.        Behavior: Behavior normal.    Breast Exam Chaperone: Thana Ates     LABORATORY DATA:  I have reviewed the data as listed CMP Latest Ref Rng & Units 08/25/2019 01/13/2019 09/02/2018  Glucose 65 - 99 mg/dL 80 87 -  BUN 6 - 24 mg/dL 10 13 -  Creatinine 0.57 - 1.00 mg/dL 0.77 0.84 -  Sodium 134 - 144 mmol/L 139 139 -  Potassium 3.5 - 5.2 mmol/L 4.0 4.2 3.6  Chloride 96 - 106 mmol/L 100 102 -  CO2 20 - 29 mmol/L 23 24 -  Calcium 8.7 - 10.2 mg/dL 10.1 9.3 -  Total Protein 6.0 - 8.5 g/dL 6.9 6.3 -  Total Bilirubin 0.0 - 1.2 mg/dL <0.2 0.3 -  Alkaline Phos 39 - 117 IU/L 50 44 -  AST 0 - 40 IU/L 13 15 -  ALT 0 - 32 IU/L 9 9 -   No results found for: [id number] Lab Results  Component Value Date   WBC 6.8 08/25/2019   HGB 13.3 08/25/2019   HCT 39.4 08/25/2019   MCV 85 08/25/2019   PLT 262 08/25/2019   NEUTROABS 4.1 08/25/2019    ASSESSMENT:  1.  Left breast DCIS: - 1.2 cm high-grade DCIS, LCIS with no evidence of infiltrating ductal carcinoma, ER/PR positive, status post lumpectomy on  09/22/2017, with positive inferior margin and other margins close - Status post reexcision of lumpectomy margins on 10/19/2017, pathology showing no evidence of DCIS or invasive cancer -Given high-grade DCIS and young age, I have recommended antiestrogen therapy with tamoxifen for 5 years. - Tamoxifen started on 10/29/2017. - Last mammogram on 09/06/2020, BI-RADS Category 2.   PLAN:  Left breast DCIS: - She is tolerating tamoxifen reasonably well. - Last menstrual period was end of July.  No spotting. - Last Pap smear is in March 2022 were normal. - Breast exam today did not reveal any palpable masses.  Left breast lumpectomy site under the areola is within normal limits. - Reviewed labs from [facility] from 03/07/2021 which showed normal CBC.  LFTs were normal.  Vitamin D was 38.5.  Continue calcium and vitamin D supplements. - RTC 6 months for follow-up with repeat physical exam and labs. - We will also schedule mammogram around February 2023.  Breast Cancer therapy associated bone loss: I have recommended calcium, Vitamin D and weight bearing exercises.  Orders placed this encounter:  No orders of the defined types were placed in this encounter.   The patient has a good understanding of the overall plan. She agrees with it. She will call with any problems that may develop before the next visit here.  Derek Jack, MD [place] [phone]   I, Thana Ates, am acting as a scribe for Dr. Derek Jack.  I, Derek Jack MD, have reviewed the above documentation for accuracy and completeness, and I agree with the above.

## 2021-03-13 ENCOUNTER — Other Ambulatory Visit: Payer: Self-pay

## 2021-03-13 ENCOUNTER — Inpatient Hospital Stay (HOSPITAL_BASED_OUTPATIENT_CLINIC_OR_DEPARTMENT_OTHER): Payer: No Typology Code available for payment source | Admitting: Hematology

## 2021-03-13 ENCOUNTER — Other Ambulatory Visit (HOSPITAL_COMMUNITY): Payer: Self-pay | Admitting: *Deleted

## 2021-03-13 VITALS — BP 107/70 | HR 91 | Temp 98.6°F | Resp 16 | Wt 189.0 lb

## 2021-03-13 DIAGNOSIS — Z79899 Other long term (current) drug therapy: Secondary | ICD-10-CM | POA: Diagnosis not present

## 2021-03-13 DIAGNOSIS — Z7952 Long term (current) use of systemic steroids: Secondary | ICD-10-CM | POA: Diagnosis not present

## 2021-03-13 DIAGNOSIS — D0512 Intraductal carcinoma in situ of left breast: Secondary | ICD-10-CM

## 2021-03-13 DIAGNOSIS — Z79810 Long term (current) use of selective estrogen receptor modulators (SERMs): Secondary | ICD-10-CM | POA: Diagnosis not present

## 2021-03-13 NOTE — Patient Instructions (Addendum)
[place] at [facility] Discharge Instructions  You were seen today by Dr. Delton Coombes. He went over your recent results. You will be scheduled for a mammogram after 09/06/2021. Dr. Delton Coombes will see you back in 6 months for labs and follow up.   Thank you for choosing [place] at [facility] to provide your oncology and hematology care.  To afford each patient quality time with our provider, please arrive at least 15 minutes before your scheduled appointment time.   If you have a lab appointment with the [facility] please come in thru the Main Entrance and check in at the main information desk  You need to re-schedule your appointment should you arrive 10 or more minutes late.  We strive to give you quality time with our providers, and arriving late affects you and other patients whose appointments are after yours.  Also, if you no show three or more times for appointments you may be dismissed from the clinic at the providers discretion.     Again, thank you for choosing [facility].  Our hope is that these requests will decrease the amount of time that you wait before being seen by our physicians.       _____________________________________________________________  Should you have questions after your visit to [facility], please contact our office at [phone] between the hours of 8:00 a.m. and 4:30 p.m.  Voicemails left after 4:00 p.m. will not be returned until the following business day.  For prescription refill requests, have your pharmacy contact our office and allow 72 hours.    Cancer Center Support Programs:   > Cancer Support Group  2nd Tuesday of the month 1pm-2pm, Journey Room

## 2021-03-14 ENCOUNTER — Other Ambulatory Visit (HOSPITAL_COMMUNITY): Payer: Self-pay | Admitting: *Deleted

## 2021-03-14 ENCOUNTER — Other Ambulatory Visit (HOSPITAL_COMMUNITY): Payer: Self-pay | Admitting: Hematology

## 2021-03-14 DIAGNOSIS — D0512 Intraductal carcinoma in situ of left breast: Secondary | ICD-10-CM

## 2021-03-14 DIAGNOSIS — D500 Iron deficiency anemia secondary to blood loss (chronic): Secondary | ICD-10-CM

## 2021-03-28 ENCOUNTER — Ambulatory Visit (INDEPENDENT_AMBULATORY_CARE_PROVIDER_SITE_OTHER): Payer: No Typology Code available for payment source | Admitting: Nurse Practitioner

## 2021-03-28 ENCOUNTER — Encounter: Payer: No Typology Code available for payment source | Admitting: Family Medicine

## 2021-03-28 ENCOUNTER — Other Ambulatory Visit: Payer: Self-pay

## 2021-03-28 ENCOUNTER — Encounter: Payer: Self-pay | Admitting: Nurse Practitioner

## 2021-03-28 VITALS — BP 126/72 | HR 88 | Temp 98.3°F | Ht 63.0 in | Wt 189.0 lb

## 2021-03-28 DIAGNOSIS — Z0001 Encounter for general adult medical examination with abnormal findings: Secondary | ICD-10-CM

## 2021-03-28 DIAGNOSIS — I10 Essential (primary) hypertension: Secondary | ICD-10-CM

## 2021-03-28 NOTE — Progress Notes (Signed)
Established Patient Office Visit  Subjective:  Patient ID: Sara Freeman, female    DOB: 1970/12/02  Age: 50 y.o. MRN: [id number]  CC:  Chief Complaint  Patient presents with   Annual Exam    CPE    HPI Sara Freeman presents for physical exam.  No acute concerns.  Past Medical History:  Diagnosis Date   Allergy    Breast cancer ([place])    Breast cancer ([place]) 09/09/2017   Breast disorder    cancer   Encounter for colorectal cancer screening 04/21/2019   Encounter for well woman exam with routine gynecological exam 04/21/2019   Endometriosis    Family history of breast cancer    Family history of heart disease in female family member before age 61 05/28/2017   Genetic testing 09/10/2017   Patient had genetic counseling for Hereditary Predisposition to cancer on 09/09/2017. The patient declined genetic testing at this time.    Hypertension    Personal history of radiation therapy     Past Surgical History:  Procedure Laterality Date   BREAST BIOPSY Left 2017   benign   BREAST BIOPSY Left 08/27/2017   Needle core biopsy   BREAST LUMPECTOMY Left 10/19/2017   Procedure: LEFT BREAST RE EXCISION LUMPECTOMY;  Surgeon: Erroll Luna, MD;  Location: [facility];  Service: General;  Laterality: Left;   BREAST LUMPECTOMY WITH RADIOACTIVE SEED LOCALIZATION Left 09/22/2017   Procedure: BREAST LUMPECTOMY  WITH RADIOACTIVE SEED LOCALIZATION X 2;  Surgeon: Erroll Luna, MD;  Location: [facility];  Service: General;  Laterality: Left;   BREAST SURGERY N/A    [employer] 04/02/2020   TUBAL LIGATION  1999   [facility]    Family History  Problem Relation Age of Onset   CAD Other    Heart disease Other    Stroke Paternal Grandfather    Stroke Paternal Grandmother    Aneurysm Maternal Grandfather    Heart attack Father    Hypertension Father    COPD Father    Heart disease Father        50 died   Thyroid disease Mother    Hypertension Mother    Hypertension Brother    Thyroid  disease Son    Stroke Paternal Uncle    Breast cancer Other 15       metastatic    Social History   Socioeconomic History   Marital status: Married    Spouse name: [name]   Number of children: 1   Years of education: 14   Highest education level: Associate degree: academic program  Occupational History   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    Comment: Dr Laural Golden  Tobacco Use   Smoking status: Former    Packs/day: 1.00    Types: Cigarettes    Start date: 07/20/1986    Quit date: 07/20/2006    Years since quitting: 14.6   Smokeless tobacco: Never  Vaping Use   Vaping Use: Never used  Substance and Sexual Activity   Alcohol use: No   Drug use: No   Sexual activity: Yes    Birth control/protection: Surgical    Comment: tubal  Other Topics Concern   Not on file  Social History Narrative   Lives with husband [name] married 43 years    Son lives close by is married-step grandchildren      Dogs: Counsellor and Flip       Enjoys: Likes to read- genres: romance, christian novels; Goes camping once a month  Diet: eats all food groups    Caffeine: 1 -2 cups of coffee daily    Water: some 1-2 daily       Wears seat belt   Does not use phone while driving   Smoke Forensic scientist     Social Determinants of Health   Financial Resource Strain: Low Risk    Difficulty of Paying Living Expenses: Not hard at all  Food Insecurity: No Food Insecurity   Worried About Charity fundraiser in the Last Year: Never true   Arboriculturist in the Last Year: Never true  Transportation Needs: No Transportation Needs   Lack of Transportation (Medical): No   Lack of Transportation (Non-Medical): No  Physical Activity: Inactive   Days of Exercise per Week: 0 days   Minutes of Exercise per Session: 0 min  Stress: No Stress Concern Present   Feeling of Stress : Not at all  Social Connections: Socially Integrated   Frequency of Communication with Friends and Family:  Three times a week   Frequency of Social Gatherings with Friends and Family: Once a week   Attends Religious Services: More than 4 times per year   Active Member of [employer] or Organizations: Yes   Attends Music therapist: More than 4 times per year   Marital Status: Married  Human resources officer Violence: Not At Risk   Fear of Current or Ex-Partner: No   Emotionally Abused: No   Physically Abused: No   Sexually Abused: No    Outpatient Medications Prior to Visit  Medication Sig Dispense Refill   calcium-vitamin D 250-100 MG-UNIT tablet Take 1 tablet by mouth daily. Taking calcium 600 mg plus vitamin D once daily     olmesartan-hydrochlorothiazide (BENICAR HCT) 20-12.5 MG tablet TAKE 1 TABLET BY MOUTH ONCE A DAY 90 tablet 3   potassium chloride (KLOR-CON) 10 MEQ tablet TAKE 1 TABLET BY MOUTH ONCE A DAY 90 tablet 3   tamoxifen (NOLVADEX) 20 MG tablet TAKE 1 TABLET BY MOUTH ONCE A DAY 90 tablet 3   predniSONE (DELTASONE) 10 MG tablet Days 1-4 take 4 tablets (40 mg) daily  Days 5-8 take 3 tablets (30 mg) daily, Days 9-11 take 2 tablets (20 mg) daily, Days 12-14 take 1 tablet (10 mg) daily. 37 tablet 0   No facility-administered medications prior to visit.    Allergies  Allergen Reactions   Lisinopril Cough   Penicillins Other (See Comments)    UNSPECIFIED REACTION OF CHILDHOOD Has patient had a PCN reaction causing immediate rash, facial/tongue/throat swelling, SOB or lightheadedness with hypotension: Unknown Has patient had a PCN reaction causing severe rash involving mucus membranes or skin necrosis: Unknown Has patient had a PCN reaction that required hospitalization: Unknown Has patient had a PCN reaction occurring within the last 10 years: Unknown If all of the above answers are "NO", then may proceed with Cephalosporin use.    Azithromycin Itching    Itching of skin without rash    ROS Review of Systems  Constitutional: Negative.   HENT: Negative.    Eyes:  Negative.   Respiratory: Negative.    Cardiovascular: Negative.   Gastrointestinal: Negative.   Endocrine: Negative.   Genitourinary: Negative.   Musculoskeletal: Negative.   Skin: Negative.   Allergic/Immunologic: Negative.   Neurological: Negative.   Hematological: Negative.   Psychiatric/Behavioral: Negative.       Objective:    Physical Exam Constitutional:  Appearance: Normal appearance. She is obese.  HENT:     Head: Normocephalic and atraumatic.     Right Ear: Tympanic membrane, ear canal and external ear normal.     Left Ear: Tympanic membrane, ear canal and external ear normal.     Nose: Nose normal.     Mouth/Throat:     Mouth: Mucous membranes are moist.     Pharynx: Oropharynx is clear.  Eyes:     Extraocular Movements: Extraocular movements intact.     Conjunctiva/sclera: Conjunctivae normal.     Pupils: Pupils are equal, round, and reactive to light.  Cardiovascular:     Rate and Rhythm: Normal rate and regular rhythm.     Pulses: Normal pulses.     Heart sounds: Normal heart sounds.  Pulmonary:     Effort: Pulmonary effort is normal.     Breath sounds: Normal breath sounds.  Abdominal:     General: Abdomen is flat. Bowel sounds are normal.     Palpations: Abdomen is soft.  Musculoskeletal:        General: Normal range of motion.     Cervical back: Normal range of motion and neck supple.  Skin:    General: Skin is warm and dry.     Capillary Refill: Capillary refill takes less than 2 seconds.  Neurological:     General: No focal deficit present.     Mental Status: She is alert and oriented to person, place, and time.     Cranial Nerves: No cranial nerve deficit.     Sensory: No sensory deficit.     Motor: No weakness.     Coordination: Coordination normal.     Gait: Gait normal.  Psychiatric:        Mood and Affect: Mood normal.        Behavior: Behavior normal.        Thought Content: Thought content normal.        Judgment: Judgment  normal.    BP 126/72 (BP Location: Left Arm, Patient Position: Sitting, Cuff Size: Large)   Pulse 88   Temp 98.3 F (36.8 C) (Oral)   Ht '5\' 3"'$  (1.6 m)   Wt 189 lb (85.7 kg)   LMP 03/23/2021 (Exact Date)   SpO2 98%   BMI 33.48 kg/m  Wt Readings from Last 3 Encounters:  03/28/21 189 lb (85.7 kg)  03/13/21 189 lb (85.7 kg)  09/27/20 185 lb (83.9 kg)     There are no preventive care reminders to display for this patient.   There are no preventive care reminders to display for this patient.  Lab Results  Component Value Date   TSH 1.09 05/28/2017   Lab Results  Component Value Date   WBC 6.8 08/25/2019   HGB 13.3 08/25/2019   HCT 39.4 08/25/2019   MCV 85 08/25/2019   PLT 262 08/25/2019   Lab Results  Component Value Date   NA 139 08/25/2019   K 4.0 08/25/2019   CO2 23 08/25/2019   GLUCOSE 80 08/25/2019   BUN 10 08/25/2019   CREATININE 0.77 08/25/2019   BILITOT <0.2 08/25/2019   ALKPHOS 50 08/25/2019   AST 13 08/25/2019   ALT 9 08/25/2019   PROT 6.9 08/25/2019   ALBUMIN 4.2 08/25/2019   CALCIUM 10.1 08/25/2019   ANIONGAP 9 05/20/2018   Lab Results  Component Value Date   CHOL 188 01/13/2019   Lab Results  Component Value Date   HDL 66 01/13/2019  Lab Results  Component Value Date   LDLCALC 94 01/13/2019   Lab Results  Component Value Date   TRIG 140 01/13/2019   Lab Results  Component Value Date   CHOLHDL 2.8 01/13/2019   No results found for: HGBA1C    Assessment & Plan:   Problem List Items Addressed This Visit       Cardiovascular and Mediastinum   HTN (hypertension)    BP Readings from Last 3 Encounters:  03/28/21 126/72  03/13/21 107/70  09/27/20 129/80  -well controlled; no change to meds today        Other   Encounter for general adult medical examination with abnormal findings - Primary    -discussed care caps/screenings -she will consider colonoscopy/cologuard and let us know where she would like referral by her 50th  birthday -she gets CBC and CMP with Dr. Delton Coombes, so we will get TSH and lipid panel as soon as possible      Relevant Orders   TSH   Lipid Panel With LDL/HDL Ratio    No orders of the defined types were placed in this encounter.   Follow-up: Return in about 6 months (around 09/25/2021) for Follow-up (HTN).    Noreene Larsson, NP

## 2021-03-28 NOTE — Assessment & Plan Note (Signed)
BP Readings from Last 3 Encounters:  03/28/21 126/72  03/13/21 107/70  09/27/20 129/80   -well controlled; no change to meds today

## 2021-03-28 NOTE — Assessment & Plan Note (Addendum)
-  discussed care caps/screenings -she will consider colonoscopy/cologuard and let us know where she would like referral by her 50th birthday -she gets CBC and CMP with Dr. Delton Coombes, so we will get TSH and lipid panel as soon as possible

## 2021-04-06 ENCOUNTER — Other Ambulatory Visit: Payer: Self-pay | Admitting: Family Medicine

## 2021-04-06 DIAGNOSIS — I10 Essential (primary) hypertension: Secondary | ICD-10-CM

## 2021-04-06 MED FILL — Tamoxifen Citrate Tab 20 MG (Base Equivalent): ORAL | 90 days supply | Qty: 90 | Fill #1 | Status: AC

## 2021-04-07 ENCOUNTER — Other Ambulatory Visit (HOSPITAL_COMMUNITY): Payer: Self-pay

## 2021-04-08 ENCOUNTER — Other Ambulatory Visit (HOSPITAL_COMMUNITY): Payer: Self-pay

## 2021-04-09 ENCOUNTER — Encounter: Payer: Self-pay | Admitting: Nurse Practitioner

## 2021-04-10 ENCOUNTER — Other Ambulatory Visit: Payer: Self-pay | Admitting: *Deleted

## 2021-04-10 ENCOUNTER — Other Ambulatory Visit (HOSPITAL_COMMUNITY): Payer: Self-pay

## 2021-04-10 DIAGNOSIS — I10 Essential (primary) hypertension: Secondary | ICD-10-CM

## 2021-04-10 MED ORDER — POTASSIUM CHLORIDE ER 10 MEQ PO TBCR
EXTENDED_RELEASE_TABLET | Freq: Every day | ORAL | 3 refills | Status: DC
  Filled 2021-04-10: qty 90, 90d supply, fill #0
  Filled 2021-06-29: qty 90, 90d supply, fill #1
  Filled 2021-09-28: qty 90, 90d supply, fill #2
  Filled 2021-12-21: qty 90, 90d supply, fill #3

## 2021-04-10 MED ORDER — OLMESARTAN MEDOXOMIL-HCTZ 20-12.5 MG PO TABS
1.0000 | ORAL_TABLET | Freq: Every day | ORAL | 3 refills | Status: DC
  Filled 2021-04-10: qty 90, 90d supply, fill #0
  Filled 2021-06-29: qty 90, 90d supply, fill #1
  Filled 2021-09-28: qty 90, 90d supply, fill #2
  Filled 2021-12-21: qty 90, 90d supply, fill #3

## 2021-04-12 LAB — LIPID PANEL WITH LDL/HDL RATIO
Cholesterol, Total: 180 mg/dL (ref 100–199)
HDL: 62 mg/dL (ref >39)
LDL Chol Calc (NIH): 91 mg/dL (ref 0–99)
LDL/HDL Ratio: 1.5 ratio (ref 0.0–3.2)
Triglycerides: 155 mg/dL — ABNORMAL HIGH (ref 0–149)
VLDL Cholesterol Cal: 27 mg/dL (ref 5–40)

## 2021-04-12 LAB — TSH: TSH: 1.8 u[IU]/mL (ref 0.450–4.500)

## 2021-04-14 NOTE — Progress Notes (Signed)
Thyroid looks great. Lipids are good.

## 2021-06-29 MED FILL — Tamoxifen Citrate Tab 20 MG (Base Equivalent): ORAL | 90 days supply | Qty: 90 | Fill #2 | Status: AC

## 2021-06-30 ENCOUNTER — Other Ambulatory Visit (HOSPITAL_COMMUNITY): Payer: Self-pay

## 2021-09-01 ENCOUNTER — Other Ambulatory Visit: Payer: Self-pay | Admitting: Hematology

## 2021-09-01 DIAGNOSIS — D0512 Intraductal carcinoma in situ of left breast: Secondary | ICD-10-CM

## 2021-09-03 ENCOUNTER — Other Ambulatory Visit (HOSPITAL_COMMUNITY): Payer: Self-pay | Admitting: *Deleted

## 2021-09-03 DIAGNOSIS — D0512 Intraductal carcinoma in situ of left breast: Secondary | ICD-10-CM

## 2021-09-03 DIAGNOSIS — D500 Iron deficiency anemia secondary to blood loss (chronic): Secondary | ICD-10-CM

## 2021-09-12 ENCOUNTER — Ambulatory Visit
Admission: RE | Admit: 2021-09-12 | Discharge: 2021-09-12 | Disposition: A | Discharge status: Home / Self Care | Payer: No Typology Code available for payment source | Source: Ambulatory Visit | Attending: Hematology | Admitting: Hematology

## 2021-09-12 ENCOUNTER — Other Ambulatory Visit: Payer: Self-pay

## 2021-09-12 ENCOUNTER — Ambulatory Visit (HOSPITAL_COMMUNITY): Payer: No Typology Code available for payment source

## 2021-09-12 DIAGNOSIS — D0512 Intraductal carcinoma in situ of left breast: Secondary | ICD-10-CM

## 2021-09-17 ENCOUNTER — Other Ambulatory Visit: Payer: Self-pay

## 2021-09-17 ENCOUNTER — Inpatient Hospital Stay (HOSPITAL_COMMUNITY): Payer: No Typology Code available for payment source | Attending: Hematology | Admitting: Hematology

## 2021-09-17 VITALS — BP 119/72 | HR 84 | Temp 98.2°F | Resp 18 | Ht 63.0 in | Wt 190.6 lb

## 2021-09-17 DIAGNOSIS — Z79810 Long term (current) use of selective estrogen receptor modulators (SERMs): Secondary | ICD-10-CM | POA: Insufficient documentation

## 2021-09-17 DIAGNOSIS — D0512 Intraductal carcinoma in situ of left breast: Secondary | ICD-10-CM | POA: Diagnosis not present

## 2021-09-17 DIAGNOSIS — R232 Flushing: Secondary | ICD-10-CM | POA: Insufficient documentation

## 2021-09-17 DIAGNOSIS — Z79899 Other long term (current) drug therapy: Secondary | ICD-10-CM | POA: Insufficient documentation

## 2021-09-17 NOTE — Patient Instructions (Addendum)
[facility] at [facility] ?Discharge Instructions ? ?You were seen and examined today by Dr. Delton Coombes. He reviewed your most recent mammogram and it looks normal. Continue taking the tamoxifen, Vitamin D, and Calcium. Please keep follow up appointment as scheduled in 6 months.  ? ? ?Thank you for choosing [place] at [facility] to provide your oncology and hematology care.  To afford each patient quality time with our provider, please arrive at least 15 minutes before your scheduled appointment time.  ? ?If you have a lab appointment with the [facility] please come in thru the Main Entrance and check in at the main information desk. ? ?You need to re-schedule your appointment should you arrive 10 or more minutes late.  We strive to give you quality time with our providers, and arriving late affects you and other patients whose appointments are after yours.  Also, if you no show three or more times for appointments you may be dismissed from the clinic at the providers discretion.     ?Again, thank you for choosing [facility].  Our hope is that these requests will decrease the amount of time that you wait before being seen by our physicians.       ?_____________________________________________________________ ? ?Should you have questions after your visit to [facility], please contact our office at [phone] and follow the prompts.  Our office hours are 8:00 a.m. and 4:30 p.m. Monday - Friday.  Please note that voicemails left after 4:00 p.m. may not be returned until the following business day.  We are closed weekends and major holidays.  You do have access to a nurse 24-7, just call the main number to the clinic [phone] and do not press any options, hold on the line and a nurse will answer the phone.   ? ?For prescription refill requests, have your pharmacy contact our office and allow 72 hours.   ? ?Due to Covid, you will  need to wear a mask upon entering the hospital. If you do not have a mask, a mask will be given to you at the Main Entrance upon arrival. For doctor visits, patients may have 1 support person age 88 or older with them. For treatment visits, patients can not have anyone with them due to social distancing guidelines and our immunocompromised population.  ? ?  ?

## 2021-09-17 NOTE — Progress Notes (Signed)
? ?[facility] ?[address] ?[place], [place] [address] ? ? ?Patient Care Team: ?Renee Rival, FNP as PCP - General (Nurse Practitioner) ? ?SUMMARY OF ONCOLOGIC HISTORY: ?Oncology History  ? No history exists.  ? ? ?CHIEF COMPLIANT: Follow-up of DCIS of left breast ? ? ?INTERVAL HISTORY: [name] is a 51 y.o. female here today for follow up of her DCIS of left breast. Her last visit was on 03/13/2021.  ? ?Today she reports feeling good. She is taking tamoxifen and tolerating it well. She continues to have menses. She reports hot flashes in the evening, and she denies hair thinning and mood changes.  ? ?REVIEW OF SYSTEMS:   ?Review of Systems  ?Constitutional:  Negative for appetite change and fatigue.  ?Endocrine: Positive for hot flashes.  ?All other systems reviewed and are negative. ? ?I have reviewed the past medical history, past surgical history, social history and family history with the patient and they are unchanged from previous note. ? ? ?ALLERGIES:   ?is allergic to lisinopril, penicillins, and azithromycin. ? ? ?MEDICATIONS:  ?Current Outpatient Medications  ?Medication Sig Dispense Refill  ? calcium-vitamin D 250-100 MG-UNIT tablet Take 1 tablet by mouth daily. Taking calcium 600 mg plus vitamin D once daily    ? olmesartan-hydrochlorothiazide (BENICAR HCT) 20-12.5 MG tablet TAKE 1 TABLET BY MOUTH ONCE A DAY 90 tablet 3  ? potassium chloride (KLOR-CON) 10 MEQ tablet TAKE 1 TABLET BY MOUTH ONCE A DAY 90 tablet 3  ? tamoxifen (NOLVADEX) 20 MG tablet TAKE 1 TABLET BY MOUTH ONCE A DAY 90 tablet 3  ? ?No current facility-administered medications for this visit.  ? ? ? ?PHYSICAL EXAMINATION: ?Performance status (ECOG): 1 - Symptomatic but completely ambulatory ? ?Vitals:  ? 09/17/21 1550  ?BP: 119/72  ?Pulse: 84  ?Resp: 18  ?Temp: 98.2 ?F (36.8 ?C)  ?SpO2: 100%  ? ?Wt Readings from Last 3 Encounters:  ?09/17/21 190 lb 9.6 oz (86.5 kg)  ?03/28/21 189 lb (85.7 kg)  ?03/13/21 189 lb  (85.7 kg)  ? ?Physical Exam ?Vitals reviewed.  ?Constitutional:   ?   Appearance: Normal appearance. She is obese.  ?Cardiovascular:  ?   Rate and Rhythm: Normal rate and regular rhythm.  ?   Pulses: Normal pulses.  ?   Heart sounds: Normal heart sounds.  ?Pulmonary:  ?   Effort: Pulmonary effort is normal.  ?   Breath sounds: Normal breath sounds.  ?Chest:  ?Breasts: ?   Right: Normal. No swelling, bleeding, inverted nipple, mass, nipple discharge, skin change or tenderness.  ?   Left: No swelling, bleeding, inverted nipple, mass, nipple discharge, skin change (lower quadrant lumpectomy scar WNL) or tenderness.  ?Abdominal:  ?   Palpations: Abdomen is soft. There is no hepatomegaly, splenomegaly or mass.  ?   Tenderness: There is no abdominal tenderness.  ?Musculoskeletal:  ?   Right lower leg: No edema.  ?   Left lower leg: No edema.  ?Lymphadenopathy:  ?   Upper Body:  ?   Right upper body: No supraclavicular or axillary adenopathy.  ?   Left upper body: No supraclavicular or axillary adenopathy.  ?Neurological:  ?   General: No focal deficit present.  ?   Mental Status: She is alert and oriented to person, place, and time.  ?Psychiatric:     ?   Mood and Affect: Mood normal.     ?   Behavior: Behavior normal.  ? ? ?  Breast Exam Chaperone: Thana Ates   ? ? ?LABORATORY DATA:  ?I have reviewed the data as listed ?CMP Latest Ref Rng & Units 08/25/2019 01/13/2019 09/02/2018  ?Glucose 65 - 99 mg/dL 80 87 -  ?BUN 6 - 24 mg/dL 10 13 -  ?Creatinine 0.57 - 1.00 mg/dL 0.77 0.84 -  ?Sodium 134 - 144 mmol/L 139 139 -  ?Potassium 3.5 - 5.2 mmol/L 4.0 4.2 3.6  ?Chloride 96 - 106 mmol/L 100 102 -  ?CO2 20 - 29 mmol/L 23 24 -  ?Calcium 8.7 - 10.2 mg/dL 10.1 9.3 -  ?Total Protein 6.0 - 8.5 g/dL 6.9 6.3 -  ?Total Bilirubin 0.0 - 1.2 mg/dL <0.2 0.3 -  ?Alkaline Phos 39 - 117 IU/L 50 44 -  ?AST 0 - 40 IU/L 13 15 -  ?ALT 0 - 32 IU/L 9 9 -  ? ?No results found for: [id number] ?Lab Results  ?Component Value Date  ? WBC 6.8 08/25/2019  ? HGB  13.3 08/25/2019  ? HCT 39.4 08/25/2019  ? MCV 85 08/25/2019  ? PLT 262 08/25/2019  ? NEUTROABS 4.1 08/25/2019  ? ? ?ASSESSMENT:  ?1.  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 ?- Tamoxifen started on 10/29/2017. ?- Last mammogram on 09/06/2020, BI-RADS Category 2. ? ? ?PLAN:  ?Left breast DCIS: ?- She is tolerating tamoxifen very well. ?- She is scheduled for Pap smear later this month.  She is continuing to menstruate. ?- Reviewed mammogram from 09/15/2021, BI-RADS Category 1. ?- Physical examination today shows lumpectomy scar in the left breast lower quadrant with no clearly palpable masses in bilateral breast.  No palpable adenopathy. ?- Reviewed labs dated 09/12/2021 from [facility].  WBC 8.8, hemoglobin 13.7, platelets 250.  LFTs and creatinine normal.  Vitamin D was 36. ?- Continue tamoxifen.  Continue calcium plus vitamin D daily. ?- RTC 6 months for follow-up. ? ?Breast Cancer therapy associated bone loss: I have recommended calcium, Vitamin D and weight bearing exercises. ? ?Orders placed this encounter:  ?No orders of the defined types were placed in this encounter. ? ? ?The patient has a good understanding of the overall plan. She agrees with it. She will call with any problems that may develop before the next visit here. ? ?Derek Jack, MD ?[facility] ?[phone] ? ? ?I, Thana Ates, am acting as a scribe for Dr. Derek Jack. ? ?I, Derek Jack MD, have reviewed the above documentation for accuracy and completeness, and I agree with the above. ?  ? ? ?

## 2021-09-26 ENCOUNTER — Ambulatory Visit: Payer: No Typology Code available for payment source | Admitting: Nurse Practitioner

## 2021-09-28 ENCOUNTER — Other Ambulatory Visit (HOSPITAL_COMMUNITY): Payer: Self-pay | Admitting: Hematology

## 2021-09-28 DIAGNOSIS — D0512 Intraductal carcinoma in situ of left breast: Secondary | ICD-10-CM

## 2021-09-29 ENCOUNTER — Other Ambulatory Visit (HOSPITAL_COMMUNITY): Payer: Self-pay

## 2021-09-29 MED ORDER — TAMOXIFEN CITRATE 20 MG PO TABS
ORAL_TABLET | Freq: Every day | ORAL | 3 refills | Status: DC
  Filled 2021-09-29: qty 90, 90d supply, fill #0
  Filled 2021-12-21: qty 90, 90d supply, fill #1
  Filled 2022-03-23: qty 90, 90d supply, fill #2
  Filled 2022-06-21: qty 90, 90d supply, fill #3

## 2021-10-03 ENCOUNTER — Ambulatory Visit: Payer: No Typology Code available for payment source | Admitting: Nurse Practitioner

## 2021-10-03 ENCOUNTER — Other Ambulatory Visit: Payer: No Typology Code available for payment source | Admitting: Adult Health

## 2021-10-10 ENCOUNTER — Other Ambulatory Visit: Payer: No Typology Code available for payment source | Admitting: Adult Health

## 2021-10-17 ENCOUNTER — Ambulatory Visit (INDEPENDENT_AMBULATORY_CARE_PROVIDER_SITE_OTHER): Payer: No Typology Code available for payment source | Admitting: Family

## 2021-10-17 ENCOUNTER — Encounter: Payer: Self-pay | Admitting: Family

## 2021-10-17 VITALS — BP 120/82 | HR 85 | Temp 98.3°F | Ht 62.5 in | Wt 187.8 lb

## 2021-10-17 DIAGNOSIS — I10 Essential (primary) hypertension: Secondary | ICD-10-CM | POA: Diagnosis not present

## 2021-10-17 NOTE — Assessment & Plan Note (Addendum)
Chronic - pt stable on current regimen, last labs done one month ago by oncology, pt states they check labs q36m lipid panel & potassium wnl. pt does not need refills today, will let me know, f/u in 6 mos. ?

## 2021-10-17 NOTE — Progress Notes (Signed)
? ?New Patient Office Visit ? ?Subjective:  ?Patient ID: Sara Freeman, female    DOB: 10/06/1970  Age: 51 y.o. MRN: [id number] ? ?CC:  ?Chief Complaint  ?Patient presents with  ? Establish Care  ?  Pt has no complaints today.  She is not fasting, as she had a piece of toast w/jelly.  ? Hypertension  ? ?HPI ?Sara Freeman presents for establishing care and to discuss one problem. ? ?Hypertension: Patient is currently maintained on the following medications for blood pressure: Olmesartan-HCTZ ?Failed meds include: none ?Patient reports good compliance with blood pressure medications. ?Patient denies chest pain, headaches, shortness of breath or swelling. ?Last 3 blood pressure readings in our office are as follows: ?BP Readings from Last 3 Encounters:  ?10/17/21 120/82  ?09/17/21 119/72  ?03/28/21 126/72  ?  ? ?Past Medical History:  ?Diagnosis Date  ? Allergy   ? Breast cancer ([employer])   ? Breast cancer ([place]) 09/09/2017  ? Breast disorder   ? cancer  ? Class 1 obesity due to excess calories without serious comorbidity with body mass index (BMI) of 32.0 to 32.9 in adult 04/05/2020  ? Encounter for colorectal cancer screening 04/21/2019  ? Encounter for general adult medical examination with abnormal findings 09/27/2020  ? Encounter for gynecological examination with Papanicolaou smear of cervix 09/27/2020  ? Encounter for well woman exam with routine gynecological exam 04/21/2019  ? Endometriosis   ? Family history of breast cancer   ? Family history of heart disease in female family member before age 69 05/28/2017  ? Genetic testing 09/10/2017  ? Patient had genetic counseling for Hereditary Predisposition to cancer on 09/09/2017. The patient declined genetic testing at this time.   ? Hypertension   ? Personal history of radiation therapy   ? ? ?Past Surgical History:  ?Procedure Laterality Date  ? BREAST BIOPSY Left 2017  ? benign  ? BREAST BIOPSY Left 08/27/2017  ? Needle core biopsy  ? BREAST LUMPECTOMY Left 10/19/2017  ?  Procedure: LEFT BREAST RE EXCISION LUMPECTOMY;  Surgeon: Erroll Luna, MD;  Location: [facility];  Service: General;  Laterality: Left;  ? BREAST LUMPECTOMY WITH RADIOACTIVE SEED LOCALIZATION Left 09/22/2017  ? Procedure: BREAST LUMPECTOMY  WITH RADIOACTIVE SEED LOCALIZATION X 2;  Surgeon: Erroll Luna, MD;  Location: [place];  Service: General;  Laterality: Left;  ? BREAST SURGERY N/A   ? [employer] 04/02/2020  ? TUBAL LIGATION  1999  ? [facility]  ? ? ?Family History  ?Problem Relation Age of Onset  ? CAD Other   ? Heart disease Other   ? Stroke Paternal Grandfather   ? Stroke Paternal Grandmother   ? Aneurysm Maternal Grandfather   ? Heart attack Father   ? Hypertension Father   ? COPD Father   ? Heart disease Father   ?     42 died  ? Thyroid disease Mother   ? Hypertension Mother   ? Hypertension Brother   ? Thyroid disease Son   ? Stroke Paternal Uncle   ? Breast cancer Other 23  ?     metastatic  ? ? ?Social History  ? ?Socioeconomic History  ? Marital status: Married  ?  Spouse name: Sara Freeman  ? Number of children: 1  ? Years of education: 21  ? Highest education level: Associate degree: academic program  ?Occupational History  ? 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  Comment: Dr Laural Golden  ?Tobacco Use  ? Smoking status: Former  ?  Packs/day: 1.00  ?  Types: Cigarettes  ?  Start date: 07/20/1986  ?  Quit date: 07/20/2006  ?  Years since quitting: 15.2  ? Smokeless tobacco: Never  ?Vaping Use  ? Vaping Use: Never used  ?Substance and Sexual Activity  ? Alcohol use: No  ? Drug use: No  ? Sexual activity: Yes  ?  Birth control/protection: Surgical  ?  Comment: tubal  ?Other Topics Concern  ? Not on file  ?Social History Narrative  ? Lives with husband Sara Freeman married 30 years   ? Son lives close by is married-step grandchildren  ?   ? Dogs: Sara Freeman and Sara Freeman   ?   ? Enjoys: Likes to read- genres: romance, christian novels; Goes camping once a month  ?   ? Diet: eats all food groups   ? Caffeine: 1 -2 cups of coffee  daily   ? Water: some 1-2 daily   ?   ? Wears seat belt  ? Does not use phone while driving  ? Smoke detectors   ? Data processing manager  ? Sara Freeman    ? ?Social Determinants of Health  ? ?Financial Resource Strain: Not on file  ?Food Insecurity: Not on file  ?Transportation Needs: Not on file  ?Physical Activity: Not on file  ?Stress: Not on file  ?Social Connections: Not on file  ?Intimate Partner Violence: Not on file  ? ? ?Objective:  ? ?Today's Vitals: BP 120/82 (BP Location: Left Arm, Patient Position: Sitting, Cuff Size: Normal)   Pulse 85   Temp 98.3 ?F (36.8 ?C)   Ht 5' 2.5" (1.588 m)   Wt 187 lb 12.8 oz (85.2 kg)   LMP 10/12/2021   SpO2 99%   BMI 33.80 kg/m?  ? ?Physical Exam ?Vitals and nursing note reviewed.  ?Constitutional:   ?   Appearance: Normal appearance. She is obese.  ?Cardiovascular:  ?   Rate and Rhythm: Normal rate and regular rhythm.  ?Pulmonary:  ?   Effort: Pulmonary effort is normal.  ?   Breath sounds: Normal breath sounds.  ?Musculoskeletal:     ?   General: Normal range of motion.  ?Skin: ?   General: Skin is warm and dry.  ?Neurological:  ?   Mental Status: She is alert.  ?Psychiatric:     ?   Mood and Affect: Mood normal.     ?   Behavior: Behavior normal.  ? ? ?Assessment & Plan:  ? ?Problem List Items Addressed This Visit   ? ?  ? Cardiovascular and Mediastinum  ? HTN (hypertension) - Primary  ?  Chronic - pt stable on current regimen, last labs done one month ago by oncology, pt states they check labs q69m lipid panel & potassium wnl. pt does not need refills today, will let me know, f/u in 6 mos. ?  ?  ? ? ?Outpatient Encounter Medications as of 10/17/2021  ?Medication Sig  ? calcium-vitamin D 250-100 MG-UNIT tablet Take 1 tablet by mouth daily. Taking calcium 600 mg plus vitamin D once daily  ? olmesartan-hydrochlorothiazide (BENICAR HCT) 20-12.5 MG tablet TAKE 1 TABLET BY MOUTH ONCE A DAY  ? potassium chloride (KLOR-CON) 10 MEQ tablet TAKE 1 TABLET BY MOUTH ONCE A DAY  ?  tamoxifen (NOLVADEX) 20 MG tablet TAKE 1 TABLET BY MOUTH ONCE A DAY  ? ?No facility-administered encounter medications on file as of 10/17/2021.  ? ? ?Follow-up: Return in about 6 months (around 04/18/2022).  ? ?HJeanie Sewer NP ?

## 2021-10-17 NOTE — Patient Instructions (Addendum)
Welcome to [facility]! It was a pleasure meeting you today. ? ?As discussed, I would increase your Vitamin D3 up to 2,000 units daily. You can add an extra 1,000unit capsule daily to your current Calcium/D3 supplement. ?Let me know via MyChart when you need your potassium and blood pressure medicine refilled. ? ?Please schedule a 6 month follow up visit today or 6 mos after you request your refills. ? ?Have a great weekend :-) ? ? ? ?PLEASE NOTE: ? ?If you had any LAB tests please let us know if you have not heard back within a few days. You may see your results on MyChart before we have a chance to review them but we will give you a call once they are reviewed by [place]. If we ordered any REFERRALS today, please let us know if you have not heard from their office within the next week.  ?Let us know through MyChart if you are needing REFILLS, or have your pharmacy send [place] the request. You can also use MyChart to communicate with me or any office staff. ? ?Please try these tips to maintain a healthy lifestyle: ? ?Eat most of your calories during the day when you are active. Eliminate processed foods including packaged sweets (pies, cakes, cookies), reduce intake of potatoes, white bread, white pasta, and white rice. Look for whole grain options, oat flour or almond flour. ? ?Each meal should contain half fruits/vegetables, one quarter protein, and one quarter carbs (no bigger than a computer mouse). ? ?Cut down on sweet beverages. This includes juice, soda, and sweet tea. Also watch fruit intake, though this is a healthier sweet option, it still contains natural sugar! Limit to 3 servings daily. ? ?Drink at least 1 glass of water with each meal and aim for at least 8 glasses per day ? ?Exercise at least 150 minutes every week.  ? ?

## 2021-11-14 ENCOUNTER — Other Ambulatory Visit (HOSPITAL_COMMUNITY)
Admission: RE | Admit: 2021-11-14 | Discharge: 2021-11-14 | Disposition: A | Discharge status: Home / Self Care | Payer: No Typology Code available for payment source | Source: Ambulatory Visit | Attending: Adult Health | Admitting: Adult Health

## 2021-11-14 ENCOUNTER — Ambulatory Visit (INDEPENDENT_AMBULATORY_CARE_PROVIDER_SITE_OTHER): Payer: No Typology Code available for payment source | Admitting: Adult Health

## 2021-11-14 ENCOUNTER — Ambulatory Visit: Payer: No Typology Code available for payment source | Admitting: Nurse Practitioner

## 2021-11-14 ENCOUNTER — Encounter: Payer: Self-pay | Admitting: Adult Health

## 2021-11-14 VITALS — BP 126/77 | HR 89 | Ht 61.5 in | Wt 186.5 lb

## 2021-11-14 DIAGNOSIS — Z01419 Encounter for gynecological examination (general) (routine) without abnormal findings: Secondary | ICD-10-CM | POA: Diagnosis present

## 2021-11-14 DIAGNOSIS — D0512 Intraductal carcinoma in situ of left breast: Secondary | ICD-10-CM | POA: Diagnosis not present

## 2021-11-14 DIAGNOSIS — Z1211 Encounter for screening for malignant neoplasm of colon: Secondary | ICD-10-CM

## 2021-11-14 HISTORY — DX: Encounter for screening for malignant neoplasm of colon: Z12.11

## 2021-11-14 LAB — HEMOCCULT GUIAC POC 1CARD (OFFICE): Fecal Occult Blood, POC: NEGATIVE

## 2021-11-14 NOTE — Progress Notes (Signed)
Patient ID: Sara Freeman, female   DOB: Jan 01, 1971, 51 y.o.   MRN: [id number] ?History of Present Illness: ?Sara Freeman is a 51 year old white female, married, G1P1 in for well woman gyn exam and pap. She is still working and still has period very month, cycle may vary some. ?PCP is [name] NP ? ? ?Current Medications, Allergies, Past Medical History, Past Surgical History, Family History and Social History were reviewed in [facility] record.   ? ? ?Review of Systems: ?Patient denies any headaches, hearing loss, fatigue, blurred vision, shortness of breath, chest pain, abdominal pain, problems with bowel movements, urination, or intercourse. No joint pain or mood swings.  ?Has some hot flashes  ? ? ?Physical Exam:BP 126/77 (BP Location: Left Arm, Patient Position: Sitting, Cuff Size: Normal)   Pulse 89   Ht 5' 1.5" (1.562 m)   Wt 186 lb 8 oz (84.6 kg)   LMP 10/12/2021   BMI 34.67 kg/m?   ?General:  Well developed, well nourished, no acute distress ?Skin:  Warm and dry,tan ?Neck:  Midline trachea, normal thyroid, good ROM, no lymphadenopathy ?Lungs; Clear to auscultation bilaterally ?Breast:  No dominant palpable mass, retraction, or nipple discharge, has some thickening at scar left breast  ?Cardiovascular: Regular rate and rhythm ?Abdomen:  Soft, non tender, no hepatosplenomegaly ?Pelvic:  External genitalia is normal in appearance, no lesions.  The vagina is normal in appearance. Urethra has no lesions or masses. The cervix is bulbous.Pap with HR HPV genotyping performed.  Uterus is felt to be normal size, shape, and contour.  No adnexal masses or tenderness noted.Bladder is non tender, no masses felt. ?Rectal: Good sphincter tone, no polyps, or hemorrhoids felt.  Hemoccult negative. ?Extremities/musculoskeletal:  No swelling or varicosities noted, no clubbing or cyanosis ?Psych:  No mood changes, alert and cooperative,seems happy ?AA is 0 ?Fall risk is low ? ?  11/14/2021  ? 12:38 PM  03/28/2021  ? 11:26 AM 09/27/2020  ? 12:23 PM  ?Depression screen PHQ 2/9  ?Decreased Interest 0 0 0  ?Down, Depressed, Hopeless 0 0 0  ?PHQ - 2 Score 0 0 0  ?Altered sleeping 0  0  ?Tired, decreased energy 0  0  ?Change in appetite 0  0  ?Feeling bad or failure about yourself  0  0  ?Trouble concentrating 0  0  ?Moving slowly or fidgety/restless 0  0  ?Suicidal thoughts 0  0  ?PHQ-9 Score 0  0  ?  ? ?  11/14/2021  ? 12:38 PM 09/27/2020  ? 12:23 PM  ?GAD 7 : Generalized Anxiety Score  ?Nervous, Anxious, on Edge 0 0  ?Control/stop worrying 0 0  ?Worry too much - different things 0 0  ?Trouble relaxing 0 0  ?Restless 0 0  ?Easily annoyed or irritable 0 0  ?Afraid - awful might happen 0 0  ?Total GAD 7 Score 0 0  ? ?  ? [facility] - 11/14/21 1244   ? ?  ? Pregnancy Intention Screening  ? Does the patient want to become pregnant in the next year? No   ? Does the patient's partner want to become pregnant in the next year? No   ? Would the patient like to discuss contraceptive options today? No   ?  ? Contraception Wrap Up  ? Current Method Female Sterilization   ? End Method Female Sterilization   ? Contraception Counseling Provided No   ? ?  ?  ? ?  ?  ?  Examination chaperoned by Levy Pupa LPN  ? ? ? ?Impression and plan:  ? ?1. Encounter for gynecological examination with Papanicolaou smear of cervix ?Pap sent ?Physical and pap in 1 year  ?Labs with Dr Raliegh Ip or PCP ?She has not done colonoscopy yet  ? ?2. Encounter for screening fecal occult blood testing ?Hemoccult negative  ? ?3. Ductal carcinoma in situ (DCIS) of left breast ?Mammogram yearly ?Has been on tamoxifen 4 years now  ? ?  ?  ?

## 2021-11-19 LAB — CYTOLOGY - PAP
Comment: NEGATIVE
Diagnosis: NEGATIVE
High risk HPV: NEGATIVE

## 2021-12-22 ENCOUNTER — Other Ambulatory Visit (HOSPITAL_COMMUNITY): Payer: Self-pay

## 2022-02-08 ENCOUNTER — Encounter (HOSPITAL_COMMUNITY): Payer: Self-pay

## 2022-02-09 NOTE — Telephone Encounter (Signed)
May you speak to this?

## 2022-03-04 ENCOUNTER — Other Ambulatory Visit: Payer: Self-pay | Admitting: *Deleted

## 2022-03-04 DIAGNOSIS — D0512 Intraductal carcinoma in situ of left breast: Secondary | ICD-10-CM

## 2022-03-04 DIAGNOSIS — D500 Iron deficiency anemia secondary to blood loss (chronic): Secondary | ICD-10-CM

## 2022-03-04 NOTE — Progress Notes (Signed)
Lab requisitions faxed to [phone], per patient's request.

## 2022-03-09 ENCOUNTER — Encounter: Payer: Self-pay | Admitting: Family

## 2022-03-14 ENCOUNTER — Telehealth: Payer: No Typology Code available for payment source | Admitting: Nurse Practitioner

## 2022-03-14 DIAGNOSIS — B3731 Acute candidiasis of vulva and vagina: Secondary | ICD-10-CM

## 2022-03-14 MED ORDER — FLUCONAZOLE 150 MG PO TABS: 150.0000 mg | ORAL_TABLET | Freq: Once | ORAL | 0 refills | Status: AC

## 2022-03-14 NOTE — Progress Notes (Signed)
I have spent 5 minutes in review of e-visit questionnaire, review and updating patient chart, medical decision making and response to patient.  ° °Anahla Bevis W Marshayla Mitschke, NP ° °  °

## 2022-03-14 NOTE — Progress Notes (Signed)

## 2022-03-16 ENCOUNTER — Other Ambulatory Visit: Payer: Self-pay | Admitting: Adult Health

## 2022-03-16 MED ORDER — FLUCONAZOLE 150 MG PO TABS: ORAL_TABLET | ORAL | 1 refills | Status: DC

## 2022-03-16 NOTE — Progress Notes (Signed)
Rx diflucan.  

## 2022-03-23 ENCOUNTER — Encounter: Payer: Self-pay | Admitting: Family

## 2022-03-23 ENCOUNTER — Other Ambulatory Visit (HOSPITAL_COMMUNITY): Payer: Self-pay

## 2022-03-23 DIAGNOSIS — I10 Essential (primary) hypertension: Secondary | ICD-10-CM

## 2022-03-24 ENCOUNTER — Other Ambulatory Visit (HOSPITAL_COMMUNITY): Payer: Self-pay

## 2022-03-24 MED ORDER — POTASSIUM CHLORIDE ER 10 MEQ PO TBCR
EXTENDED_RELEASE_TABLET | Freq: Every day | ORAL | 1 refills | Status: DC
  Filled 2022-03-24: qty 90, 90d supply, fill #0
  Filled 2022-06-21: qty 90, 90d supply, fill #1

## 2022-03-24 MED ORDER — OLMESARTAN MEDOXOMIL-HCTZ 20-12.5 MG PO TABS
1.0000 | ORAL_TABLET | Freq: Every day | ORAL | 1 refills | Status: DC
  Filled 2022-03-24: qty 90, 90d supply, fill #0

## 2022-03-24 NOTE — Telephone Encounter (Signed)
Please advise if you want me to refill.

## 2022-03-25 ENCOUNTER — Other Ambulatory Visit (HOSPITAL_COMMUNITY): Payer: Self-pay

## 2022-03-25 ENCOUNTER — Ambulatory Visit: Payer: No Typology Code available for payment source | Admitting: Hematology

## 2022-04-01 ENCOUNTER — Other Ambulatory Visit: Payer: Self-pay | Admitting: Adult Health

## 2022-04-01 MED ORDER — FLUCONAZOLE 150 MG PO TABS
ORAL_TABLET | ORAL | 1 refills | Status: DC
Start: 2022-04-01 — End: 2022-11-27

## 2022-04-01 NOTE — Progress Notes (Signed)
Refill diflucan

## 2022-04-13 ENCOUNTER — Encounter: Payer: Self-pay | Admitting: *Deleted

## 2022-04-17 ENCOUNTER — Encounter: Payer: Self-pay | Admitting: Family

## 2022-04-17 ENCOUNTER — Ambulatory Visit (INDEPENDENT_AMBULATORY_CARE_PROVIDER_SITE_OTHER): Payer: No Typology Code available for payment source | Admitting: Family

## 2022-04-17 ENCOUNTER — Other Ambulatory Visit (HOSPITAL_COMMUNITY): Payer: Self-pay

## 2022-04-17 VITALS — BP 122/74 | HR 96 | Temp 98.0°F | Ht 63.0 in | Wt 187.8 lb

## 2022-04-17 DIAGNOSIS — E559 Vitamin D deficiency, unspecified: Secondary | ICD-10-CM

## 2022-04-17 DIAGNOSIS — I10 Essential (primary) hypertension: Secondary | ICD-10-CM | POA: Diagnosis not present

## 2022-04-17 DIAGNOSIS — Z862 Personal history of diseases of the blood and blood-forming organs and certain disorders involving the immune mechanism: Secondary | ICD-10-CM | POA: Diagnosis not present

## 2022-04-17 DIAGNOSIS — D0512 Intraductal carcinoma in situ of left breast: Secondary | ICD-10-CM

## 2022-04-17 DIAGNOSIS — Z23 Encounter for immunization: Secondary | ICD-10-CM | POA: Diagnosis not present

## 2022-04-17 MED ORDER — OLMESARTAN MEDOXOMIL-HCTZ 20-12.5 MG PO TABS
1.0000 | ORAL_TABLET | Freq: Every day | ORAL | 1 refills | Status: DC
  Filled 2022-04-17 – 2022-06-21 (×2): qty 90, 90d supply, fill #0
  Filled 2022-09-27: qty 90, 90d supply, fill #1

## 2022-04-17 NOTE — Progress Notes (Signed)
Patient ID: Sara Freeman, female    DOB: 06/08/1971, 51 y.o.   MRN: [id number]  Chief Complaint  Patient presents with   Hypertension    HPI: Hypertension: Patient is currently maintained on the following medications for blood pressure: Olmesartan-HCTZ Failed meds include: none. Patient reports good compliance with blood pressure medications. Patient denies chest pain, headaches, shortness of breath or swelling. Last 3 blood pressure readings in our office are as follows: BP Readings from Last 3 Encounters:  04/17/22 122/74  11/14/21 126/77  10/17/21 120/82    Assessment & Plan:   Problem List Items Addressed This Visit       Cardiovascular and Mediastinum   HTN (hypertension) - Primary    chronic stable on Olmesartan/HCTZ 20-12.52m refilling today checking labs f/u in 6 mos      Relevant Medications   olmesartan-hydrochlorothiazide (BENICAR HCT) 20-12.5 MG tablet   Other Relevant Orders   Comp Met (CMET)     Other   Ductal carcinoma in situ (DCIS) of left breast - pt oncologist requesting routine labs, pt would like done here so she does not have to go to a different lab.    Relevant Orders   CBC with Differential/Platelet   Lactate dehydrogenase   Other Visit Diagnoses     Need for Tdap vaccination       Relevant Orders   Tdap vaccine greater than or equal to 7yo IM (Completed)   History of anemia       Relevant Orders   Vitamin B12   Vitamin D deficiency       Relevant Orders   Vitamin D (25 hydroxy)      Subjective:    Outpatient Medications Prior to Visit  Medication Sig Dispense Refill   calcium-vitamin D 250-100 MG-UNIT tablet Take 1 tablet by mouth daily. Taking calcium 600 mg plus vitamin D once daily     Cholecalciferol (VITAMIN D3 PO) Take 1,000 mg by mouth daily.     Estradiol (ESTROGEL TD) Place onto the skin.     fluconazole (DIFLUCAN) 150 MG tablet Take 1 now and 1 in 3 days 2 tablet 1   potassium chloride (KLOR-CON) 10 MEQ tablet  TAKE 1 TABLET BY MOUTH ONCE A DAY 90 tablet 1   tamoxifen (NOLVADEX) 20 MG tablet TAKE 1 TABLET BY MOUTH ONCE A DAY 90 tablet 3   olmesartan-hydrochlorothiazide (BENICAR HCT) 20-12.5 MG tablet TAKE 1 TABLET BY MOUTH ONCE A DAY 90 tablet 1   No facility-administered medications prior to visit.   Past Medical History:  Diagnosis Date   [place]    [place]    [place]   Breast disorder    cancer   Class 1 obesity due to excess calories without serious comorbidity with body mass index (BMI) of 32.0 to 32.9 in adult 04/05/2020   Encounter for colorectal cancer screening 04/21/2019   Encounter for general adult medical examination with abnormal findings 09/27/2020   Encounter for gynecological examination with Papanicolaou smear of cervix 09/27/2020   Encounter for screening fecal occult blood testing 11/14/2021   Encounter for well woman exam with routine gynecological exam 04/21/2019   Endometriosis    Family history of breast cancer    Family history of heart disease in female family member before age 656011/03/2017   Genetic testing 09/10/2017   Patient had genetic counseling for Hereditary Predisposition to cancer on 09/09/2017. The patient declined genetic testing at this time.  Hypertension    Personal history of radiation therapy    Past Surgical History:  Procedure Laterality Date   BREAST BIOPSY Left 2017   benign   BREAST BIOPSY Left 08/27/2017   Needle core biopsy   BREAST LUMPECTOMY Left 10/19/2017   Procedure: LEFT BREAST RE EXCISION LUMPECTOMY;  Surgeon: Erroll Luna, MD;  Location: [place];  Service: General;  Laterality: Left;   BREAST LUMPECTOMY WITH RADIOACTIVE SEED LOCALIZATION Left 09/22/2017   Procedure: BREAST LUMPECTOMY  WITH RADIOACTIVE SEED LOCALIZATION X 2;  Surgeon: Erroll Luna, MD;  Location: [facility];  Service: General;  Laterality: Left;   BREAST SURGERY N/A    [employer] 04/02/2020   TUBAL LIGATION  1999    [facility]   Allergies  Allergen Reactions   Lisinopril Cough   Penicillins Other (See Comments)    UNSPECIFIED REACTION OF CHILDHOOD Has patient had a PCN reaction causing immediate rash, facial/tongue/throat swelling, SOB or lightheadedness with hypotension: Unknown Has patient had a PCN reaction causing severe rash involving mucus membranes or skin necrosis: Unknown Has patient had a PCN reaction that required hospitalization: Unknown Has patient had a PCN reaction occurring within the last 10 years: Unknown If all of the above answers are "NO", then may proceed with Cephalosporin use.    Azithromycin Itching    Itching of skin without rash      Objective:    Physical Exam Vitals and nursing note reviewed.  Constitutional:      Appearance: Normal appearance.  Cardiovascular:     Rate and Rhythm: Normal rate and regular rhythm.  Pulmonary:     Effort: Pulmonary effort is normal.     Breath sounds: Normal breath sounds.  Musculoskeletal:        General: Normal range of motion.  Skin:    General: Skin is warm and dry.  Neurological:     Mental Status: She is alert.  Psychiatric:        Mood and Affect: Mood normal.        Behavior: Behavior normal.    BP 122/74   Pulse 96   Temp 98 F (36.7 C) (Temporal)   Ht _0  (1.6 m)   Wt 187 lb 12.8 oz (85.2 kg)   SpO2 97%   BMI 33.27 kg/m  Wt Readings from Last 3 Encounters:  04/17/22 187 lb 12.8 oz (85.2 kg)  11/14/21 186 lb 8 oz (84.6 kg)  10/17/21 187 lb 12.8 oz (85.2 kg)       [name], NP

## 2022-04-17 NOTE — Assessment & Plan Note (Signed)
   chronic  stable on Olmesartan/HCTZ 20-12.'5mg'$   refilling today  checking labs  f/u in 6 mos

## 2022-04-17 NOTE — Patient Instructions (Signed)
It was very nice to see you today!     I will review your lab results via MyChart in a few days.  I have sent your blood pressure med refill to your pharmacy.  Have a great weekend!      PLEASE NOTE:  If you had any lab tests please let us know if you have not heard back within a few days. You may see your results on MyChart before we have a chance to review them but we will give you a call once they are reviewed by [place]. If we ordered any referrals today, please let us know if you have not heard from their office within the next week.

## 2022-04-24 ENCOUNTER — Encounter: Payer: Self-pay | Admitting: Family

## 2022-04-24 ENCOUNTER — Other Ambulatory Visit: Payer: Self-pay

## 2022-04-24 ENCOUNTER — Other Ambulatory Visit: Payer: No Typology Code available for payment source

## 2022-04-24 ENCOUNTER — Telehealth: Payer: Self-pay

## 2022-04-24 NOTE — Telephone Encounter (Signed)
-----   Message from [name], NP sent at 04/23/2022 10:15 PM EDT ----- Regarding: labs not done can you find out why Jaunice's labs were not done on 9/29 during her visit? did she not go to the lab? let me know please!

## 2022-04-24 NOTE — Telephone Encounter (Signed)
Called pt, Pt stated she did get labs drawn here. Melissa and Butch Penny will contact [facility] to see what is going on with her labs.

## 2022-04-28 ENCOUNTER — Telehealth: Payer: Self-pay

## 2022-04-28 NOTE — Telephone Encounter (Signed)
-----   Message from [name], NP sent at 04/23/2022 10:15 PM EDT ----- Regarding: labs not done can you find out why Sara Freeman's labs were not done on 9/29 during her visit? did she not go to the lab? let me know please!

## 2022-04-28 NOTE — Telephone Encounter (Signed)
LVM in regards to labs.

## 2022-04-29 ENCOUNTER — Other Ambulatory Visit: Payer: Self-pay

## 2022-04-29 DIAGNOSIS — Z0001 Encounter for general adult medical examination with abnormal findings: Secondary | ICD-10-CM

## 2022-05-01 ENCOUNTER — Other Ambulatory Visit: Payer: No Typology Code available for payment source

## 2022-05-08 ENCOUNTER — Other Ambulatory Visit (INDEPENDENT_AMBULATORY_CARE_PROVIDER_SITE_OTHER): Payer: No Typology Code available for payment source

## 2022-05-08 DIAGNOSIS — Z0001 Encounter for general adult medical examination with abnormal findings: Secondary | ICD-10-CM | POA: Diagnosis not present

## 2022-05-08 DIAGNOSIS — E878 Other disorders of electrolyte and fluid balance, not elsewhere classified: Secondary | ICD-10-CM

## 2022-05-08 LAB — COMPREHENSIVE METABOLIC PANEL
ALT: 11 U/L (ref 0–35)
AST: 16 U/L (ref 0–37)
Albumin: 4.1 g/dL (ref 3.5–5.2)
Alkaline Phosphatase: 35 U/L — ABNORMAL LOW (ref 39–117)
BUN: 13 mg/dL (ref 6–23)
CO2: 26 mEq/L (ref 19–32)
Calcium: 9.5 mg/dL (ref 8.4–10.5)
Chloride: 93 mEq/L — ABNORMAL LOW (ref 96–112)
Creatinine, Ser: 0.83 mg/dL (ref 0.40–1.20)
GFR: 81.91 mL/min (ref >60.00)
Glucose, Bld: 129 mg/dL — ABNORMAL HIGH (ref 70–99)
Potassium: 3.3 mEq/L — ABNORMAL LOW (ref 3.5–5.1)
Sodium: 128 mEq/L — ABNORMAL LOW (ref 135–145)
Total Bilirubin: 0.3 mg/dL (ref 0.2–1.2)
Total Protein: 7.1 g/dL (ref 6.0–8.3)

## 2022-05-08 LAB — VITAMIN B12: Vitamin B-12: 306 pg/mL (ref 211–911)

## 2022-05-08 LAB — VITAMIN D 25 HYDROXY (VIT D DEFICIENCY, FRACTURES): VITD: 39.87 ng/mL (ref 30.00–100.00)

## 2022-05-11 LAB — CBC WITH DIFFERENTIAL/PLATELET
Absolute Monocytes: 566 cells/uL (ref 200–950)
Basophils Absolute: 41 cells/uL (ref 0–200)
Basophils Relative: 0.6 %
Eosinophils Absolute: 131 cells/uL (ref 15–500)
Eosinophils Relative: 1.9 %
HCT: 41.8 % (ref 35.0–45.0)
Hemoglobin: 13.6 g/dL (ref 11.7–15.5)
Lymphs Abs: 1918 cells/uL (ref 850–3900)
MCH: 28.1 pg (ref 27.0–33.0)
MCHC: 32.5 g/dL (ref 32.0–36.0)
MCV: 86.4 fL (ref 80.0–100.0)
MPV: 12 fL (ref 7.5–12.5)
Monocytes Relative: 8.2 %
Neutro Abs: 4244 cells/uL (ref 1500–7800)
Neutrophils Relative %: 61.5 %
Platelets: 236 10*3/uL (ref 140–400)
RBC: 4.84 10*6/uL (ref 3.80–5.10)
RDW: 12.2 % (ref 11.0–15.0)
Total Lymphocyte: 27.8 %
WBC: 6.9 10*3/uL (ref 3.8–10.8)

## 2022-05-11 LAB — LACTATE DEHYDROGENASE: LDH: 165 U/L (ref 120–250)

## 2022-05-11 LAB — TIQ-MISC

## 2022-05-11 LAB — SPECIMEN COMPROMISED

## 2022-05-14 ENCOUNTER — Encounter: Payer: Self-pay | Admitting: Family

## 2022-05-14 NOTE — Progress Notes (Signed)
So sorry to hear about your mother in [place] passing, I remember you saying she had been ill. And I apologize for you having to come back for lab work! Your glucose (sugar) is slightly high, but since non-fasting I'm not too worried,  kidney and liver function, blood count, Vit D & B12 are in normal range.  Your electrolytes, sodium, chloride & potassium are low - are you taking the potassium every day? If yes, I'd like you to increase to 2 tabs daily, be sure to not drink more than 2 liters of water or total fluids per day and we need to recheck your levels in 1 week - this can be a lab only appt, non-fasting, if you can call and schedule this please.  Any questions, let me know!

## 2022-05-20 ENCOUNTER — Inpatient Hospital Stay: Payer: No Typology Code available for payment source | Attending: Hematology | Admitting: Hematology

## 2022-05-20 VITALS — BP 119/76 | HR 88 | Temp 98.5°F | Resp 17 | Ht 63.0 in | Wt 188.3 lb

## 2022-05-20 DIAGNOSIS — Z1231 Encounter for screening mammogram for malignant neoplasm of breast: Secondary | ICD-10-CM | POA: Diagnosis not present

## 2022-05-20 DIAGNOSIS — Z79811 Long term (current) use of aromatase inhibitors: Secondary | ICD-10-CM | POA: Insufficient documentation

## 2022-05-20 DIAGNOSIS — D0512 Intraductal carcinoma in situ of left breast: Secondary | ICD-10-CM | POA: Diagnosis not present

## 2022-05-20 DIAGNOSIS — Z79899 Other long term (current) drug therapy: Secondary | ICD-10-CM | POA: Diagnosis not present

## 2022-05-20 NOTE — Patient Instructions (Addendum)
[facility]  Discharge Instructions  You were seen and examined today by Dr. Delton Coombes.  Dr. Delton Coombes discussed your most recent lab work which revealed that your sodium and potassium are low all other labs look good.  Keep appointment with your primary care to follow up on the sodium and potassium levels. Continue taking Vitamin D and Calcium over the counter.  Follow-up as scheduled in 6 months with labs mammogram prior.    Thank you for choosing [facility] to provide your oncology and hematology care.   To afford each patient quality time with our provider, please arrive at least 15 minutes before your scheduled appointment time. You may need to reschedule your appointment if you arrive late (10 or more minutes). Arriving late affects you and other patients whose appointments are after yours.  Also, if you miss three or more appointments without notifying the office, you may be dismissed from the clinic at the provider's discretion.    Again, thank you for choosing [facility].  Our hope is that these requests will decrease the amount of time that you wait before being seen by our physicians.   If you have a lab appointment with the [facility] please come in thru the Main Entrance and check in at the main information desk.           _____________________________________________________________  Should you have questions after your visit to [facility], please contact our office at [phone] and follow the prompts.  Our office hours are 8:00 a.m. to 4:30 p.m. Monday - Thursday and 8:00 a.m. to 2:30 p.m. Friday.  Please note that voicemails left after 4:00 p.m. may not be returned until the following business day.  We are closed weekends and all major holidays.  You do have access to a nurse 24-7, just call the main number to the clinic [phone] and do not press any options, hold on the line and  a nurse will answer the phone.    For prescription refill requests, have your pharmacy contact our office and allow 72 hours.    Masks are optional in the cancer centers. If you would like for your care team to wear a mask while they are taking care of you, please let them know. You may have one support person who is at least 51 years old accompany you for your appointments.

## 2022-05-20 NOTE — Progress Notes (Signed)
[facility] [address], [place] [address]   Patient Care Team: [name], NP as PCP - General (Family Medicine)  SUMMARY OF ONCOLOGIC HISTORY: Oncology History   No history exists.    CHIEF COMPLIANT: Follow-up of DCIS of left breast   INTERVAL HISTORY: [name] is a 51 y.o. 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  Denies any vaginal bleeding or spotting.  Has hot flashes and is taking [place] which started in July.  She also started vitamin D 1000 units daily in August.  REVIEW OF SYSTEMS:   Review of Systems  Constitutional:  Negative for appetite change and fatigue.  Endocrine: Positive for hot flashes.  All other systems reviewed and are negative.   I have reviewed the past medical history, past surgical history, social history and family history with the patient and they are unchanged from previous note.   ALLERGIES:   is allergic to lisinopril, penicillins, and azithromycin.   MEDICATIONS:  Current Outpatient Medications  Medication Sig Dispense Refill   calcium-vitamin D 250-100 MG-UNIT tablet Take 1 tablet by mouth daily. Taking calcium 600 mg plus vitamin D once daily     Cholecalciferol (VITAMIN D3 PO) Take 1,000 mg by mouth daily.     Estradiol (ESTROGEL TD) Place onto the skin.     fluconazole (DIFLUCAN) 150 MG tablet Take 1 now and 1 in 3 days 2 tablet 1   Nutritional Supplements (ESTROVEN NIGHTTIME PO) Take 1 tablet by mouth at bedtime.     olmesartan-hydrochlorothiazide (BENICAR HCT) 20-12.5 MG tablet TAKE 1 TABLET BY MOUTH ONCE A DAY 90 tablet 1   potassium chloride (KLOR-CON) 10 MEQ tablet TAKE 1 TABLET BY MOUTH ONCE A DAY 90 tablet 1   tamoxifen (NOLVADEX) 20 MG tablet TAKE 1 TABLET BY MOUTH ONCE A DAY 90 tablet 3   No current facility-administered medications for this visit.     PHYSICAL EXAMINATION: Performance status (ECOG): 1 - Symptomatic but completely ambulatory  Vitals:   05/20/22 1520   BP: 119/76  Pulse: 88  Resp: 17  Temp: 98.5 F (36.9 C)  SpO2: 97%   Wt Readings from Last 3 Encounters:  05/20/22 188 lb 4.8 oz (85.4 kg)  04/17/22 187 lb 12.8 oz (85.2 kg)  11/14/21 186 lb 8 oz (84.6 kg)   Physical Exam Vitals reviewed.  Constitutional:      Appearance: Normal appearance. She is obese.  Cardiovascular:     Rate and Rhythm: Normal rate and regular rhythm.     Pulses: Normal pulses.     Heart sounds: Normal heart sounds.  Pulmonary:     Effort: Pulmonary effort is normal.     Breath sounds: Normal breath sounds.  Chest:  Breasts:    Right: Normal. No swelling, bleeding, inverted nipple, mass, nipple discharge, skin change or tenderness.     Left: No swelling, bleeding, inverted nipple, mass, nipple discharge, skin change (lower quadrant lumpectomy scar WNL) or tenderness.  Abdominal:     Palpations: Abdomen is soft. There is no hepatomegaly, splenomegaly or mass.     Tenderness: There is no abdominal tenderness.  Musculoskeletal:     Right lower leg: No edema.     Left lower leg: No edema.  Lymphadenopathy:     Upper Body:     Right upper body: No supraclavicular or axillary adenopathy.     Left upper body: No supraclavicular or axillary adenopathy.  Neurological:  General: No focal deficit present.     Mental Status: She is alert and oriented to person, place, and time.  Psychiatric:        Mood and Affect: Mood normal.        Behavior: Behavior normal.     Breast Exam Chaperone: Thana Ates     LABORATORY DATA:  I have reviewed the data as listed    Latest Ref Rng & Units 05/08/2022    2:25 PM 08/25/2019   12:11 PM 01/13/2019    9:27 AM  CMP  Glucose 70 - 99 mg/dL 129  80  87   BUN 6 - 23 mg/dL '13  10  13   '$ Creatinine 0.40 - 1.20 mg/dL 0.83  0.77  0.84   Sodium 135 - 145 mEq/L 128  139  139   Potassium 3.5 - 5.1 mEq/L 3.3  4.0  4.2   Chloride 96 - 112 mEq/L 93  100  102   CO2 19 - 32 mEq/L '26  23  24   '$ Calcium 8.4 - 10.5 mg/dL 9.5   10.1  9.3   Total Protein 6.0 - 8.3 g/dL 7.1  6.9  6.3   Total Bilirubin 0.2 - 1.2 mg/dL 0.3  <0.2  0.3   Alkaline Phos 39 - 117 U/L 35  50  44   AST 0 - 37 U/L '16  13  15   '$ ALT 0 - 35 U/L '11  9  9    '$ No results found for: "CAN153" Lab Results  Component Value Date   WBC 6.9 05/08/2022   HGB 13.6 05/08/2022   HCT 41.8 05/08/2022   MCV 86.4 05/08/2022   PLT 236 05/08/2022   NEUTROABS 4,244 05/08/2022    ASSESSMENT:  1.  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 - Tamoxifen started on 10/29/2017. - Last mammogram on 09/06/2020, BI-RADS Category 2.   PLAN:  Left breast DCIS: -She is tolerating tamoxifen reasonably well. - She started Estroven (black cohosh) which is helping hot flashes. - Physical examination today shows lumpectomy scar in the inferior areolar region is within normal limits with no palpable masses.  No palpable adenopathy. - Reviewed labs which showed normal LFTs.  CBC was grossly normal.  Vitamin D was 39.  Her sodium is low at 128 and potassium 3.3.  She denies any other new medications or over-the-counter medications. - She we will have her sodium checked on Friday by her PMD. - We will schedule her for screening mammogram after 09/12/2022. - She will come back in 6 months for follow-up.  She will complete tamoxifen in April 2024.  After that we will release her from our clinic so that she can follow-up with her PMD.  Breast Cancer therapy associated bone loss: I have recommended calcium, Vitamin D and weight bearing exercises.  Orders placed this encounter:  Orders Placed This  Encounter  Procedures   MM 3D SCREEN BREAST BILATERAL   TSH   Comprehensive metabolic panel   CBC with Differential/Platelet     The patient has a good understanding of the overall plan. She agrees with it. She will call with any  problems that may develop before the next visit here.  Derek Jack, MD [facility] [phone]

## 2022-05-22 ENCOUNTER — Other Ambulatory Visit (INDEPENDENT_AMBULATORY_CARE_PROVIDER_SITE_OTHER): Payer: No Typology Code available for payment source

## 2022-05-22 DIAGNOSIS — D0512 Intraductal carcinoma in situ of left breast: Secondary | ICD-10-CM | POA: Diagnosis not present

## 2022-05-23 LAB — CBC WITH DIFFERENTIAL/PLATELET
Basophils Absolute: 0.1 10*3/uL (ref 0.0–0.2)
Basos: 1 %
EOS (ABSOLUTE): 0.1 10*3/uL (ref 0.0–0.4)
Eos: 2 %
Hematocrit: 40.8 % (ref 34.0–46.6)
Hemoglobin: 13.6 g/dL (ref 11.1–15.9)
Immature Grans (Abs): 0 10*3/uL (ref 0.0–0.1)
Immature Granulocytes: 0 %
Lymphocytes Absolute: 1.7 10*3/uL (ref 0.7–3.1)
Lymphs: 29 %
MCH: 28.7 pg (ref 26.6–33.0)
MCHC: 33.3 g/dL (ref 31.5–35.7)
MCV: 86 fL (ref 79–97)
Monocytes Absolute: 0.4 10*3/uL (ref 0.1–0.9)
Monocytes: 7 %
Neutrophils Absolute: 3.5 10*3/uL (ref 1.4–7.0)
Neutrophils: 61 %
Platelets: 256 10*3/uL (ref 150–450)
RBC: 4.74 x10E6/uL (ref 3.77–5.28)
RDW: 12.4 % (ref 11.7–15.4)
WBC: 5.7 10*3/uL (ref 3.4–10.8)

## 2022-05-23 LAB — COMPREHENSIVE METABOLIC PANEL
ALT: 9 IU/L (ref 0–32)
AST: 16 IU/L (ref 0–40)
Albumin/Globulin Ratio: 1.6 (ref 1.2–2.2)
Albumin: 4.4 g/dL (ref 3.9–4.9)
Alkaline Phosphatase: 48 IU/L (ref 44–121)
BUN/Creatinine Ratio: 19 (ref 9–23)
BUN: 16 mg/dL (ref 6–24)
Bilirubin Total: 0.3 mg/dL (ref 0.0–1.2)
CO2: 23 mmol/L (ref 20–29)
Calcium: 9.8 mg/dL (ref 8.7–10.2)
Chloride: 102 mmol/L (ref 96–106)
Creatinine, Ser: 0.86 mg/dL (ref 0.57–1.00)
Globulin, Total: 2.7 g/dL (ref 1.5–4.5)
Glucose: 107 mg/dL — ABNORMAL HIGH (ref 70–99)
Potassium: 4.2 mmol/L (ref 3.5–5.2)
Sodium: 142 mmol/L (ref 134–144)
Total Protein: 7.1 g/dL (ref 6.0–8.5)
eGFR: 82 mL/min/{1.73_m2} (ref >59)

## 2022-05-23 LAB — TSH: TSH: 1.59 u[IU]/mL (ref 0.450–4.500)

## 2022-05-26 ENCOUNTER — Other Ambulatory Visit (HOSPITAL_COMMUNITY): Payer: Self-pay

## 2022-06-14 ENCOUNTER — Encounter (INDEPENDENT_AMBULATORY_CARE_PROVIDER_SITE_OTHER): Payer: No Typology Code available for payment source | Admitting: Family

## 2022-06-14 DIAGNOSIS — Z7189 Other specified counseling: Secondary | ICD-10-CM | POA: Diagnosis not present

## 2022-06-16 NOTE — Telephone Encounter (Signed)
Please see the MyChart message reply(ies) for my assessment and plan.  The patient gave consent for this Medical Advice Message and is aware that it may result in a bill to their insurance company as well as the possibility that this may result in a co-payment or deductible. They are an established patient, but are not seeking medical advice exclusively about a problem treated during an in person or video visit in the last 7 days. I did not recommend an in person or video visit within 7 days of my reply.  I spent a total of 15 minutes cumulative time within 7 days through [place], NP

## 2022-06-21 ENCOUNTER — Other Ambulatory Visit (HOSPITAL_COMMUNITY): Payer: Self-pay

## 2022-06-22 ENCOUNTER — Other Ambulatory Visit (HOSPITAL_COMMUNITY): Payer: Self-pay

## 2022-09-14 ENCOUNTER — Ambulatory Visit (HOSPITAL_COMMUNITY): Payer: No Typology Code available for payment source

## 2022-09-18 ENCOUNTER — Ambulatory Visit
Admission: RE | Admit: 2022-09-18 | Discharge: 2022-09-18 | Disposition: A | Discharge status: Home / Self Care | Payer: 59 | Source: Ambulatory Visit | Attending: Hematology | Admitting: Hematology

## 2022-09-18 DIAGNOSIS — Z1231 Encounter for screening mammogram for malignant neoplasm of breast: Secondary | ICD-10-CM | POA: Diagnosis not present

## 2022-09-18 DIAGNOSIS — D0512 Intraductal carcinoma in situ of left breast: Secondary | ICD-10-CM

## 2022-09-27 ENCOUNTER — Other Ambulatory Visit: Payer: Self-pay | Admitting: Hematology

## 2022-09-27 ENCOUNTER — Other Ambulatory Visit: Payer: Self-pay | Admitting: Family

## 2022-09-27 DIAGNOSIS — D0512 Intraductal carcinoma in situ of left breast: Secondary | ICD-10-CM

## 2022-09-27 DIAGNOSIS — I10 Essential (primary) hypertension: Secondary | ICD-10-CM

## 2022-09-27 MED ORDER — TAMOXIFEN CITRATE 20 MG PO TABS
20.0000 mg | ORAL_TABLET | Freq: Every day | ORAL | 3 refills | Status: DC
  Filled 2022-09-27: qty 90, 90d supply, fill #0

## 2022-09-28 ENCOUNTER — Other Ambulatory Visit: Payer: Self-pay

## 2022-09-28 ENCOUNTER — Other Ambulatory Visit (HOSPITAL_COMMUNITY): Payer: Self-pay

## 2022-09-28 MED ORDER — POTASSIUM CHLORIDE ER 10 MEQ PO TBCR
10.0000 meq | EXTENDED_RELEASE_TABLET | Freq: Every day | ORAL | 1 refills | Status: DC
  Filled 2022-09-28: qty 90, 90d supply, fill #0
  Filled 2022-12-20: qty 90, 90d supply, fill #1

## 2022-10-19 ENCOUNTER — Other Ambulatory Visit: Payer: Self-pay | Admitting: *Deleted

## 2022-10-19 DIAGNOSIS — D500 Iron deficiency anemia secondary to blood loss (chronic): Secondary | ICD-10-CM

## 2022-10-19 DIAGNOSIS — D0512 Intraductal carcinoma in situ of left breast: Secondary | ICD-10-CM

## 2022-10-23 ENCOUNTER — Ambulatory Visit: Payer: No Typology Code available for payment source | Admitting: Family

## 2022-10-30 ENCOUNTER — Other Ambulatory Visit (HOSPITAL_COMMUNITY): Payer: Self-pay

## 2022-10-30 ENCOUNTER — Ambulatory Visit (INDEPENDENT_AMBULATORY_CARE_PROVIDER_SITE_OTHER): Payer: 59 | Admitting: Family

## 2022-10-30 ENCOUNTER — Encounter: Payer: Self-pay | Admitting: Family

## 2022-10-30 VITALS — BP 121/77 | HR 93 | Temp 97.2°F | Ht 63.0 in | Wt 190.4 lb

## 2022-10-30 DIAGNOSIS — Z8489 Family history of other specified conditions: Secondary | ICD-10-CM | POA: Diagnosis not present

## 2022-10-30 DIAGNOSIS — I10 Essential (primary) hypertension: Secondary | ICD-10-CM

## 2022-10-30 LAB — BASIC METABOLIC PANEL
BUN: 12 mg/dL (ref 6–23)
CO2: 29 mEq/L (ref 19–32)
Calcium: 9.4 mg/dL (ref 8.4–10.5)
Chloride: 100 mEq/L (ref 96–112)
Creatinine, Ser: 0.85 mg/dL (ref 0.40–1.20)
GFR: 79.33 mL/min (ref >60.00)
Glucose, Bld: 148 mg/dL — ABNORMAL HIGH (ref 70–99)
Potassium: 3.4 mEq/L — ABNORMAL LOW (ref 3.5–5.1)
Sodium: 139 mEq/L (ref 135–145)

## 2022-10-30 LAB — LIPID PANEL
Cholesterol: 177 mg/dL (ref 0–200)
HDL: 59.8 mg/dL (ref >39.00)
LDL Cholesterol: 93 mg/dL (ref 0–99)
NonHDL: 116.96
Total CHOL/HDL Ratio: 3
Triglycerides: 119 mg/dL (ref 0.0–149.0)
VLDL: 23.8 mg/dL (ref 0.0–40.0)

## 2022-10-30 MED ORDER — OLMESARTAN MEDOXOMIL-HCTZ 20-12.5 MG PO TABS
1.0000 | ORAL_TABLET | Freq: Every day | ORAL | 1 refills | Status: DC
Start: 2022-10-30 — End: 2023-05-07
  Filled 2022-10-30 – 2022-12-20 (×2): qty 90, 90d supply, fill #0
  Filled 2023-03-22: qty 90, 90d supply, fill #1

## 2022-10-30 NOTE — Progress Notes (Unsigned)
Patient ID: Sara Freeman, female    DOB: 30-Jun-1971, 52 y.o.   MRN: [id number]  Chief Complaint  Patient presents with   Hypertension    Medication refill    HPI: Hypertension: Patient is currently maintained on the following medications for blood pressure: Olmesartan-HCTZ 20-12.5mg  qd. Pt states her brother died in 08/18/2022 suddenly, states he was mildly obese, no DM, unsure if HTN, dyslipidemia, also states her father died at a young age of sudden MI, she is concerned about her risks. Failed meds include: none. Patient reports good compliance with blood pressure medications. Patient denies chest pain, headaches, shortness of breath or swelling. Last 3 blood pressure readings in our office are as follows: BP Readings from Last 3 Encounters:  10/30/22 121/77  05/20/22 119/76  04/17/22 122/74   Sudden deaths in family:  brother died suddenly in 08-18-22, father died of MI or CVA at young age and she has had several other family members die suddenly. Pt is concerned if she is at risk and needs testing done.  Assessment & Plan:  Hypertension, unspecified type Assessment & Plan: chronic stable on Olmesartan/HCTZ 20-12.5mg  refilling today checking BMET & lipids today, oncology checking CMP & CBC next week f/u in 6 mos  Orders: -     Olmesartan Medoxomil-HCTZ; Take 1 tablet by mouth daily.  Dispense: 90 tablet; Refill: 1 -     Lipid panel -     Basic metabolic panel -      Apolipoprotein Evaluation -     Ambulatory referral to Cardiology  Family history of sudden death in brother Assessment & Plan: brother died in jan suddenly father died at young age w/MI has had other family die of CVA she is concerned re her risks discussed preventative measures with HTN control, losing weight, exercise, and healthy diet low in processed foods sending referral to Cardiology for further workup  Orders: -     Ambulatory referral to Cardiology   Subjective:    Outpatient Medications Prior  to Visit  Medication Sig Dispense Refill   calcium-vitamin D 250-100 MG-UNIT tablet Take 1 tablet by mouth daily. Taking calcium 600 mg plus vitamin D once daily     Cholecalciferol (VITAMIN D3 PO) Take 1,000 mg by mouth daily.     fluconazole (DIFLUCAN) 150 MG tablet Take 1 now and 1 in 3 days 2 tablet 1   potassium chloride (KLOR-CON) 10 MEQ tablet Take 1 tablet (10 mEq total) by mouth daily. 90 tablet 1   tamoxifen (NOLVADEX) 20 MG tablet Take 1 tablet (20 mg total) by mouth daily. 90 tablet 3   olmesartan-hydrochlorothiazide (BENICAR HCT) 20-12.5 MG tablet Take 1 tablet by mouth daily. 90 tablet 1   Estradiol (ESTROGEL TD) Place onto the skin.     Nutritional Supplements (ESTROVEN NIGHTTIME PO) Take 1 tablet by mouth at bedtime.     No facility-administered medications prior to visit.   Past Medical History:  Diagnosis Date   Allergy    Breast cancer    Breast cancer 09/09/2017   Breast disorder    cancer   Class 1 obesity due to excess calories without serious comorbidity with body mass index (BMI) of 32.0 to 32.9 in adult 04/05/2020   Encounter for colorectal cancer screening 04/21/2019   Encounter for general adult medical examination with abnormal findings 09/27/2020   Encounter for gynecological examination with Papanicolaou smear of cervix 09/27/2020   Encounter for screening fecal occult blood testing 11/14/2021   Encounter for  well woman exam with routine gynecological exam 04/21/2019   Endometriosis    Family history of breast cancer    Family history of heart disease in female family member before age 19 05/28/2017   Genetic testing 09/10/2017   Patient had genetic counseling for Hereditary Predisposition to cancer on 09/09/2017. The patient declined genetic testing at this time.    Personal history of radiation therapy    Past Surgical History:  Procedure Laterality Date   BREAST BIOPSY Left 2017   benign   BREAST BIOPSY Left 08/27/2017   Needle core biopsy   BREAST  LUMPECTOMY Left 10/19/2017   Procedure: LEFT BREAST RE EXCISION LUMPECTOMY;  Surgeon: Harriette Bouillon, MD;  Location: [facility];  Service: General;  Laterality: Left;   BREAST LUMPECTOMY WITH RADIOACTIVE SEED LOCALIZATION Left 09/22/2017   Procedure: BREAST LUMPECTOMY  WITH RADIOACTIVE SEED LOCALIZATION X 2;  Surgeon: Harriette Bouillon, MD;  Location: [facility] OR;  Service: General;  Laterality: Left;   BREAST SURGERY N/A    [employer] 04/02/2020   TUBAL LIGATION  1999   [place]   Allergies  Allergen Reactions   Lisinopril Cough   Penicillins Other (See Comments)    UNSPECIFIED REACTION OF CHILDHOOD Has patient had a PCN reaction causing immediate rash, facial/tongue/throat swelling, SOB or lightheadedness with hypotension: Unknown Has patient had a PCN reaction causing severe rash involving mucus membranes or skin necrosis: Unknown Has patient had a PCN reaction that required hospitalization: Unknown Has patient had a PCN reaction occurring within the last 10 years: Unknown If all of the above answers are "NO", then may proceed with Cephalosporin use.    Azithromycin Itching    Itching of skin without rash      Objective:    Physical Exam Vitals and nursing note reviewed.  Constitutional:      Appearance: Normal appearance.  Cardiovascular:     Rate and Rhythm: Normal rate and regular rhythm.  Pulmonary:     Effort: Pulmonary effort is normal.     Breath sounds: Normal breath sounds.  Musculoskeletal:        General: Normal range of motion.  Skin:    General: Skin is warm and dry.  Neurological:     Mental Status: She is alert.  Psychiatric:        Mood and Affect: Mood normal.        Behavior: Behavior normal.    BP 121/77 (BP Location: Left Arm, Patient Position: Sitting, Cuff Size: Large)   Pulse 93   Temp (!) 97.2 F (36.2 C) (Temporal)   Ht 5\' 3"  (1.6 m)   Wt 190 lb 6.4 oz (86.4 kg)   SpO2 99%   BMI 33.73 kg/m  Wt Readings from Last 3 Encounters:   10/30/22 190 lb 6.4 oz (86.4 kg)  05/20/22 188 lb 4.8 oz (85.4 kg)  04/17/22 187 lb 12.8 oz (85.2 kg)       Dulce Sellar, NP

## 2022-10-30 NOTE — Assessment & Plan Note (Signed)
chronic stable on Olmesartan/HCTZ 20-12.5mg  refilling today checking BMET & lipids today, oncology checking CMP & CBC next week f/u in 6 mos

## 2022-10-30 NOTE — Assessment & Plan Note (Signed)
brother died in jan suddenly father died at young age w/MI has had other family die of CVA she is concerned re her risks discussed preventative measures with HTN control, losing weight, exercise, and healthy diet low in processed foods sending referral to Cardiology for further workup

## 2022-11-03 ENCOUNTER — Encounter: Payer: Self-pay | Admitting: Family

## 2022-11-03 DIAGNOSIS — E876 Hypokalemia: Secondary | ICD-10-CM

## 2022-11-03 DIAGNOSIS — R7309 Other abnormal glucose: Secondary | ICD-10-CM

## 2022-11-03 LAB — APOLIPOPROTEIN EVALUATION
APOLIPOPROTEIN B/A1 RATIO: 0.42 (ref <0.63)
Apolipoprotein A-1: 199 mg/dL (ref >=125)
Apolipoprotein B: 84 mg/dL (ref <90)

## 2022-11-04 NOTE — Telephone Encounter (Signed)
I entered 2 labs for future via [facility] can you fax the order to the number Brystal provided please?

## 2022-11-05 NOTE — Telephone Encounter (Signed)
Faxed

## 2022-11-13 DIAGNOSIS — D500 Iron deficiency anemia secondary to blood loss (chronic): Secondary | ICD-10-CM | POA: Diagnosis not present

## 2022-11-13 DIAGNOSIS — D0512 Intraductal carcinoma in situ of left breast: Secondary | ICD-10-CM | POA: Diagnosis not present

## 2022-11-13 DIAGNOSIS — R7309 Other abnormal glucose: Secondary | ICD-10-CM | POA: Diagnosis not present

## 2022-11-17 NOTE — Progress Notes (Signed)
[facility] [address], [place] [address]    Clinic Day:  11/18/2022  Referring physician: Dulce Sellar, NP  Patient Care Team: Dulce Sellar, NP as PCP - General (Family Medicine)   ASSESSMENT & PLAN:   Assessment: 1.  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 - Tamoxifen started on 10/29/2017. - Last mammogram on 09/06/2020, BI-RADS Category 2.    Plan: Left breast DCIS: - She is continuing tamoxifen daily. - Mammogram on 09/18/2022: BI-RADS Category 1. - Labs from 11/13/2022 reviewed by me shows normal LFTs. - She has completed 5 years of tamoxifen.  She will complete the current bottle and stop taking tamoxifen. - She was instructed to continue mammograms every year. - She will follow-up with her PMD.  I will be glad to see her on an as-needed basis.   No orders of the defined types were placed in this encounter.     I,Katie Daubenspeck,acting as a Neurosurgeon for Doreatha Massed, MD.,have documented all relevant documentation on the behalf of Doreatha Massed, MD,as directed by  Doreatha Massed, MD while in the presence of Doreatha Massed, MD.   I, Doreatha Massed MD, have reviewed the above documentation for accuracy and completeness, and I agree with the above.   Doreatha Massed, MD   5/1/20244:52 PM  CHIEF COMPLAINT:   Diagnosis: DCIS of left breast    Cancer Staging  Ductal carcinoma in situ (DCIS) of left breast Staging form: Breast, AJCC 8th Edition - Clinical stage from 10/01/2017: Stage 0 (cTis (DCIS), cN0, cM0, G3, ER+,  PR+, HER2: Not Assessed) - Signed by Doreatha Massed, MD on 10/01/2017    Prior Therapy: lumpectomy on 09/22/2017   Current Therapy:  tamoxifen   HISTORY OF PRESENT ILLNESS:   Oncology History   No history exists.     INTERVAL HISTORY:   Sara Freeman is a 52 y.o. female presenting to clinic today for follow up of DCIS of left breast. She was last seen by me on 05/20/22.  Since her last visit, she underwent annual screening mammogram on 09/18/22. Results were negative.  Today, she states that she is doing well overall. Her appetite level is at 100%. Her energy level is at 100%.  PAST MEDICAL HISTORY:   Past Medical History: Past Medical History:  Diagnosis Date   Allergy    Breast cancer (HCC)    Breast cancer (HCC) 09/09/2017   Breast disorder    cancer   Class 1 obesity due to excess calories without serious comorbidity with body mass index (BMI) of 32.0 to 32.9 in adult 04/05/2020   Encounter for colorectal cancer screening 04/21/2019   Encounter for general adult medical examination with abnormal findings 09/27/2020   Encounter for gynecological examination with Papanicolaou smear of cervix 09/27/2020   Encounter for screening fecal occult blood testing 11/14/2021   Encounter for well woman exam with routine gynecological exam 04/21/2019   Endometriosis    Family history of breast cancer    Family history of heart disease in female family member before age 39 05/28/2017   Genetic testing 09/10/2017   Patient had genetic counseling for Hereditary Predisposition to cancer  on 09/09/2017. The patient declined genetic testing at this time.    Personal history of radiation therapy     Surgical History: Past Surgical History:  Procedure Laterality Date   BREAST BIOPSY Left 2017   benign   BREAST BIOPSY Left 08/27/2017   Needle core biopsy   BREAST LUMPECTOMY Left 10/19/2017   Procedure: LEFT BREAST RE EXCISION LUMPECTOMY;  Surgeon: Harriette Bouillon, MD;  Location: [facility];   Service: General;  Laterality: Left;   BREAST LUMPECTOMY WITH RADIOACTIVE SEED LOCALIZATION Left 09/22/2017   Procedure: BREAST LUMPECTOMY  WITH RADIOACTIVE SEED LOCALIZATION X 2;  Surgeon: Harriette Bouillon, MD;  Location: [facility] OR;  Service: General;  Laterality: Left;   BREAST SURGERY N/A    Phreesia 04/02/2020   TUBAL LIGATION  1999   [place]    Social History: Social History   Socioeconomic History   Marital status: Married    Spouse name: Windy Fast   Number of children: 1   Years of education: 14   Highest education level: Associate degree: academic program  Occupational History   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    Comment: Dr Karilyn Cota  Tobacco Use   Smoking status: Former    Packs/day: 1    Types: Cigarettes    Start date: 07/20/1986    Quit date: 07/20/2006    Years since quitting: 16.3   Smokeless tobacco: Never  Vaping Use   Vaping Use: Never used  Substance and Sexual Activity   Alcohol use: No   Drug use: No   Sexual activity: Yes    Birth control/protection: Surgical    Comment: tubal  Other Topics Concern   Not on file  Social History Narrative   Lives with husband Windy Fast married 30 years    Son lives close by is married-step grandchildren      Dogs: Musician and Flip       Enjoys: Likes to read- genres: romance, christian novels; Goes camping once a month      Diet: eats all food groups    Caffeine: 1 -2 cups of coffee daily    Water: some 1-2 daily       Wears seat belt   Does not use phone while driving   Heritage manager     Social Determinants of Health   Financial Resource Strain: Low Risk  (10/26/2022)   Overall Financial Resource Strain (CARDIA)    Difficulty of Paying Living Expenses: Not hard at all  Food Insecurity: No Food Insecurity (10/26/2022)   Hunger Vital Sign    Worried About Running Out of Food in the Last Year: Never true    Ran Out of Food in the Last Year: Never true  Transportation Needs: No  Transportation Needs (10/26/2022)   PRAPARE - Administrator, Civil Service (Medical): No    Lack of Transportation (Non-Medical): No  Physical Activity: Unknown (10/26/2022)   Exercise Vital Sign    Days of Exercise per Week: 0 days    Minutes of Exercise per Session: Not on file  Recent Concern: Physical Activity - Inactive (10/26/2022)   Exercise Vital Sign    Days of Exercise per Week: 0 days    Minutes of Exercise per Session: 0 min  Stress: No Stress Concern Present (10/26/2022)   [employer] of Occupational Health - Occupational Stress Questionnaire    Feeling of Stress : Only a little  Social Connections: Socially Integrated (10/26/2022)   Social  Connection and Isolation Panel [NHANES]    Frequency of Communication with Friends and Family: More than three times a week    Frequency of Social Gatherings with Friends and Family: More than three times a week    Attends Religious Services: More than 4 times per year    Active Member of [employer] or Organizations: Yes    Attends Engineer, structural: More than 4 times per year    Marital Status: Married  Catering manager Violence: Not At Risk (11/14/2021)   Humiliation, Afraid, Rape, and Kick questionnaire    Fear of Current or Ex-Partner: No    Emotionally Abused: No    Physically Abused: No    Sexually Abused: No    Family History: Family History  Problem Relation Age of Onset   Thyroid disease Mother    Hypertension Mother    Heart attack Father    Hypertension Father    COPD Father    Heart disease Father        47 died   Stroke Paternal Uncle    Aneurysm Maternal Grandfather    Stroke Paternal Grandmother    Stroke Paternal Grandfather    Hypertension Brother    Thyroid disease Son    CAD Other    Heart disease Other     Current Medications:  Current Outpatient Medications:    calcium-vitamin D 250-100 MG-UNIT tablet, Take 1 tablet by mouth daily. Taking calcium 600 mg plus vitamin D once daily,  Disp: , Rfl:    Cholecalciferol (VITAMIN D3 PO), Take 1,000 mg by mouth daily., Disp: , Rfl:    fluconazole (DIFLUCAN) 150 MG tablet, Take 1 now and 1 in 3 days, Disp: 2 tablet, Rfl: 1   olmesartan-hydrochlorothiazide (BENICAR HCT) 20-12.5 MG tablet, Take 1 tablet by mouth daily., Disp: 90 tablet, Rfl: 1   potassium chloride (KLOR-CON) 10 MEQ tablet, Take 1 tablet (10 mEq total) by mouth daily., Disp: 90 tablet, Rfl: 1   tamoxifen (NOLVADEX) 20 MG tablet, Take 1 tablet (20 mg total) by mouth daily., Disp: 90 tablet, Rfl: 3   Allergies: Allergies  Allergen Reactions   Lisinopril Cough   Penicillins Other (See Comments)    UNSPECIFIED REACTION OF CHILDHOOD Has patient had a PCN reaction causing immediate rash, facial/tongue/throat swelling, SOB or lightheadedness with hypotension: Unknown Has patient had a PCN reaction causing severe rash involving mucus membranes or skin necrosis: Unknown Has patient had a PCN reaction that required hospitalization: Unknown Has patient had a PCN reaction occurring within the last 10 years: Unknown If all of the above answers are "NO", then may proceed with Cephalosporin use.    Azithromycin Itching    Itching of skin without rash    REVIEW OF SYSTEMS:   Review of Systems  Constitutional:  Negative for chills, fatigue and fever.  HENT:   Negative for lump/mass, mouth sores, nosebleeds, sore throat and trouble swallowing.   Eyes:  Negative for eye problems.  Respiratory:  Negative for cough and shortness of breath.   Cardiovascular:  Negative for chest pain, leg swelling and palpitations.  Gastrointestinal:  Negative for abdominal pain, constipation, diarrhea, nausea and vomiting.  Genitourinary:  Negative for bladder incontinence, difficulty urinating, dysuria, frequency, hematuria and nocturia.   Musculoskeletal:  Negative for arthralgias, back pain, flank pain, myalgias and neck pain.  Skin:  Negative for itching and rash.  Neurological:  Negative  for dizziness, headaches and numbness.  Hematological:  Does not bruise/bleed easily.  Psychiatric/Behavioral:  Negative  for depression, sleep disturbance and suicidal ideas. The patient is not nervous/anxious.   All other systems reviewed and are negative.    VITALS:   Blood pressure 127/70, pulse 83, temperature 98.9 F (37.2 C), temperature source Oral, resp. rate 17, height 5\' 3"  (1.6 m), weight 190 lb 3.2 oz (86.3 kg), SpO2 98 %.  Wt Readings from Last 3 Encounters:  11/18/22 190 lb 3.2 oz (86.3 kg)  10/30/22 190 lb 6.4 oz (86.4 kg)  05/20/22 188 lb 4.8 oz (85.4 kg)    Body mass index is 33.69 kg/m.  Performance status (ECOG): 0 - Asymptomatic  PHYSICAL EXAM:   Physical Exam Vitals and nursing note reviewed. Exam conducted with a chaperone present.  Constitutional:      Appearance: Normal appearance.  Abdominal:     Palpations: There is no hepatomegaly or splenomegaly.  Lymphadenopathy:     Upper Body:     Right upper body: No axillary adenopathy.  Neurological:     General: No focal deficit present.     Mental Status: She is alert and oriented to person, place, and time.  Psychiatric:        Mood and Affect: Mood normal.        Behavior: Behavior normal.     LABS:      Latest Ref Rng & Units 05/22/2022    8:14 AM 05/08/2022    2:25 PM 08/25/2019   12:11 PM  CBC  WBC 3.4 - 10.8 x10E3/uL 5.7  6.9  6.8   Hemoglobin 11.1 - 15.9 g/dL 16.1  09.6  04.5   Hematocrit 34.0 - 46.6 % 40.8  41.8  39.4   Platelets 150 - 450 x10E3/uL 256  236  262       Latest Ref Rng & Units 10/30/2022    2:37 PM 05/22/2022    8:14 AM 05/08/2022    2:25 PM  CMP  Glucose 70 - 99 mg/dL 409  811  914   BUN 6 - 23 mg/dL 12  16  13    Creatinine 0.40 - 1.20 mg/dL 7.82  9.56  2.13   Sodium 135 - 145 mEq/L 139  142  128   Potassium 3.5 - 5.1 mEq/L 3.4  4.2  3.3   Chloride 96 - 112 mEq/L 100  102  93   CO2 19 - 32 mEq/L 29  23  26    Calcium 8.4 - 10.5 mg/dL 9.4  9.8  9.5   Total Protein  6.0 - 8.5 g/dL  7.1  7.1   Total Bilirubin 0.0 - 1.2 mg/dL  0.3  0.3   Alkaline Phos 44 - 121 IU/L  48  35   AST 0 - 40 IU/L  16  16   ALT 0 - 32 IU/L  9  11      No results found for: "CEA1", "CEA" / No results found for: "CEA1", "CEA" No results found for: "PSA1" No results found for: "YQM578" No results found for: "CAN125"  No results found for: "TOTALPROTELP", "ALBUMINELP", "A1GS", "A2GS", "BETS", "BETA2SER", "GAMS", "MSPIKE", "SPEI" No results found for: "TIBC", "FERRITIN", "IRONPCTSAT" Lab Results  Component Value Date   LDH 165 05/08/2022   LDH 155 08/25/2019     STUDIES:   No results found.

## 2022-11-18 ENCOUNTER — Inpatient Hospital Stay: Payer: 59 | Attending: Hematology | Admitting: Hematology

## 2022-11-18 VITALS — BP 127/70 | HR 83 | Temp 98.9°F | Resp 17 | Ht 63.0 in | Wt 190.2 lb

## 2022-11-18 DIAGNOSIS — D0512 Intraductal carcinoma in situ of left breast: Secondary | ICD-10-CM | POA: Insufficient documentation

## 2022-11-18 DIAGNOSIS — Z79810 Long term (current) use of selective estrogen receptor modulators (SERMs): Secondary | ICD-10-CM | POA: Diagnosis not present

## 2022-11-18 DIAGNOSIS — Z87891 Personal history of nicotine dependence: Secondary | ICD-10-CM | POA: Insufficient documentation

## 2022-11-18 DIAGNOSIS — Z923 Personal history of irradiation: Secondary | ICD-10-CM | POA: Diagnosis not present

## 2022-11-18 DIAGNOSIS — Z79899 Other long term (current) drug therapy: Secondary | ICD-10-CM | POA: Insufficient documentation

## 2022-11-18 NOTE — Patient Instructions (Signed)
[facility]  Discharge Instructions  You were seen and examined today by Dr. Ellin Saba.  You may stop Tamoxifen. You have completed 5 years.  Continue yearly mammograms. You may follow-up with Judeth Cornfield.  Call the Cancer Center if you need [place] in the future.  Thank you for choosing [facility] to provide your oncology and hematology care.   To afford each patient quality time with our provider, please arrive at least 15 minutes before your scheduled appointment time. You may need to reschedule your appointment if you arrive late (10 or more minutes). Arriving late affects you and other patients whose appointments are after yours.  Also, if you miss three or more appointments without notifying the office, you may be dismissed from the clinic at the provider's discretion.    Again, thank you for choosing [facility].  Our hope is that these requests will decrease the amount of time that you wait before being seen by our physicians.   If you have a lab appointment with the Cancer Center - please note that after April 8th, all labs will be drawn in the cancer center.  You do not have to check in or register with the main entrance as you have in the past but will complete your check-in at the cancer center.            _____________________________________________________________  Should you have questions after your visit to [facility], please contact our office at [phone] and follow the prompts.  Our office hours are 8:00 a.m. to 4:30 p.m. Monday - Thursday and 8:00 a.m. to 2:30 p.m. Friday.  Please note that voicemails left after 4:00 p.m. may not be returned until the following business day.  We are closed weekends and all major holidays.  You do have access to a nurse 24-7, just call the main number to the clinic [phone] and do not press any options, hold on the line and a nurse will answer the phone.     For prescription refill requests, have your pharmacy contact our office and allow 72 hours.    Masks are no longer required in the cancer centers. If you would like for your care team to wear a mask while they are taking care of you, please let them know. You may have one support person who is at least 52 years old accompany you for your appointments.

## 2022-11-24 ENCOUNTER — Encounter: Payer: Self-pay | Admitting: Family

## 2022-11-26 ENCOUNTER — Encounter: Payer: Self-pay | Admitting: Family

## 2022-11-27 ENCOUNTER — Encounter: Payer: Self-pay | Admitting: Adult Health

## 2022-11-27 ENCOUNTER — Ambulatory Visit (INDEPENDENT_AMBULATORY_CARE_PROVIDER_SITE_OTHER): Payer: 59 | Admitting: Adult Health

## 2022-11-27 ENCOUNTER — Other Ambulatory Visit (HOSPITAL_COMMUNITY)
Admission: RE | Admit: 2022-11-27 | Discharge: 2022-11-27 | Disposition: A | Discharge status: Home / Self Care | Payer: 59 | Source: Ambulatory Visit | Attending: Adult Health | Admitting: Adult Health

## 2022-11-27 ENCOUNTER — Ambulatory Visit: Payer: 59 | Admitting: Adult Health

## 2022-11-27 VITALS — BP 120/75 | HR 83 | Ht 63.0 in | Wt 192.0 lb

## 2022-11-27 DIAGNOSIS — Z01419 Encounter for gynecological examination (general) (routine) without abnormal findings: Secondary | ICD-10-CM

## 2022-11-27 DIAGNOSIS — Z1211 Encounter for screening for malignant neoplasm of colon: Secondary | ICD-10-CM | POA: Diagnosis not present

## 2022-11-27 DIAGNOSIS — N951 Menopausal and female climacteric states: Secondary | ICD-10-CM

## 2022-11-27 DIAGNOSIS — D0512 Intraductal carcinoma in situ of left breast: Secondary | ICD-10-CM | POA: Diagnosis not present

## 2022-11-27 LAB — HEMOCCULT GUIAC POC 1CARD (OFFICE): Fecal Occult Blood, POC: NEGATIVE

## 2022-11-27 NOTE — Progress Notes (Signed)
Patient ID: Sara Freeman, female   DOB: 13-Feb-1971, 52 y.o.   MRN: [id number] History of Present Illness: Sara Freeman is a 52 year old white female,married, G1P1001, in for a well woman gyn exam and pap. She has finished tamoxifen and does not see Dr Kirtland Bouchard any more. She had breast cancer(DCIS left breast) in 2019.  PCP is Dulce Sellar NP   Current Medications, Allergies, Past Medical History, Past Surgical History, Family History and Social History were reviewed in [employer] record.     Review of Systems: Patient denies any headaches, hearing loss, fatigue, blurred vision, shortness of breath, chest pain, abdominal pain, problems with bowel movements, urination, or intercourse. No joint pain or mood swings.  She is skipping periods    Physical Exam:BP 120/75 (BP Location: Left Arm, Patient Position: Sitting, Cuff Size: Normal)   Pulse 83   Ht 5\' 3"  (1.6 m)   Wt 192 lb (87.1 kg)   LMP 11/04/2022 (Approximate) Comment: prior period in December  BMI 34.01 kg/m   General:  Well developed, well nourished, no acute distress Skin:  Warm and dry Neck:  Midline trachea, normal thyroid, good ROM, no lymphadenopathy Lungs; Clear to auscultation bilaterally Breast:  No dominant palpable mass, retraction, or nipple discharge Cardiovascular: Regular rate and rhythm Abdomen:  Soft, non tender, no hepatosplenomegaly Pelvic:  External genitalia is normal in appearance, no lesions.  The vagina is normal in appearance. Urethra has no lesions or masses. The cervix is smooth, pap with HR HPV genotyping performed.  Uterus is felt to be normal size, shape, and contour.  No adnexal masses or tenderness noted.Bladder is non tender, no masses felt. Rectal: Good sphincter tone, no polyps, or hemorrhoids felt.  Hemoccult negative. Extremities/musculoskeletal:  No swelling or varicosities noted, no clubbing or cyanosis Psych:  No mood changes, alert and cooperative,seems happy AA is 0 Fall risk  is low    11/27/2022   12:13 PM 11/14/2021   12:38 PM 03/28/2021   11:26 AM  Depression screen PHQ 2/9  Decreased Interest 0 0 0  Down, Depressed, Hopeless 0 0 0  PHQ - 2 Score 0 0 0  Altered sleeping 0 0   Tired, decreased energy 0 0   Change in appetite 0 0   Feeling bad or failure about yourself  0 0   Trouble concentrating 0 0   Moving slowly or fidgety/restless 0 0   Suicidal thoughts 0 0   PHQ-9 Score 0 0        11/27/2022   12:14 PM 11/14/2021   12:38 PM 09/27/2020   12:23 PM  GAD 7 : Generalized Anxiety Score  Nervous, Anxious, on Edge 0 0 0  Control/stop worrying 0 0 0  Worry too much - different things 0 0 0  Trouble relaxing 0 0 0  Restless 0 0 0  Easily annoyed or irritable 0 0 0  Afraid - awful might happen 0 0 0  Total GAD 7 Score 0 0 0      Upstream - 11/27/22 1218       Pregnancy Intention Screening   Does the patient want to become pregnant in the next year? No    Does the patient's partner want to become pregnant in the next year? No    Would the patient like to discuss contraceptive options today? No      Contraception Wrap Up   Current Method Female Sterilization    End Method Female Sterilization  Contraception Counseling Provided No            Examination chaperoned by Malachy Mood LPN   Impression and Plan: 1. Encounter for gynecological examination with Papanicolaou smear of cervix Pap was sent Pap in 3 years if normal Physical in 1 year Mammogram was negative 09/18/22 Labs with PCP - Cytology - PAP( [facility])  2. Encounter for screening fecal occult blood testing Hemoccult was negative  - POCT occult blood stool  3. Ductal carcinoma in situ (DCIS) of left breast  4. Perimenopause Skipping periods

## 2022-12-01 LAB — CYTOLOGY - PAP
Comment: NEGATIVE
Diagnosis: NEGATIVE
High risk HPV: NEGATIVE

## 2022-12-04 ENCOUNTER — Ambulatory Visit: Payer: 59 | Admitting: Adult Health

## 2022-12-21 ENCOUNTER — Other Ambulatory Visit: Payer: Self-pay

## 2022-12-21 ENCOUNTER — Other Ambulatory Visit (HOSPITAL_COMMUNITY): Payer: Self-pay

## 2023-01-01 ENCOUNTER — Ambulatory Visit: Payer: 59 | Admitting: Internal Medicine

## 2023-02-15 ENCOUNTER — Other Ambulatory Visit (HOSPITAL_COMMUNITY): Payer: Self-pay

## 2023-02-15 ENCOUNTER — Other Ambulatory Visit: Payer: Self-pay

## 2023-02-15 ENCOUNTER — Telehealth: Payer: 59 | Admitting: Physician Assistant

## 2023-02-15 DIAGNOSIS — J019 Acute sinusitis, unspecified: Secondary | ICD-10-CM | POA: Diagnosis not present

## 2023-02-15 DIAGNOSIS — B9789 Other viral agents as the cause of diseases classified elsewhere: Secondary | ICD-10-CM

## 2023-02-15 MED ORDER — AZELASTINE HCL 0.1 % NA SOLN
1.0000 | Freq: Two times a day (BID) | NASAL | 0 refills | Status: DC
Start: 2023-02-15 — End: 2023-05-07
  Filled 2023-02-15: qty 30, 90d supply, fill #0

## 2023-02-15 NOTE — Progress Notes (Signed)
E-Visit for Sinus Problems  We are sorry that you are not feeling well.  Here is how we plan to help!  Based on what you have shared with me it looks like you have sinusitis.  Sinusitis is inflammation and infection in the sinus cavities of the head.  Based on your presentation I believe you most likely have Acute Viral Sinusitis.This is an infection most likely caused by a virus. There is not specific treatment for viral sinusitis other than to help you with the symptoms until the infection runs its course.  You may use an oral decongestant such as Mucinex D or if you have glaucoma or high blood pressure use plain Mucinex. Saline nasal spray help and can safely be used as often as needed for congestion, I have prescribed: Azelastine nasal spray 2 sprays in each nostril twice a day  Some authorities believe that zinc sprays or the use of Echinacea may shorten the course of your symptoms.  Sinus infections are not as easily transmitted as other respiratory infection, however we still recommend that you avoid close contact with loved ones, especially the very young and elderly.  Remember to wash your hands thoroughly throughout the day as this is the number one way to prevent the spread of infection!  We have seen quite an increase in Covid 19 cases again. I would recommend to take an at home test to rule this out as well.   Home Care: Only take medications as instructed by your medical team. Do not take these medications with alcohol. A steam or ultrasonic humidifier can help congestion.  You can place a towel over your head and breathe in the steam from hot water coming from a faucet. Avoid close contacts especially the very young and the elderly. Cover your mouth when you cough or sneeze. Always remember to wash your hands.  Get Help Right Away If: You develop worsening fever or sinus pain. You develop a severe head ache or visual changes. Your symptoms persist after you have completed your  treatment plan.  Make sure you Understand these instructions. Will watch your condition. Will get help right away if you are not doing well or get worse.   Thank you for choosing an e-visit.  Your e-visit answers were reviewed by a board certified advanced clinical practitioner to complete your personal care plan. Depending upon the condition, your plan could have included both over the counter or prescription medications.  Please review your pharmacy choice. Make sure the pharmacy is open so you can pick up prescription now. If there is a problem, you may contact your provider through [employer] and have the prescription routed to another pharmacy.  Your safety is important to [place]. If you have drug allergies check your prescription carefully.   For the next 24 hours you can use MyChart to ask questions about today's visit, request a non-urgent call back, or ask for a work or school excuse. You will get an email in the next two days asking about your experience. I hope that your e-visit has been valuable and will speed your recovery.  I have spent 5 minutes in review of e-visit questionnaire, review and updating patient chart, medical decision making and response to patient.   Margaretann Loveless, PA-C

## 2023-03-05 ENCOUNTER — Ambulatory Visit: Payer: 59 | Attending: Internal Medicine | Admitting: Internal Medicine

## 2023-03-05 ENCOUNTER — Encounter: Payer: Self-pay | Admitting: Internal Medicine

## 2023-03-05 VITALS — BP 100/64 | HR 110 | Ht 62.0 in | Wt 195.0 lb

## 2023-03-05 DIAGNOSIS — Z8489 Family history of other specified conditions: Secondary | ICD-10-CM | POA: Diagnosis not present

## 2023-03-05 DIAGNOSIS — Z8241 Family history of sudden cardiac death: Secondary | ICD-10-CM

## 2023-03-05 DIAGNOSIS — I10 Essential (primary) hypertension: Secondary | ICD-10-CM

## 2023-03-05 DIAGNOSIS — Z8249 Family history of ischemic heart disease and other diseases of the circulatory system: Secondary | ICD-10-CM

## 2023-03-05 NOTE — Progress Notes (Signed)
Cardiology Office Note  Date: 03/05/2023   ID: [name], DOB [birth year], MRN [id number]  PCP:  Dulce Sellar, NP  Cardiologist:  Marjo Bicker, MD Electrophysiologist:  None   History of Present Illness: Sara Freeman is a 52 y.o. female known to have HTN, family history of sudden cardiac death was referred to cardiology clinic for evaluation of same.  Patient's brother passed away in 01/30/24with sudden cardiac death.  He is complaining of chest pain and suddenly collapsed in the kitchen after a few minutes.  EMS arrived and CPR for more than 30 minutes but was not able to bring him back.  Patient's father passed away in the early 2000's with similar symptoms.  Patient's father side has significant CAD history, her paternal uncles had massive heart attack with stents.  Mother is healthy.  No heart issues.  Patient herself denies any symptoms of angina, DOE, orthopnea, PND, leg swelling, dizziness, presyncope, syncope.  EKG today in the clinic showed sinus tachycardia, normal QTc interval.  She said she went to [facility] before coming to the cardiology clinic.  Prior EKG also showed normal QTc interval and NSR.  Past Medical History:  Diagnosis Date   Allergy    Breast cancer (HCC)    Breast cancer (HCC) 09/09/2017   Breast disorder    cancer   Class 1 obesity due to excess calories without serious comorbidity with body mass index (BMI) of 32.0 to 32.9 in adult 04/05/2020   Encounter for colorectal cancer screening 04/21/2019   Encounter for general adult medical examination with abnormal findings 09/27/2020   Encounter for gynecological examination with Papanicolaou smear of cervix 09/27/2020   Encounter for screening fecal occult blood testing 11/14/2021   Encounter for well woman exam with routine gynecological exam 04/21/2019   Endometriosis    Family history of breast cancer    Family history of heart disease in female family member before age 53 05/28/2017   Genetic  testing 09/10/2017   Patient had genetic counseling for Hereditary Predisposition to cancer on 09/09/2017. The patient declined genetic testing at this time.    Personal history of radiation therapy     Past Surgical History:  Procedure Laterality Date   BREAST BIOPSY Left 2017   benign   BREAST BIOPSY Left 08/27/2017   Needle core biopsy   BREAST LUMPECTOMY Left 10/19/2017   Procedure: LEFT BREAST RE EXCISION LUMPECTOMY;  Surgeon: Harriette Bouillon, MD;  Location:  S[facility]ERY [facility];  Service: General;  Laterality: Left;   BREAST LUMPECTOMY WITH RADIOACTIVE SEED LOCALIZATION Left 09/22/2017   Procedure: BREAST LUMPECTOMY  WITH RADIOACTIVE SEED LOCALIZATION X 2;  Surgeon: Harriette Bouillon, MD;  [facility];  Service: General;  Laterality: Left;   BREAST SURGERY N/A    [facility] 04/02/2020   TUBAL LIGATION  1999   [facility]    Current Outpatient Medications  Medication Sig Dispense Refill   azelastine (ASTELIN) 0.1 % nasal spray Place 1 spray into both nostrils 2 (two) times daily. Use in each nostril as directed 30 mL 0   calcium-vitamin D 250-100 MG-UNIT tablet Take 1 tablet by mouth daily. Taking calcium 600 mg plus vitamin D once daily     Cholecalciferol (VITAMIN D3 PO) Take 1,000 mg by mouth daily.     olmesartan-hydrochlorothiazide (BENICAR HCT) 20-12.5 MG tablet Take 1 tablet by mouth daily. 90 tablet 1   potassium chloride (KLOR-CON) 10 MEQ tablet Take 1 tablet (10 mEq total) by mouth daily.  90 tablet 1   UNABLE TO FIND Estroven sleep cool-qhs     No current facility-administered medications for this visit.   Allergies:  Lisinopril, Penicillins, and Azithromycin   Social History: The patient  reports that she quit smoking about 16 years ago. Her smoking use included cigarettes. She started smoking about 36 years ago. She has a 20 pack-year smoking history. She has never used smokeless tobacco. She reports that she does not drink alcohol and does not use drugs.   Family  History: The patient's family history includes Aneurysm in her maternal grandfather; CAD in an other family member; COPD in her father; Heart attack in her brother and father; Heart disease in her father and another family member; Hypertension in her brother, father, and mother; Stroke in her paternal grandfather, paternal grandmother, and paternal uncle; Thyroid disease in her mother and son.   ROS:  Please see the history of present illness. Otherwise, complete review of systems is positive for none.  All other systems are reviewed and negative.   Physical Exam: VS:  Ht 5\' 2"  (1.575 m)   Wt 195 lb (88.5 kg)   BMI 35.67 kg/m , BMI Body mass index is 35.67 kg/m.  Wt Readings from Last 3 Encounters:  03/05/23 195 lb (88.5 kg)  11/27/22 192 lb (87.1 kg)  11/18/22 190 lb 3.2 oz (86.3 kg)    General: Patient appears comfortable at rest. HEENT: Conjunctiva and lids normal, oropharynx clear with moist mucosa. Neck: Supple, no elevated JVP or carotid bruits, no thyromegaly. Lungs: Clear to auscultation, nonlabored breathing at rest. Cardiac: Regular rate and rhythm, no S3 or significant systolic murmur, no pericardial rub. Abdomen: Soft, nontender, no hepatomegaly, bowel sounds present, no guarding or rebound. Extremities: No pitting edema, distal pulses 2+. Skin: Warm and dry. Musculoskeletal: No kyphosis. Neuropsychiatric: Alert and oriented x3, affect grossly appropriate.  Recent Labwork: 05/22/2022: ALT 9; AST 16; Hemoglobin 13.6; Platelets 256; TSH 1.590 10/30/2022: BUN 12; Creatinine, Ser 0.85; Potassium 3.4; Sodium 139     Component Value Date/Time   CHOL 177 10/30/2022 1437   CHOL 180 04/11/2021 0823   TRIG 119.0 10/30/2022 1437   HDL 59.80 10/30/2022 1437   HDL 62 04/11/2021 0823   CHOLHDL 3 10/30/2022 1437   VLDL 23.8 10/30/2022 1437   LDLCALC 93 10/30/2022 1437   LDLCALC 91 04/11/2021 0823   LDLCALC 101 (H) 05/28/2017 1423     Assessment and Plan:   Family history of  sudden cardiac death, CAD: Patient's brother passed away when he was 22 years old, in 07/2022 and patient's father passed away in early 2000's when he was 52 years old, both due to massive heart attacks requiring CPR. Cause of death still unknown.  EKG showed NSR, sinus tachycardia, normal QTc interval I reviewed the lipid panel which was within normal limits, normal LDL and TG.  Obtain lipoprotein a and CT calcium scoring of coronaries. I will also obtain 2D echocardiogram.  Diet and exercise counseling provided.  HTN, controlled: Continue olmesartan-HCTZ 20-12.5 mg once daily.  Follows up with PCP.   Medication Adjustments/Labs and Tests Ordered: Current medicines are reviewed at length with the patient today.  Concerns regarding medicines are outlined above.    Disposition:  Follow up pending results  Signed, Berthel Bagnall Verne Spurr, MD, 03/05/2023 2:10 PM    [facility] at [facility] [address] [place], [place] [address]

## 2023-03-05 NOTE — Patient Instructions (Signed)
Medication Instructions:  Your physician recommends that you continue on your current medications as directed. Please refer to the Current Medication list given to you today.  *If you need a refill on your cardiac medications before your next appointment, please call your pharmacy*   Lab Work: LP(a)  If you have labs (blood work) drawn today and your tests are completely normal, you will receive your results only by: MyChart Message (if you have MyChart) OR A paper copy in the mail If you have any lab test that is abnormal or we need to change your treatment, we will call you to review the results.   Testing/Procedures: Your physician has requested that you have an echocardiogram. Echocardiography is a painless test that uses sound waves to create images of your heart. It provides your doctor with information about the size and shape of your heart and how well your heart's chambers and valves are working. This procedure takes approximately one hour. There are no restrictions for this procedure. Please do NOT wear cologne, perfume, aftershave, or lotions (deodorant is allowed). Please arrive 15 minutes prior to your appointment time.  Self Pay Cardiac Calcium CT   Follow-Up: At [facility], you and your health needs are our priority.  As part of our continuing mission to provide you with exceptional heart care, we have created designated Provider Care Teams.  These Care Teams include your primary Cardiologist (physician) and Advanced Practice Providers (APPs -  Physician Assistants and Nurse Practitioners) who all work together to provide you with the care you need, when you need it.  We recommend signing up for the patient portal called "MyChart".  Sign up information is provided on this After Visit Summary.  MyChart is used to connect with patients for Virtual Visits (Telemedicine).  Patients are able to view lab/test results, encounter notes, upcoming appointments, etc.   Non-urgent messages can be sent to your provider as well.   To learn more about what you can do with MyChart, go to [email or web address].    Your next appointment:    Pending Testing Results  Provider:   Luane School, MD    Other Instructions

## 2023-03-15 LAB — LIPOPROTEIN A (LPA): Lipoprotein (a): 8.6 nmol/L (ref <75.0)

## 2023-03-22 ENCOUNTER — Other Ambulatory Visit (HOSPITAL_COMMUNITY): Payer: Self-pay

## 2023-03-22 ENCOUNTER — Other Ambulatory Visit: Payer: Self-pay | Admitting: Family

## 2023-03-22 DIAGNOSIS — I10 Essential (primary) hypertension: Secondary | ICD-10-CM

## 2023-03-23 ENCOUNTER — Other Ambulatory Visit: Payer: Self-pay

## 2023-03-24 ENCOUNTER — Other Ambulatory Visit: Payer: Self-pay

## 2023-03-24 ENCOUNTER — Other Ambulatory Visit (HOSPITAL_COMMUNITY): Payer: Self-pay

## 2023-03-24 MED ORDER — POTASSIUM CHLORIDE ER 10 MEQ PO TBCR
10.0000 meq | EXTENDED_RELEASE_TABLET | Freq: Every day | ORAL | 1 refills | Status: DC
Start: 2023-03-24 — End: 2023-09-12
  Filled 2023-03-24: qty 90, 90d supply, fill #0
  Filled 2023-06-13 – 2023-06-20 (×2): qty 90, 90d supply, fill #1

## 2023-04-09 ENCOUNTER — Ambulatory Visit (HOSPITAL_COMMUNITY)
Admission: RE | Admit: 2023-04-09 | Discharge: 2023-04-09 | Disposition: A | Discharge status: Home / Self Care | Payer: 59 | Source: Ambulatory Visit | Attending: Internal Medicine | Admitting: Internal Medicine

## 2023-04-09 DIAGNOSIS — I10 Essential (primary) hypertension: Secondary | ICD-10-CM | POA: Diagnosis not present

## 2023-04-09 DIAGNOSIS — Z853 Personal history of malignant neoplasm of breast: Secondary | ICD-10-CM | POA: Insufficient documentation

## 2023-04-09 DIAGNOSIS — Z8249 Family history of ischemic heart disease and other diseases of the circulatory system: Secondary | ICD-10-CM | POA: Insufficient documentation

## 2023-04-09 DIAGNOSIS — Z8489 Family history of other specified conditions: Secondary | ICD-10-CM | POA: Insufficient documentation

## 2023-04-09 LAB — ECHOCARDIOGRAM COMPLETE
AR max vel: 2.67 cm2
AV Area VTI: 2.94 cm2
AV Area mean vel: 2.67 cm2
AV Mean grad: 4 mmHg
AV Peak grad: 7.7 mmHg
Ao pk vel: 1.39 m/s
Area-P 1/2: 4.49 cm2
S' Lateral: 2.3 cm

## 2023-04-09 NOTE — Progress Notes (Signed)
*  PRELIMINARY RESULTS* Echocardiogram 2D Echocardiogram has been performed.  Stacey Drain 04/09/2023, 3:54 PM

## 2023-04-16 ENCOUNTER — Ambulatory Visit (HOSPITAL_COMMUNITY)
Admission: RE | Admit: 2023-04-16 | Discharge: 2023-04-16 | Disposition: A | Discharge status: Home / Self Care | Payer: 59 | Source: Ambulatory Visit | Attending: Internal Medicine | Admitting: Internal Medicine

## 2023-04-16 DIAGNOSIS — Z8249 Family history of ischemic heart disease and other diseases of the circulatory system: Secondary | ICD-10-CM | POA: Insufficient documentation

## 2023-04-23 ENCOUNTER — Other Ambulatory Visit: Payer: Self-pay | Admitting: Family

## 2023-04-23 DIAGNOSIS — Z1212 Encounter for screening for malignant neoplasm of rectum: Secondary | ICD-10-CM

## 2023-04-23 DIAGNOSIS — Z1211 Encounter for screening for malignant neoplasm of colon: Secondary | ICD-10-CM

## 2023-05-07 ENCOUNTER — Ambulatory Visit (INDEPENDENT_AMBULATORY_CARE_PROVIDER_SITE_OTHER): Payer: 59 | Admitting: Family

## 2023-05-07 ENCOUNTER — Other Ambulatory Visit (HOSPITAL_COMMUNITY): Payer: Self-pay

## 2023-05-07 VITALS — BP 115/79 | HR 84 | Temp 97.2°F | Ht 62.0 in | Wt 197.0 lb

## 2023-05-07 DIAGNOSIS — Z1159 Encounter for screening for other viral diseases: Secondary | ICD-10-CM

## 2023-05-07 DIAGNOSIS — I10 Essential (primary) hypertension: Secondary | ICD-10-CM | POA: Diagnosis not present

## 2023-05-07 LAB — COMPREHENSIVE METABOLIC PANEL
ALT: 17 U/L (ref 0–35)
AST: 17 U/L (ref 0–37)
Albumin: 4 g/dL (ref 3.5–5.2)
Alkaline Phosphatase: 55 U/L (ref 39–117)
BUN: 15 mg/dL (ref 6–23)
CO2: 31 meq/L (ref 19–32)
Calcium: 9.5 mg/dL (ref 8.4–10.5)
Chloride: 100 meq/L (ref 96–112)
Creatinine, Ser: 0.74 mg/dL (ref 0.40–1.20)
GFR: 93.35 mL/min (ref >60.00)
Glucose, Bld: 101 mg/dL — ABNORMAL HIGH (ref 70–99)
Potassium: 3.5 meq/L (ref 3.5–5.1)
Sodium: 139 meq/L (ref 135–145)
Total Bilirubin: 0.3 mg/dL (ref 0.2–1.2)
Total Protein: 7.1 g/dL (ref 6.0–8.3)

## 2023-05-07 MED ORDER — OLMESARTAN MEDOXOMIL-HCTZ 20-12.5 MG PO TABS
1.0000 | ORAL_TABLET | Freq: Every day | ORAL | 1 refills | Status: DC
  Filled 2023-05-07 – 2023-06-20 (×3): qty 90, 90d supply, fill #0
  Filled 2023-09-12: qty 90, 90d supply, fill #1

## 2023-05-07 NOTE — Assessment & Plan Note (Signed)
chronic stable on Olmesartan/HCTZ 20-12.5mg  refilling today checking CMP today f/u in 6 mos

## 2023-05-07 NOTE — Progress Notes (Signed)
Patient ID: Sara Freeman, female    DOB: 1971/03/01, 52 y.o.   MRN: [id number]  Chief Complaint  Patient presents with   Hypertension    6 month follow up  HPI: Hypertension: Patient is currently maintained on the following medications for blood pressure: Olmesartan-HCTZ 20-12.5mg  qd. Pt states her brother died in 08-25-22 suddenly, states he was mildly obese, no DM, unsure if HTN, dyslipidemia, also states her father died at a young age of sudden MI, she is concerned about her risks. Failed meds include: none. Patient reports good compliance with blood pressure medications. Patient denies chest pain, headaches, shortness of breath or swelling. Last 3 blood pressure readings in our office are as follows: BP Readings from Last 3 Encounters:  05/07/23 115/79  03/05/23 100/64  11/27/22 120/75    Assessment & Plan:  Primary hypertension Assessment & Plan: chronic stable on Olmesartan/HCTZ 20-12.5mg  refilling today checking CMP today f/u in 6 mos  Orders: -     Olmesartan Medoxomil-HCTZ; Take 1 tablet by mouth daily.  Dispense: 90 tablet; Refill: 1 -     Comprehensive metabolic panel  Need for hepatitis C screening test -     Hepatitis C antibody   Subjective:    Outpatient Medications Prior to Visit  Medication Sig Dispense Refill   calcium-vitamin D 250-100 MG-UNIT tablet Take 1 tablet by mouth daily. Taking calcium 600 mg plus vitamin D once daily     Cholecalciferol (VITAMIN D3 PO) Take 1,000 mg by mouth daily.     potassium chloride (KLOR-CON) 10 MEQ tablet Take 1 tablet (10 mEq total) by mouth daily. 90 tablet 1   olmesartan-hydrochlorothiazide (BENICAR HCT) 20-12.5 MG tablet Take 1 tablet by mouth daily. 90 tablet 1   azelastine (ASTELIN) 0.1 % nasal spray Place 1 spray into both nostrils 2 (two) times daily. Use in each nostril as directed 30 mL 0   UNABLE TO FIND Estroven sleep cool-qhs     No facility-administered medications prior to visit.   Past Medical History:   Diagnosis Date   Allergy    Breast cancer (HCC)    Breast cancer (HCC) 09/09/2017   Breast disorder    cancer   Class 1 obesity due to excess calories without serious comorbidity with body mass index (BMI) of 32.0 to 32.9 in adult 04/05/2020   Encounter for colorectal cancer screening 04/21/2019   Encounter for general adult medical examination with abnormal findings 09/27/2020   Encounter for gynecological examination with Papanicolaou smear of cervix 09/27/2020   Encounter for screening fecal occult blood testing 11/14/2021   Encounter for well woman exam with routine gynecological exam 04/21/2019   Endometriosis    Family history of breast cancer    Family history of heart disease in female family member before age 54 05/28/2017   Genetic testing 09/10/2017   Patient had genetic counseling for Hereditary Predisposition to cancer on 09/09/2017. The patient declined genetic testing at this time.    Personal history of radiation therapy    Past Surgical History:  Procedure Laterality Date   BREAST BIOPSY Left 2017   benign   BREAST BIOPSY Left 08/27/2017   Needle core biopsy   BREAST LUMPECTOMY Left 10/19/2017   Procedure: LEFT BREAST RE EXCISION LUMPECTOMY;  Surgeon: Harriette Bouillon, MD;  Location: [facility];  Service: General;  Laterality: Left;   BREAST LUMPECTOMY WITH RADIOACTIVE SEED LOCALIZATION Left 09/22/2017   Procedure: BREAST LUMPECTOMY  WITH RADIOACTIVE SEED LOCALIZATION X 2;  Surgeon:  Harriette Bouillon, MD;  Location: [facility] OR;  Service: General;  Laterality: Left;   BREAST SURGERY N/A    [employer] 04/02/2020   TUBAL LIGATION  1999   [facility]   Allergies  Allergen Reactions   Lisinopril Cough   Penicillins Other (See Comments)    UNSPECIFIED REACTION OF CHILDHOOD Has patient had a PCN reaction causing immediate rash, facial/tongue/throat swelling, SOB or lightheadedness with hypotension: Unknown Has patient had a PCN reaction causing severe rash involving mucus  membranes or skin necrosis: Unknown Has patient had a PCN reaction that required hospitalization: Unknown Has patient had a PCN reaction occurring within the last 10 years: Unknown If all of the above answers are "NO", then may proceed with Cephalosporin use.    Azithromycin Itching    Itching of skin without rash      Objective:    Physical Exam Vitals and nursing note reviewed.  Constitutional:      Appearance: Normal appearance.  Cardiovascular:     Rate and Rhythm: Normal rate and regular rhythm.  Pulmonary:     Effort: Pulmonary effort is normal.     Breath sounds: Normal breath sounds.  Musculoskeletal:        General: Normal range of motion.  Skin:    General: Skin is warm and dry.  Neurological:     Mental Status: She is alert.  Psychiatric:        Mood and Affect: Mood normal.        Behavior: Behavior normal.    BP 115/79 (BP Location: Left Arm, Patient Position: Sitting, Cuff Size: Normal)   Pulse 84   Temp (!) 97.2 F (36.2 C) (Temporal)   Ht 5\' 2"  (1.575 m)   Wt 197 lb (89.4 kg)   SpO2 99%   BMI 36.03 kg/m  Wt Readings from Last 3 Encounters:  05/07/23 197 lb (89.4 kg)  03/05/23 195 lb (88.5 kg)  11/27/22 192 lb (87.1 kg)       Dulce Sellar, NP

## 2023-05-08 LAB — HEPATITIS C ANTIBODY: Hepatitis C Ab: NONREACTIVE

## 2023-05-27 ENCOUNTER — Other Ambulatory Visit: Payer: Self-pay | Admitting: Family

## 2023-05-27 DIAGNOSIS — Z1231 Encounter for screening mammogram for malignant neoplasm of breast: Secondary | ICD-10-CM

## 2023-05-30 DIAGNOSIS — Z1212 Encounter for screening for malignant neoplasm of rectum: Secondary | ICD-10-CM | POA: Diagnosis not present

## 2023-05-30 DIAGNOSIS — Z1211 Encounter for screening for malignant neoplasm of colon: Secondary | ICD-10-CM | POA: Diagnosis not present

## 2023-06-06 LAB — COLOGUARD: COLOGUARD: NEGATIVE

## 2023-06-14 ENCOUNTER — Other Ambulatory Visit: Payer: Self-pay

## 2023-06-20 ENCOUNTER — Other Ambulatory Visit (HOSPITAL_COMMUNITY): Payer: Self-pay

## 2023-06-21 ENCOUNTER — Other Ambulatory Visit (HOSPITAL_COMMUNITY): Payer: Self-pay

## 2023-07-09 ENCOUNTER — Other Ambulatory Visit: Payer: Self-pay

## 2023-07-09 ENCOUNTER — Telehealth: Payer: 59 | Admitting: Physician Assistant

## 2023-07-09 DIAGNOSIS — R3989 Other symptoms and signs involving the genitourinary system: Secondary | ICD-10-CM

## 2023-07-09 MED ORDER — NITROFURANTOIN MONOHYD MACRO 100 MG PO CAPS
100.0000 mg | ORAL_CAPSULE | Freq: Two times a day (BID) | ORAL | 0 refills | Status: DC
Start: 2023-07-09 — End: 2023-11-12
  Filled 2023-07-09: qty 10, 5d supply, fill #0

## 2023-07-09 NOTE — Progress Notes (Signed)

## 2023-09-12 ENCOUNTER — Other Ambulatory Visit: Payer: Self-pay | Admitting: Family

## 2023-09-12 DIAGNOSIS — I10 Essential (primary) hypertension: Secondary | ICD-10-CM

## 2023-09-13 ENCOUNTER — Other Ambulatory Visit: Payer: Self-pay

## 2023-09-13 ENCOUNTER — Other Ambulatory Visit (HOSPITAL_COMMUNITY): Payer: Self-pay

## 2023-09-14 ENCOUNTER — Encounter: Payer: Self-pay | Admitting: Family

## 2023-09-14 ENCOUNTER — Other Ambulatory Visit: Payer: Self-pay | Admitting: Family

## 2023-09-14 MED ORDER — OSELTAMIVIR PHOSPHATE 75 MG PO CAPS
75.0000 mg | ORAL_CAPSULE | Freq: Every day | ORAL | 0 refills | Status: DC
  Filled 2023-09-14: qty 10, 10d supply, fill #0

## 2023-09-15 ENCOUNTER — Other Ambulatory Visit (HOSPITAL_COMMUNITY): Payer: Self-pay

## 2023-09-15 ENCOUNTER — Other Ambulatory Visit: Payer: Self-pay

## 2023-09-17 ENCOUNTER — Other Ambulatory Visit: Payer: Self-pay

## 2023-09-17 MED ORDER — POTASSIUM CHLORIDE ER 10 MEQ PO TBCR
10.0000 meq | EXTENDED_RELEASE_TABLET | Freq: Every day | ORAL | 1 refills | Status: DC
  Filled 2023-09-17: qty 90, 90d supply, fill #0

## 2023-09-24 ENCOUNTER — Ambulatory Visit
Admission: RE | Admit: 2023-09-24 | Discharge: 2023-09-24 | Disposition: A | Discharge status: Home / Self Care | Payer: 59 | Source: Ambulatory Visit | Attending: Family | Admitting: Family

## 2023-09-24 DIAGNOSIS — Z1231 Encounter for screening mammogram for malignant neoplasm of breast: Secondary | ICD-10-CM

## 2023-11-12 ENCOUNTER — Other Ambulatory Visit (HOSPITAL_COMMUNITY): Payer: Self-pay

## 2023-11-12 ENCOUNTER — Ambulatory Visit (INDEPENDENT_AMBULATORY_CARE_PROVIDER_SITE_OTHER): Payer: 59 | Admitting: Family

## 2023-11-12 ENCOUNTER — Encounter: Payer: Self-pay | Admitting: Family

## 2023-11-12 VITALS — BP 100/68 | HR 83 | Temp 98.1°F | Ht 62.0 in | Wt 186.2 lb

## 2023-11-12 DIAGNOSIS — E876 Hypokalemia: Secondary | ICD-10-CM | POA: Diagnosis not present

## 2023-11-12 DIAGNOSIS — E669 Obesity, unspecified: Secondary | ICD-10-CM

## 2023-11-12 DIAGNOSIS — I10 Essential (primary) hypertension: Secondary | ICD-10-CM

## 2023-11-12 LAB — COMPREHENSIVE METABOLIC PANEL WITH GFR
ALT: 13 U/L (ref 0–35)
AST: 18 U/L (ref 0–37)
Albumin: 4.2 g/dL (ref 3.5–5.2)
Alkaline Phosphatase: 60 U/L (ref 39–117)
BUN: 16 mg/dL (ref 6–23)
CO2: 31 meq/L (ref 19–32)
Calcium: 9.5 mg/dL (ref 8.4–10.5)
Chloride: 99 meq/L (ref 96–112)
Creatinine, Ser: 0.78 mg/dL (ref 0.40–1.20)
GFR: 87.32 mL/min (ref >60.00)
Glucose, Bld: 93 mg/dL (ref 70–99)
Potassium: 4.2 meq/L (ref 3.5–5.1)
Sodium: 139 meq/L (ref 135–145)
Total Bilirubin: 0.3 mg/dL (ref 0.2–1.2)
Total Protein: 7.3 g/dL (ref 6.0–8.3)

## 2023-11-12 MED ORDER — OLMESARTAN MEDOXOMIL-HCTZ 20-12.5 MG PO TABS
0.5000 | ORAL_TABLET | Freq: Every morning | ORAL | 1 refills | Status: DC
  Filled 2023-11-12: qty 90, 180d supply, fill #0
  Filled 2024-01-09: qty 45, 90d supply, fill #0
  Filled 2024-05-01: qty 45, 90d supply, fill #1

## 2023-11-12 MED ORDER — POTASSIUM CHLORIDE ER 10 MEQ PO TBCR: 5.0000 meq | EXTENDED_RELEASE_TABLET | Freq: Every day | ORAL | Status: DC

## 2023-11-12 NOTE — Assessment & Plan Note (Signed)
 Hypertension well-controlled with olmesartan -hydrochlorothiazide  20-12.5mg . BP on low end today, advised home blood pressure monitoring, especially if symptomatic. Discussed medication adjustment if blood pressure is consistently low. Weight monitoring advised for potential medication adjustment. - Check kidney fx and electrolytes today - Advise home blood pressure monitoring, especially if symptomatic with dizzy or lightheadedness. - Cut olmesartan -hydrochlorothiazide  pill in half using a pill splitter for accurate dosing. - Advise halving potassium supplement also for now, may discontinue after lab review. - Monitor weight regularly to assess the need for further medication adjustment, calling if BP is < 100/60 or higher than 130/90 either number.

## 2023-11-12 NOTE — Progress Notes (Signed)
 Patient ID: Sara Freeman, female    DOB: [birth year], 53 y.o.   MRN: [id number]  Chief Complaint  Patient presents with   Hypertension  Discussed the use of AI scribe software for clinical note transcription with the patient, who gave verbal consent to proceed.  History of Present Illness The patient, with a history of hypertension, presents for follow up and after an 11 pound weight loss since starting [employer] with her husband in January. She reports feeling well overall, but has not been checking her blood pressure at home. She is currently taking olmesartan  and hydrochlorothiazide  for hypertension and potassium supplementation. She has not been exercising, but may consider it for the additional health benefits.  Assessment & Plan Hypertension Hypertension well-controlled with olmesartan -hydrochlorothiazide  20-12.5mg . BP on low end today, advised home blood pressure monitoring, especially if symptomatic. Discussed medication adjustment if blood pressure is consistently low. Weight monitoring advised for potential medication adjustment. - Check kidney fx and electrolytes today - Advise home blood pressure monitoring, especially if symptomatic with dizzy or lightheadedness. - Cut olmesartan -hydrochlorothiazide  pill in half using a pill splitter for accurate dosing. - Advise halving potassium supplement also for now, may discontinue after lab review. - Monitor weight regularly to assess the need for further medication adjustment, calling if BP is < 100/60 or higher than 130/90 either number. - F/U in 6mos  Obesity - Achieved eleven-pound weight loss since January. Blood pressure well-controlled. Encouraged continued weight loss and exercise for additional health benefits, including Alzheimer's/dementia & many types of cancer prevention. - Encourage continuation of Weight Watchers program. - Encourage exercise for additional health benefits.   Subjective:    Outpatient Medications  Prior to Visit  Medication Sig Dispense Refill   calcium-vitamin D  250-100 MG-UNIT tablet Take 1 tablet by mouth daily. Taking calcium 600 mg plus vitamin D  once daily     Cholecalciferol (VITAMIN D3 PO) Take 1,000 mg by mouth daily.     olmesartan -hydrochlorothiazide  (BENICAR  HCT) 20-12.5 MG tablet Take 1 tablet by mouth daily. 90 tablet 1   oseltamivir  (TAMIFLU ) 75 MG capsule Take 1 capsule (75 mg total) by mouth daily. (Patient not taking: Reported on 11/12/2023) 10 capsule 0   OVER THE COUNTER MEDICATION      potassium chloride  (KLOR-CON ) 10 MEQ tablet Take 1 tablet (10 mEq total) by mouth daily. 90 tablet 1   nitrofurantoin , macrocrystal-monohydrate, (MACROBID ) 100 MG capsule Take 1 capsule (100 mg total) by mouth 2 (two) times daily. (Patient not taking: Reported on 11/12/2023) 10 capsule 0   No facility-administered medications prior to visit.   Past Medical History:  Diagnosis Date   Allergy    Breast cancer (HCC)    Breast cancer (HCC) 09/09/2017   Breast disorder    cancer   Class 1 obesity due to excess calories without serious comorbidity with body mass index (BMI) of 32.0 to 32.9 in adult 04/05/2020   Encounter for colorectal cancer screening 04/21/2019   Encounter for general adult medical examination with abnormal findings 09/27/2020   Encounter for gynecological examination with Papanicolaou smear of cervix 09/27/2020   Encounter for screening fecal occult blood testing 11/14/2021   Encounter for well woman exam with routine gynecological exam 04/21/2019   Endometriosis    Family history of breast cancer    Family history of heart disease in female family member before age 60 05/28/2017   Genetic testing 09/10/2017   Patient had genetic counseling for Hereditary Predisposition to cancer on 09/09/2017. The patient declined  genetic testing at this time.    Personal history of radiation therapy    Past Surgical History:  Procedure Laterality Date   BREAST BIOPSY Left 2017   benign    BREAST BIOPSY Left 08/27/2017   Needle core biopsy   BREAST LUMPECTOMY Left 10/19/2017   Procedure: LEFT BREAST RE EXCISION LUMPECTOMY;  Surgeon: Sim Dryer, MD;  Location: [facility];  Service: General;  Laterality: Left;   BREAST LUMPECTOMY WITH RADIOACTIVE SEED LOCALIZATION Left 09/22/2017   Procedure: BREAST LUMPECTOMY  WITH RADIOACTIVE SEED LOCALIZATION X 2;  Surgeon: Sim Dryer, MD;  Location: [facility];  Service: General;  Laterality: Left;   BREAST SURGERY N/A    [employer] 04/02/2020   TUBAL LIGATION  1999   [facility]   Allergies  Allergen Reactions   Lisinopril  Cough   Penicillins Other (See Comments)    UNSPECIFIED REACTION OF CHILDHOOD Has patient had a PCN reaction causing immediate rash, facial/tongue/throat swelling, SOB or lightheadedness with hypotension: Unknown Has patient had a PCN reaction causing severe rash involving mucus membranes or skin necrosis: Unknown Has patient had a PCN reaction that required hospitalization: Unknown Has patient had a PCN reaction occurring within the last 10 years: Unknown If all of the above answers are "NO", then may proceed with Cephalosporin use.    Azithromycin Itching    Itching of skin without rash      Objective:    Physical Exam Vitals and nursing note reviewed.  Constitutional:      Appearance: Normal appearance.  Cardiovascular:     Rate and Rhythm: Normal rate and regular rhythm.  Pulmonary:     Effort: Pulmonary effort is normal.     Breath sounds: Normal breath sounds.  Musculoskeletal:        General: Normal range of motion.  Skin:    General: Skin is warm and dry.  Neurological:     Mental Status: She is alert.  Psychiatric:        Mood and Affect: Mood normal.        Behavior: Behavior normal.    BP 100/68 (BP Location: Left Arm, Patient Position: Sitting, Cuff Size: Large)   Pulse 83   Temp 98.1 F (36.7 C) (Temporal)   Ht 5\' 2"  (1.575 m)   Wt 186 lb 4 oz (84.5 kg)   LMP  10/17/2023 (Exact Date)   SpO2 99%   BMI 34.07 kg/m  Wt Readings from Last 3 Encounters:  11/12/23 186 lb 4 oz (84.5 kg)  05/07/23 197 lb (89.4 kg)  03/05/23 195 lb (88.5 kg)       Versa Gore, NP

## 2023-11-12 NOTE — Assessment & Plan Note (Signed)
 Achieved eleven-pound weight loss since January. Blood pressure well-controlled. Encouraged continued weight loss and exercise for additional health benefits, including Alzheimer's/dementia & many types of cancer prevention. - Encourage continuation of Weight Watchers program. - Encourage exercise for additional health benefits.

## 2023-11-15 ENCOUNTER — Encounter: Payer: Self-pay | Admitting: Family

## 2023-12-17 ENCOUNTER — Ambulatory Visit: Admitting: Adult Health

## 2023-12-17 ENCOUNTER — Encounter: Payer: Self-pay | Admitting: Adult Health

## 2023-12-17 VITALS — BP 117/80 | HR 74 | Ht 63.0 in | Wt 187.0 lb

## 2023-12-17 DIAGNOSIS — Z1331 Encounter for screening for depression: Secondary | ICD-10-CM | POA: Diagnosis not present

## 2023-12-17 DIAGNOSIS — Z853 Personal history of malignant neoplasm of breast: Secondary | ICD-10-CM

## 2023-12-17 DIAGNOSIS — N951 Menopausal and female climacteric states: Secondary | ICD-10-CM

## 2023-12-17 DIAGNOSIS — Z01419 Encounter for gynecological examination (general) (routine) without abnormal findings: Secondary | ICD-10-CM

## 2023-12-17 NOTE — Progress Notes (Signed)
 Patient ID: Sara Freeman, female   DOB: [birth year], 53 y.o.   MRN: [id number] History of Present Illness: Sara Freeman is a 53 year old white female,married, G1P1001 in for a well woman gyn exam.     Component Value Date/Time   DIAGPAP  11/27/2022 1237    - Negative for intraepithelial lesion or malignancy (NILM)   DIAGPAP  11/14/2021 1329    - Negative for intraepithelial lesion or malignancy (NILM)   DIAGPAP  09/27/2020 1231    - Negative for intraepithelial lesion or malignancy (NILM)   HPVHIGH Negative 11/27/2022 1237   HPVHIGH Negative 11/14/2021 1329   HPVHIGH Negative 09/27/2020 1231   ADEQPAP  11/27/2022 1237    Satisfactory for evaluation; transformation zone component PRESENT.   ADEQPAP  11/14/2021 1329    Satisfactory for evaluation; transformation zone component PRESENT.   ADEQPAP  09/27/2020 1231    Satisfactory for evaluation; transformation zone component PRESENT.    PCP is Versa Gore NP   Current Medications, Allergies, Past Medical History, Past Surgical History, Family History and Social History were reviewed in [employer] record.     Review of Systems: Patient denies any headaches, hearing loss, fatigue, blurred vision, shortness of breath, chest pain, abdominal pain, problems with bowel movements, urination, or intercourse. No joint pain or mood swings.  Periods are still fairly regular, may have skipped April, was heavier after stopping tamoxifen  last year. She is doing [employer] and has lost 11 lbs, husband doing it too  Physical Exam:BP 117/80 (BP Location: Right Arm, Patient Position: Sitting, Cuff Size: Normal)   Pulse 74   Ht 5\' 3"  (1.6 m)   Wt 187 lb (84.8 kg)   LMP 12/05/2023 (Exact Date)   BMI 33.13 kg/m   General:  Well developed, well nourished, no acute distress Skin:  Warm and dry,tan Neck:  Midline trachea, normal thyroid , good ROM, no lymphadenopathy Lungs; Clear to auscultation bilaterally Breast:  No dominant palpable  mass, retraction, or nipple discharge Cardiovascular: Regular rate and rhythm Abdomen:  Soft, non tender, no hepatosplenomegaly Pelvic:  External genitalia is normal in appearance, no lesions.  The vagina is normal in appearance. Urethra has no lesions or masses. The cervix is smooth.  Uterus is felt to be normal size, shape, and contour.  No adnexal masses or tenderness noted.Bladder is non tender, no masses felt. Rectal: Deferred Extremities/musculoskeletal:  No swelling or varicosities noted, no clubbing or cyanosis Psych:  No mood changes, alert and cooperative,seems happy AA is 0 Fall risk is low    12/17/2023   10:26 AM 11/27/2022   12:13 PM 11/14/2021   12:38 PM  Depression screen PHQ 2/9  Decreased Interest 0 0 0  Down, Depressed, Hopeless 0 0 0  PHQ - 2 Score 0 0 0  Altered sleeping 0 0 0  Tired, decreased energy 0 0 0  Change in appetite 0 0 0  Feeling bad or failure about yourself  0 0 0  Trouble concentrating 0 0 0  Moving slowly or fidgety/restless 0 0 0  Suicidal thoughts 0 0 0  PHQ-9 Score 0 0 0       12/17/2023   10:26 AM 11/27/2022   12:14 PM 11/14/2021   12:38 PM 09/27/2020   12:23 PM  GAD 7 : Generalized Anxiety Score  Nervous, Anxious, on Edge 0 0 0 0  Control/stop worrying 0 0 0 0  Worry too much - different things 0 0 0 0  Trouble relaxing 0  0 0 0  Restless 0 0 0 0  Easily annoyed or irritable 0 0 0 0  Afraid - awful might happen 0 0 0 0  Total GAD 7 Score 0 0 0 0      Upstream - 12/17/23 1031       Pregnancy Intention Screening   Does the patient want to become pregnant in the next year? No    Does the patient's partner want to become pregnant in the next year? No    Would the patient like to discuss contraceptive options today? No      Contraception Wrap Up   Current Method Female Sterilization    End Method Female Sterilization    Contraception Counseling Provided No            Examination chaperoned by Alphonso Aschoff LPN   Impression and  Plan: 1. Encounter for well woman exam with routine gynecological exam (Primary) Physical in 1 year Can get pap next year if desired Mammogram was negative 09/24/23 Cologuard was negative this year Stay active   2. History of breast cancer Stopped tamoxifen  last year   3. Perimenopause Periods still fairly regular may have skipped April

## 2024-01-10 ENCOUNTER — Other Ambulatory Visit: Payer: Self-pay

## 2024-01-10 ENCOUNTER — Other Ambulatory Visit (HOSPITAL_COMMUNITY): Payer: Self-pay

## 2024-01-17 ENCOUNTER — Other Ambulatory Visit: Payer: Self-pay | Admitting: Family

## 2024-01-17 ENCOUNTER — Other Ambulatory Visit (HOSPITAL_COMMUNITY): Payer: Self-pay

## 2024-01-17 DIAGNOSIS — I10 Essential (primary) hypertension: Secondary | ICD-10-CM

## 2024-01-19 ENCOUNTER — Other Ambulatory Visit (HOSPITAL_COMMUNITY): Payer: Self-pay

## 2024-01-25 ENCOUNTER — Other Ambulatory Visit (HOSPITAL_COMMUNITY): Payer: Self-pay

## 2024-01-25 ENCOUNTER — Encounter: Payer: Self-pay | Admitting: Family

## 2024-01-26 ENCOUNTER — Other Ambulatory Visit: Payer: Self-pay

## 2024-01-26 ENCOUNTER — Other Ambulatory Visit (HOSPITAL_COMMUNITY): Payer: Self-pay

## 2024-01-26 ENCOUNTER — Telehealth: Payer: Self-pay

## 2024-01-26 DIAGNOSIS — E876 Hypokalemia: Secondary | ICD-10-CM

## 2024-01-26 MED ORDER — POTASSIUM CHLORIDE ER 10 MEQ PO TBCR
5.0000 meq | EXTENDED_RELEASE_TABLET | Freq: Every day | ORAL | 5 refills | Status: DC
  Filled 2024-01-26: qty 30, 60d supply, fill #0
  Filled 2024-03-26: qty 30, 60d supply, fill #1
  Filled 2024-05-01: qty 30, 60d supply, fill #2

## 2024-01-26 NOTE — Telephone Encounter (Signed)
 Let her know I sent a refill, but would like her to come in this week to recheck her potassium level - schedule a non-fasting, lab only appt please

## 2024-01-26 NOTE — Telephone Encounter (Signed)
 error

## 2024-02-28 ENCOUNTER — Other Ambulatory Visit: Payer: Self-pay

## 2024-02-28 ENCOUNTER — Telehealth: Admitting: Physician Assistant

## 2024-02-28 ENCOUNTER — Other Ambulatory Visit (HOSPITAL_COMMUNITY): Payer: Self-pay

## 2024-02-28 DIAGNOSIS — K0889 Other specified disorders of teeth and supporting structures: Secondary | ICD-10-CM | POA: Diagnosis not present

## 2024-02-28 MED ORDER — CLINDAMYCIN HCL 300 MG PO CAPS
300.0000 mg | ORAL_CAPSULE | Freq: Three times a day (TID) | ORAL | 0 refills | Status: AC
  Filled 2024-02-28: qty 21, 7d supply, fill #0

## 2024-02-28 NOTE — Progress Notes (Signed)
 E-Visit for Dental Pain  We are sorry that you are not feeling well.  Here is how we plan to help!  Based on what you have shared with me in the questionnaire, it sounds like you have an infection from your tooth  Clindamycin  300mg  3 times a day for 7 days  It is imperative that you see a dentist within 10 days of this eVisit to determine the cause of the dental pain and be sure it is adequately treated  A toothache or tooth pain is caused when the nerve in the root of a tooth or surrounding a tooth is irritated. Dental (tooth) infection, decay, injury, or loss of a tooth are the most common causes of dental pain. Pain may also occur after an extraction (tooth is pulled out). Pain sometimes originates from other areas and radiates to the jaw, thus appearing to be tooth pain.Bacteria growing inside your mouth can contribute to gum disease and dental decay, both of which can cause pain. A toothache occurs from inflammation of the central portion of the tooth called pulp. The pulp contains nerve endings that are very sensitive to pain. Inflammation to the pulp or pulpitis may be caused by dental cavities, trauma, and infection.    HOME CARE:   For toothaches: Over-the-counter pain medications such as acetaminophen  or ibuprofen  may be used. Take these as directed on the package while you arrange for a dental appointment. Avoid very cold or hot foods, because they may make the pain worse. You may get relief from biting on a cotton ball soaked in oil of cloves. You can get oil of cloves at most drug stores.  For jaw pain:  Aspirin may be helpful for problems in the joint of the jaw in adults. If pain happens every time you open your mouth widely, the temporomandibular joint (TMJ) may be the source of the pain. Yawning or taking a large bite of food may worsen the pain. An appointment with your doctor or dentist will help you find the cause.     GET HELP RIGHT AWAY IF:  You have a high fever or  chills If you have had a recent head or face injury and develop headache, light headedness, nausea, vomiting, or other symptoms that concern you after an injury to your face or mouth, you could have a more serious injury in addition to your dental injury. A facial rash associated with a toothache: This condition may improve with medication. Contact your doctor for them to decide what is appropriate. Any jaw pain occurring with chest pain: Although jaw pain is most commonly caused by dental disease, it is sometimes referred pain from other areas. People with heart disease, especially people who have had stents placed, people with diabetes, or those who have had heart surgery may have jaw pain as a symptom of heart attack or angina. If your jaw or tooth pain is associated with lightheadedness, sweating, or shortness of breath, you should see a doctor as soon as possible. Trouble swallowing or excessive pain or bleeding from gums: If you have a history of a weakened immune system, diabetes, or steroid use, you may be more susceptible to infections. Infections can often be more severe and extensive or caused by unusual organisms. Dental and gum infections in people with these conditions may require more aggressive treatment. An abscess may need draining or IV antibiotics, for example.  MAKE SURE YOU   Understand these instructions. Will watch your condition. Will get help right away  if you are not doing well or get worse.  Thank you for choosing an e-visit.  Your e-visit answers were reviewed by a board certified advanced clinical practitioner to complete your personal care plan. Depending upon the condition, your plan could have included both over the counter or prescription medications.  Please review your pharmacy choice. Make sure the pharmacy is open so you can pick up prescription now. If there is a problem, you may contact your provider through [employer] and have the prescription routed to  another pharmacy.  Your safety is important to us . If you have drug allergies check your prescription carefully.   For the next 24 hours you can use MyChart to ask questions about today's visit, request a non-urgent call back, or ask for a work or school excuse. You will get an email in the next two days asking about your experience. I hope that your e-visit has been valuable and will speed your recovery.  Approximately 5 minutes was spent documenting and reviewing patient's chart.

## 2024-02-29 ENCOUNTER — Other Ambulatory Visit (HOSPITAL_COMMUNITY): Payer: Self-pay

## 2024-03-27 ENCOUNTER — Other Ambulatory Visit: Payer: Self-pay

## 2024-05-01 ENCOUNTER — Other Ambulatory Visit (HOSPITAL_COMMUNITY): Payer: Self-pay

## 2024-05-02 ENCOUNTER — Other Ambulatory Visit: Payer: Self-pay

## 2024-05-12 ENCOUNTER — Encounter: Payer: Self-pay | Admitting: Family

## 2024-05-12 ENCOUNTER — Other Ambulatory Visit: Payer: Self-pay

## 2024-05-12 ENCOUNTER — Ambulatory Visit: Admitting: Family

## 2024-05-12 ENCOUNTER — Other Ambulatory Visit (HOSPITAL_COMMUNITY): Payer: Self-pay

## 2024-05-12 VITALS — BP 122/70 | HR 90 | Temp 97.8°F | Ht 63.0 in | Wt 190.5 lb

## 2024-05-12 DIAGNOSIS — I10 Essential (primary) hypertension: Secondary | ICD-10-CM | POA: Diagnosis not present

## 2024-05-12 DIAGNOSIS — E876 Hypokalemia: Secondary | ICD-10-CM | POA: Diagnosis not present

## 2024-05-12 DIAGNOSIS — E669 Obesity, unspecified: Secondary | ICD-10-CM

## 2024-05-12 MED ORDER — OLMESARTAN MEDOXOMIL 5 MG PO TABS
10.0000 mg | ORAL_TABLET | Freq: Every day | ORAL | 1 refills | Status: AC
  Filled 2024-05-12: qty 180, 90d supply, fill #0
  Filled 2024-08-06: qty 180, 90d supply, fill #1

## 2024-05-12 NOTE — Progress Notes (Signed)
 Patient ID: Sara Freeman, female    DOB: [birth year], 53 y.o.   MRN: [id number]  Chief Complaint  Patient presents with   Hypertension    Pt did take BP medication this morning around 5:30 am.   Discussed the use of AI scribe software for clinical note transcription with the patient, who gave verbal consent to proceed.  History of Present Illness Sara Freeman is a 53 year old female with hypertension who presents for medication management.  Her blood pressure is currently 122/70 mmHg. She takes olmesartan  and hydrochlorothiazide , half a tablet of 20 mg/12.5 mg every morning. There is no recent ankle swelling. Potassium supplementation is taken at half the prescribed dose due to previously low potassium levels. She is involved in [employer], having previously lost weight to 184 pounds but has regained some weight. She is working on improving her weight management. She uses a glucose meter and observes blood sugar spikes with white rice or white bread, leading her to limit these foods.  Assessment & Plan Essential hypertension Blood pressure controlled at 122/70 mmHg. Plan to simplify regimen by discontinuing hydrochlorothiazide , reducing costs and eliminating potassium supplementation need. - Discontinue Olmesartan -hydrochlorothiazide . - Prescribe Olmesartan  5 mg tablets, instruct to take two tablets daily. - Schedule lab work in one month to monitor potassium levels. - Advise to keep remaining hydrochlorothiazide  and potassium on hand in case of emergency. - F/U in 6 mos with full fasting labs  Hypokalemia Managed with potassium supplementation due to hydrochlorothiazide . Discontinuation of hydrochlorothiazide  should eliminate supplementation need. Monitor potassium levels post-medication change. - Discontinue potassium supplementation. - Schedule lab work in one month to monitor potassium level.  Obesity Using [employer] APP on her phone and following plan. Got off course  recently after losing about 6lbs and has regained, but working on the plan again. - Continue working on weight loss with [employer] - Remember to incorporate cardio exercise up to 30 min as many days as able.  Subjective:    Outpatient Medications Prior to Visit  Medication Sig Dispense Refill   Acetaminophen  (TYLENOL  PO) Take by mouth.     calcium-vitamin D  250-100 MG-UNIT tablet Take 1 tablet by mouth daily. Taking calcium 600 mg plus vitamin D  once daily     Chlorpheniramine Maleate (ALLERGY PO) Take by mouth.     Cholecalciferol (VITAMIN D3 PO) Take 1,000 mg by mouth daily.     IBUPROFEN  PO Take by mouth.     Melatonin-Black Cohosh-Soy (ESTROVEN SLEEP COOL PO) Take by mouth.     Naproxen Sodium (ALEVE PO) Take by mouth.     olmesartan -hydrochlorothiazide  (BENICAR  HCT) 20-12.5 MG tablet Take 0.5 tablets by mouth every morning. 90 tablet 1   potassium chloride  (KLOR-CON ) 10 MEQ tablet Take 0.5 tablets (5 mEq total) by mouth daily. 30 tablet 5   OVER THE COUNTER MEDICATION [place] menopause relief sleep cool     No facility-administered medications prior to visit.   Past Medical History:  Diagnosis Date   Allergy    Breast cancer (HCC)    Breast cancer (HCC) 09/09/2017   Breast disorder    cancer   Class 1 obesity due to excess calories without serious comorbidity with body mass index (BMI) of 32.0 to 32.9 in adult 04/05/2020   Encounter for colorectal cancer screening 04/21/2019   Encounter for general adult medical examination with abnormal findings 09/27/2020   Encounter for gynecological examination with Papanicolaou smear of cervix 09/27/2020   Encounter for screening fecal  occult blood testing 11/14/2021   Encounter for well woman exam with routine gynecological exam 04/21/2019   Endometriosis    Family history of breast cancer    Family history of heart disease in female family member before age 80 05/28/2017   Genetic testing 09/10/2017   Patient had genetic counseling for  Hereditary Predisposition to cancer on 09/09/2017. The patient declined genetic testing at this time.    Personal history of radiation therapy    Past Surgical History:  Procedure Laterality Date   BREAST BIOPSY Left 2017   benign   BREAST BIOPSY Left 08/27/2017   Needle core biopsy   BREAST LUMPECTOMY Left 10/19/2017   Procedure: LEFT BREAST RE EXCISION LUMPECTOMY;  Surgeon: Vanderbilt Ned, MD;  Location: [facility];  Service: General;  Laterality: Left;   BREAST LUMPECTOMY WITH RADIOACTIVE SEED LOCALIZATION Left 09/22/2017   Procedure: BREAST LUMPECTOMY  WITH RADIOACTIVE SEED LOCALIZATION X 2;  Surgeon: Vanderbilt Ned, MD;  Location: [facility] OR;  Service: General;  Laterality: Left;   BREAST SURGERY N/A    [employer] 04/02/2020   TUBAL LIGATION  1999   [place]   Allergies  Allergen Reactions   Lisinopril  Cough   Penicillins Other (See Comments)    UNSPECIFIED REACTION OF CHILDHOOD Has patient had a PCN reaction causing immediate rash, facial/tongue/throat swelling, SOB or lightheadedness with hypotension: Unknown Has patient had a PCN reaction causing severe rash involving mucus membranes or skin necrosis: Unknown Has patient had a PCN reaction that required hospitalization: Unknown Has patient had a PCN reaction occurring within the last 10 years: Unknown If all of the above answers are NO, then may proceed with Cephalosporin use.    Azithromycin Itching    Itching of skin without rash      Objective:    Physical Exam Vitals and nursing note reviewed.  Constitutional:      Appearance: Normal appearance.  Cardiovascular:     Rate and Rhythm: Normal rate and regular rhythm.  Pulmonary:     Effort: Pulmonary effort is normal.     Breath sounds: Normal breath sounds.  Musculoskeletal:        General: Normal range of motion.  Skin:    General: Skin is warm and dry.  Neurological:     Mental Status: She is alert.  Psychiatric:        Mood and Affect: Mood  normal.        Behavior: Behavior normal.    BP 122/70 (BP Location: Left Arm, Patient Position: Sitting, Cuff Size: Large)   Pulse 90   Temp 97.8 F (36.6 C) (Temporal)   Ht 5' 3 (1.6 m)   Wt 190 lb 8 oz (86.4 kg)   LMP 03/15/2024 (Approximate)   SpO2 98%   BMI 33.75 kg/m  Wt Readings from Last 3 Encounters:  05/12/24 190 lb 8 oz (86.4 kg)  12/17/23 187 lb (84.8 kg)  11/12/23 186 lb 4 oz (84.5 kg)     Lucius Krabbe, NP

## 2024-05-12 NOTE — Patient Instructions (Signed)
 It was very nice to see you today!   Stop your current BP med with Hydrochlorothiazide  and the Potassium pill. Start taking the new Olmesartan  10mg  dose every day = 2 5mg  pills. Check blood work in 1 month, non- fasting. See me again in 6mos.  Have a fabulous weekend!      PLEASE NOTE:  If you had any lab tests please let us  know if you have not heard back within a few days. You may see your results on MyChart before we have a chance to review them but we will give you a call once they are reviewed by us . If we ordered any referrals today, please let us  know if you have not heard from their office within the next week.

## 2024-05-16 ENCOUNTER — Telehealth: Admitting: Family Medicine

## 2024-05-16 DIAGNOSIS — K047 Periapical abscess without sinus: Secondary | ICD-10-CM

## 2024-05-16 NOTE — Progress Notes (Signed)
 Because you were recently treated for dental infection you need to be seen in person this time. Your condition warrants further evaluation and recommend that you be seen in a face-to-face visit at a pcp office or local urgent care.   NOTE: There will be NO CHARGE for this E-Visit   If you are having a true medical emergency, please call 911.

## 2024-06-09 ENCOUNTER — Other Ambulatory Visit

## 2024-06-30 ENCOUNTER — Other Ambulatory Visit

## 2024-06-30 ENCOUNTER — Ambulatory Visit: Payer: Self-pay | Admitting: Family

## 2024-06-30 DIAGNOSIS — E876 Hypokalemia: Secondary | ICD-10-CM | POA: Diagnosis not present

## 2024-06-30 LAB — BASIC METABOLIC PANEL WITH GFR
BUN: 15 mg/dL (ref 6–23)
CO2: 30 meq/L (ref 19–32)
Calcium: 10.3 mg/dL (ref 8.4–10.5)
Chloride: 101 meq/L (ref 96–112)
Creatinine, Ser: 0.79 mg/dL (ref 0.40–1.20)
GFR: 85.61 mL/min (ref >60.00)
Glucose, Bld: 105 mg/dL — ABNORMAL HIGH (ref 70–99)
Potassium: 4.2 meq/L (ref 3.5–5.1)
Sodium: 139 meq/L (ref 135–145)

## 2024-08-07 ENCOUNTER — Other Ambulatory Visit: Payer: Self-pay

## 2024-08-07 ENCOUNTER — Other Ambulatory Visit (HOSPITAL_COMMUNITY): Payer: Self-pay

## 2024-08-10 ENCOUNTER — Other Ambulatory Visit: Payer: Self-pay | Admitting: Family

## 2024-08-10 DIAGNOSIS — Z1231 Encounter for screening mammogram for malignant neoplasm of breast: Secondary | ICD-10-CM

## 2024-09-29 ENCOUNTER — Ambulatory Visit

## 2024-11-17 ENCOUNTER — Ambulatory Visit: Admitting: Family

## 2024-12-18 ENCOUNTER — Ambulatory Visit: Admitting: Adult Health
